# Patient Record
Sex: Female | Born: 1990 | Race: Black or African American | Hispanic: No | Marital: Single | Smoking: Never smoker
Health system: Southern US, Community
[De-identification: ages and names within clinical notes are randomized; demographics above are authoritative.]

## PROBLEM LIST (undated history)

## (undated) DIAGNOSIS — A749 Chlamydial infection, unspecified: Secondary | ICD-10-CM

## (undated) DIAGNOSIS — O139 Gestational [pregnancy-induced] hypertension without significant proteinuria, unspecified trimester: Secondary | ICD-10-CM

## (undated) DIAGNOSIS — N760 Acute vaginitis: Secondary | ICD-10-CM

## (undated) DIAGNOSIS — O24419 Gestational diabetes mellitus in pregnancy, unspecified control: Secondary | ICD-10-CM

## (undated) DIAGNOSIS — D649 Anemia, unspecified: Secondary | ICD-10-CM

## (undated) DIAGNOSIS — O009 Unspecified ectopic pregnancy without intrauterine pregnancy: Secondary | ICD-10-CM

## (undated) DIAGNOSIS — B9689 Other specified bacterial agents as the cause of diseases classified elsewhere: Secondary | ICD-10-CM

## (undated) HISTORY — DX: Other specified bacterial agents as the cause of diseases classified elsewhere: B96.89

## (undated) HISTORY — DX: Anemia, unspecified: D64.9

## (undated) HISTORY — DX: Gestational (pregnancy-induced) hypertension without significant proteinuria, unspecified trimester: O13.9

## (undated) HISTORY — PX: CYST EXCISION: SHX5701

## (undated) HISTORY — DX: Chlamydial infection, unspecified: A74.9

## (undated) HISTORY — PX: NO PAST SURGERIES: SHX2092

## (undated) HISTORY — DX: Unspecified ectopic pregnancy without intrauterine pregnancy: O00.90

## (undated) HISTORY — DX: Acute vaginitis: N76.0

## (undated) HISTORY — PX: WISDOM TOOTH EXTRACTION: SHX21

---
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
## 2008-02-19 ENCOUNTER — Emergency Department: Payer: Self-pay | Admitting: Emergency Medicine

## 2008-05-07 ENCOUNTER — Emergency Department: Payer: Self-pay | Admitting: Emergency Medicine

## 2009-07-13 ENCOUNTER — Emergency Department (HOSPITAL_COMMUNITY): Admission: EM | Admit: 2009-07-13 | Discharge: 2009-07-13 | Payer: Self-pay | Admitting: Emergency Medicine

## 2009-09-03 ENCOUNTER — Emergency Department: Payer: Self-pay | Admitting: Emergency Medicine

## 2009-10-26 ENCOUNTER — Emergency Department: Payer: Self-pay | Admitting: Unknown Physician Specialty

## 2009-11-19 ENCOUNTER — Emergency Department: Payer: Self-pay | Admitting: Unknown Physician Specialty

## 2012-08-26 ENCOUNTER — Emergency Department: Payer: Self-pay | Admitting: Emergency Medicine

## 2017-03-31 ENCOUNTER — Emergency Department
Admission: EM | Admit: 2017-03-31 | Discharge: 2017-04-01 | Disposition: A | Discharge status: Home / Self Care | Payer: Self-pay | Attending: Emergency Medicine | Admitting: Emergency Medicine

## 2017-03-31 ENCOUNTER — Encounter: Payer: Self-pay | Admitting: Emergency Medicine

## 2017-03-31 DIAGNOSIS — B9689 Other specified bacterial agents as the cause of diseases classified elsewhere: Secondary | ICD-10-CM | POA: Insufficient documentation

## 2017-03-31 DIAGNOSIS — N760 Acute vaginitis: Secondary | ICD-10-CM | POA: Insufficient documentation

## 2017-03-31 NOTE — ED Triage Notes (Signed)
Patient with complaint of vaginal irritation times two days. Patient states that she used monistat with no improvement.

## 2017-04-01 ENCOUNTER — Encounter: Payer: Self-pay | Admitting: Physician Assistant

## 2017-04-01 ENCOUNTER — Telehealth: Payer: Self-pay | Admitting: Emergency Medicine

## 2017-04-01 LAB — WET PREP, GENITAL
SPERM: NONE SEEN
TRICH WET PREP: NONE SEEN
YEAST WET PREP: NONE SEEN

## 2017-04-01 LAB — URINALYSIS, COMPLETE (UACMP) WITH MICROSCOPIC
Bacteria, UA: NONE SEEN
Bilirubin Urine: NEGATIVE
Glucose, UA: NEGATIVE mg/dL
Hgb urine dipstick: NEGATIVE
Ketones, ur: NEGATIVE mg/dL
Leukocytes, UA: NEGATIVE
Nitrite: NEGATIVE
Protein, ur: NEGATIVE mg/dL
Specific Gravity, Urine: 1.018 (ref 1.005–1.030)
pH: 7 (ref 5.0–8.0)

## 2017-04-01 LAB — CHLAMYDIA/NGC RT PCR (ARMC ONLY)
Chlamydia Tr: NOT DETECTED
N gonorrhoeae: NOT DETECTED

## 2017-04-01 MED ORDER — METRONIDAZOLE 500 MG PO TABS: 500.0000 mg | ORAL_TABLET | Freq: Two times a day (BID) | ORAL | 0 refills | Status: DC

## 2017-04-01 MED ORDER — METRONIDAZOLE 500 MG PO TABS
500.0000 mg | ORAL_TABLET | Freq: Once | ORAL | Status: AC
  Administered 2017-04-01: 500 mg via ORAL
  Filled 2017-04-01: qty 1

## 2017-04-01 NOTE — Telephone Encounter (Signed)
Patient called for std test results.  I gave her results.  Explained that she still needs to take the flagyl.  She says she will get that filled.

## 2017-04-01 NOTE — Discharge Instructions (Signed)
You are being treated for BV, it is a common condition in females, not considered to be a sexually transmitted infection. Take the prescription meds as directed. Do not drink alcohol for a week, while on this medicine. You may call back to confirm any remaining test results.

## 2017-04-01 NOTE — ED Provider Notes (Signed)
[facility] Emergency Department Provider Note ____________________________________________  Time seen: 0010  I have reviewed the triage vital signs and the nursing notes.  HISTORY  Chief Complaint  Vaginal Itching  HPI Alicia Terry is a 27 y.o. female sent to the ED for vaginal itching and irritation.  Patient has been using Monistat vaginal cream without any benefit.  She presents today for further evaluation and management patient denies any pelvic pain, abnormal vaginal bleeding, or vaginal discharge.  History reviewed. No pertinent past medical history.  There are no active problems to display for this patient.   History reviewed. No pertinent surgical history.  Prior to Admission medications   Medication Sig Start Date End Date Taking? Authorizing Provider  metroNIDAZOLE (FLAGYL) 500 MG tablet Take 1 tablet (500 mg total) by mouth 2 (two) times daily for 7 days. 04/01/17 04/08/17  Saharra Santo, Dannielle Karvonen, PA-C    Allergies Patient has no known allergies.  No family history on file.  Social History Social History   Tobacco Use  . Smoking status: Never Smoker  . Smokeless tobacco: Never Used  Substance Use Topics  . Alcohol use: Yes  . Drug use: No    Review of Systems  Constitutional: Negative for fever. Cardiovascular: Negative for chest pain. Respiratory: Negative for shortness of breath. Gastrointestinal: Negative for abdominal pain, vomiting and diarrhea. Genitourinary: Negative for dysuria. Vulvar irritation as above.  Musculoskeletal: Negative for back pain. Skin: Negative for rash. Neurological: Negative for headaches, focal weakness or numbness. ____________________________________________  PHYSICAL EXAM:  VITAL SIGNS: ED Triage Vitals [03/31/17 2344]  Enc Vitals Group     BP 137/76     Pulse Rate 74     Resp 18     Temp 97.8 F (36.6 C)     Temp Source Oral     SpO2 98 %     Weight 190 lb (86.2 kg)     Height 5'  8" (1.727 m)     Head Circumference      Peak Flow      Pain Score      Pain Loc      Pain Edu?      Excl. in [place]?     Constitutional: Alert and oriented. Well appearing and in no distress. Head: Normocephalic and atraumatic. Cardiovascular: Normal rate, regular rhythm. Normal distal pulses. Respiratory: Normal respiratory effort. No wheezes/rales/rhonchi. GU: Normal external genitalia. Residual vaginal cream in the cul de sac. Endocervical polyp noted in the os. No CMT or adnexal masses noted.  Musculoskeletal: Nontender with normal range of motion in all extremities.  Neurologic:  Normal gait without ataxia. Normal speech and language. No gross focal neurologic deficits are appreciated. Skin:  Skin is warm, dry and intact. No rash noted. Psychiatric: Mood and affect are normal. Patient exhibits appropriate insight and judgment. ____________________________________________   LABS (pertinent positives/negatives)  Labs Reviewed  WET PREP, GENITAL - Abnormal; Notable for the following components:      Result Value   Clue Cells Wet Prep HPF POC PRESENT (*)    WBC, Wet Prep HPF POC FEW (*)    All other components within normal limits  URINALYSIS, COMPLETE (UACMP) WITH MICROSCOPIC - Abnormal; Notable for the following components:   Color, Urine YELLOW (*)    APPearance CLEAR (*)    Squamous Epithelial / LPF 0-5 (*)    All other components within normal limits  CHLAMYDIA/NGC RT PCR ([facility] ONLY)  POC URINE PREG, ED  ____________________________________________  PROCEDURES  Procedures Flagyl 500 mg PO ____________________________________________  INITIAL IMPRESSION / ASSESSMENT AND PLAN / ED COURSE  Female patient with ED evaluation of vulvar irritation and itching.  She is failed a 2-day course of over-the-counter intravaginal yeast treatment.  Her wet prep confirms clue cells consistent with BV.  She will be discharged with a prescription for Flagyl.  She will follow-up  with [facility] or return to the ED as needed. ____________________________________________  FINAL CLINICAL IMPRESSION(S) / ED DIAGNOSES  Final diagnoses:  BV (bacterial vaginosis)      Suzanne Garbers, Dannielle Karvonen, PA-C 04/01/17 0100    Arta Silence, MD 04/02/17 2213

## 2017-04-01 NOTE — ED Notes (Signed)
POCT Results were NEGATIVE

## 2017-04-04 LAB — POCT PREGNANCY, URINE: Preg Test, Ur: NEGATIVE

## 2017-07-31 ENCOUNTER — Emergency Department: Payer: Self-pay

## 2017-07-31 ENCOUNTER — Emergency Department
Admission: EM | Admit: 2017-07-31 | Discharge: 2017-07-31 | Disposition: A | Discharge status: Home / Self Care | Payer: Self-pay | Attending: Emergency Medicine | Admitting: Emergency Medicine

## 2017-07-31 ENCOUNTER — Other Ambulatory Visit: Payer: Self-pay

## 2017-07-31 DIAGNOSIS — Y939 Activity, unspecified: Secondary | ICD-10-CM | POA: Insufficient documentation

## 2017-07-31 DIAGNOSIS — Y999 Unspecified external cause status: Secondary | ICD-10-CM | POA: Insufficient documentation

## 2017-07-31 DIAGNOSIS — W3400XA Accidental discharge from unspecified firearms or gun, initial encounter: Secondary | ICD-10-CM | POA: Insufficient documentation

## 2017-07-31 DIAGNOSIS — Z23 Encounter for immunization: Secondary | ICD-10-CM | POA: Insufficient documentation

## 2017-07-31 DIAGNOSIS — Y92513 Shop (commercial) as the place of occurrence of the external cause: Secondary | ICD-10-CM | POA: Insufficient documentation

## 2017-07-31 DIAGNOSIS — S81831A Puncture wound without foreign body, right lower leg, initial encounter: Secondary | ICD-10-CM | POA: Insufficient documentation

## 2017-07-31 LAB — POCT PREGNANCY, URINE: Preg Test, Ur: NEGATIVE

## 2017-07-31 MED ORDER — HYDROCODONE-ACETAMINOPHEN 5-325 MG PO TABS: 1.0000 | ORAL_TABLET | Freq: Four times a day (QID) | ORAL | 0 refills | Status: DC | PRN

## 2017-07-31 MED ORDER — TETANUS-DIPHTH-ACELL PERTUSSIS 5-2.5-18.5 LF-MCG/0.5 IM SUSP
0.5000 mL | Freq: Once | INTRAMUSCULAR | Status: AC
  Administered 2017-07-31: 0.5 mL via INTRAMUSCULAR
  Filled 2017-07-31: qty 0.5

## 2017-07-31 MED ORDER — LIDOCAINE-EPINEPHRINE 1 %-1:100000 IJ SOLN
10.0000 mL | Freq: Once | INTRAMUSCULAR | Status: AC
  Administered 2017-07-31: 1 mL via INTRADERMAL
  Filled 2017-07-31: qty 10
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
## 2017-11-17 ENCOUNTER — Encounter: Payer: Self-pay | Admitting: Emergency Medicine

## 2017-11-17 ENCOUNTER — Emergency Department
Admission: EM | Admit: 2017-11-17 | Discharge: 2017-11-17 | Disposition: A | Discharge status: Home / Self Care | Payer: Self-pay | Attending: Emergency Medicine | Admitting: Emergency Medicine

## 2017-11-17 ENCOUNTER — Other Ambulatory Visit: Payer: Self-pay

## 2017-11-17 DIAGNOSIS — B9689 Other specified bacterial agents as the cause of diseases classified elsewhere: Secondary | ICD-10-CM | POA: Insufficient documentation

## 2017-11-17 DIAGNOSIS — B3731 Acute candidiasis of vulva and vagina: Secondary | ICD-10-CM

## 2017-11-17 DIAGNOSIS — B373 Candidiasis of vulva and vagina: Secondary | ICD-10-CM

## 2017-11-17 DIAGNOSIS — N760 Acute vaginitis: Secondary | ICD-10-CM | POA: Insufficient documentation

## 2017-11-17 LAB — URINALYSIS, COMPLETE (UACMP) WITH MICROSCOPIC
Bacteria, UA: NONE SEEN
Bilirubin Urine: NEGATIVE
GLUCOSE, UA: NEGATIVE mg/dL
HGB URINE DIPSTICK: NEGATIVE
KETONES UR: NEGATIVE mg/dL
Nitrite: NEGATIVE
PH: 6 (ref 5.0–8.0)
Protein, ur: NEGATIVE mg/dL
Specific Gravity, Urine: 1.023 (ref 1.005–1.030)

## 2017-11-17 LAB — CHLAMYDIA/NGC RT PCR (ARMC ONLY)
Chlamydia Tr: DETECTED — AB
N GONORRHOEAE: NOT DETECTED

## 2017-11-17 LAB — WET PREP, GENITAL
Sperm: NONE SEEN
Trich, Wet Prep: NONE SEEN

## 2017-11-17 LAB — POCT PREGNANCY, URINE: Preg Test, Ur: NEGATIVE

## 2017-11-17 MED ORDER — FLUCONAZOLE 150 MG PO TABS: 150.0000 mg | ORAL_TABLET | Freq: Every day | ORAL | 0 refills | Status: DC

## 2017-11-17 MED ORDER — METRONIDAZOLE 500 MG PO TABS: 500.0000 mg | ORAL_TABLET | Freq: Two times a day (BID) | ORAL | 0 refills | Status: DC

## 2017-11-17 NOTE — ED Notes (Signed)
Pt reports that she attempted a vaginal cleansing and after that she feels pain and urgency with urination - she is also only able to void small amounts at a time

## 2017-11-17 NOTE — ED Triage Notes (Signed)
Pt sat over a "organic steam" that is supposed to cleanse you 2 days ago.  Since then her "vagina is not right" but is unable to describe further.  Did report went to used restroom and only urinated a little so thinks may have UTI.  Only pain is irritation to vagina.

## 2017-11-17 NOTE — ED Provider Notes (Signed)
[facility] Emergency Department Provider Note   ____________________________________________   First MD Initiated Contact with Patient 11/17/17 [phone]     (approximate)  I have reviewed the triage vital signs and the nursing notes.   HISTORY  Chief Complaint "vagina not normal"   HPI Alicia Terry is a 27 y.o. female presents to the ED with complaint of "vagina not right".  Patient reports that she has had irritation and occasional itching with a small amount of discharge.  Patient states that she used a "organic steam" that she sat over to cleanse her vagina 2 days ago.  Patient states that the ingredients contained orange peel and "all organic stuff" that was mixed into water and boiled until steaming.  She denies any urinary symptoms.  Is been no nausea or vomiting.  Currently she rates her discomfort as a 7 out of 10.   History reviewed. No pertinent past medical history.  There are no active problems to display for this patient.   History reviewed. No pertinent surgical history.  Prior to Admission medications   Medication Sig Start Date End Date Taking? Authorizing Provider  fluconazole (DIFLUCAN) 150 MG tablet Take 1 tablet (150 mg total) by mouth daily. 11/17/17   Johnn Hai, PA-C  metroNIDAZOLE (FLAGYL) 500 MG tablet Take 1 tablet (500 mg total) by mouth 2 (two) times daily. 11/17/17   Johnn Hai, PA-C    Allergies Patient has no known allergies.  History reviewed. No pertinent family history.  Social History Social History   Tobacco Use  . Smoking status: Never Smoker  . Smokeless tobacco: Never Used  Substance Use Topics  . Alcohol use: Yes  . Drug use: No    Review of Systems Constitutional: No fever/chills Cardiovascular: Denies chest pain. Respiratory: Denies shortness of breath. Gastrointestinal: No abdominal pain.  No nausea, no vomiting.  Genitourinary: Negative for dysuria.  Positive vaginal  irritation. Musculoskeletal: Negative for back pain. Skin: Negative for rash. Neurological: Negative for headaches, focal weakness or numbness. ____________________________________________   PHYSICAL EXAM:  VITAL SIGNS: ED Triage Vitals  Enc Vitals Group     BP 11/17/17 0929 (!) 149/103     Pulse Rate 11/17/17 0929 76     Resp 11/17/17 0929 18     Temp 11/17/17 0929 98.4 F (36.9 C)     Temp Source 11/17/17 0929 Oral     SpO2 11/17/17 0929 100 %     Weight 11/17/17 0930 190 lb (86.2 kg)     Height 11/17/17 0930 5\' 8"  (1.727 m)     Head Circumference --      Peak Flow --      Pain Score 11/17/17 0929 7     Pain Loc --      Pain Edu? --      Excl. in [place]? --    Constitutional: Alert and oriented. Well appearing and in no acute distress. Eyes: Conjunctivae are normal.  Head: Atraumatic. Nose: No congestion/rhinnorhea. Neck: No stridor.   Cardiovascular: Normal rate, regular rhythm. Grossly normal heart sounds.  Good peripheral circulation. Respiratory: Normal respiratory effort.  No retractions. Lungs CTAB. Gastrointestinal: Soft and nontender. No distention. Genitourinary: External genitalia has white thick vaginal secretions.  Vaginal wall contains white thick exudate.  Cervix is without erythema.  Cultures and wet prep were obtained.  No adnexal masses or tenderness is noted.  No chandelier sign present. Musculoskeletal: No lower extremity tenderness nor edema.  No joint effusions. Neurologic:  Normal speech and language. No gross focal neurologic deficits are appreciated. No gait instability. Skin:  Skin is warm, dry and intact. No rash noted. Psychiatric: Mood and affect are normal. Speech and behavior are normal.  ____________________________________________   LABS (all labs ordered are listed, but only abnormal results are displayed)  Labs Reviewed  WET PREP, GENITAL - Abnormal; Notable for the following components:      Result Value   Yeast Wet Prep HPF POC  PRESENT (*)    Clue Cells Wet Prep HPF POC PRESENT (*)    WBC, Wet Prep HPF POC MANY (*)    All other components within normal limits  URINALYSIS, COMPLETE (UACMP) WITH MICROSCOPIC - Abnormal; Notable for the following components:   Color, Urine YELLOW (*)    APPearance HAZY (*)    Leukocytes, UA MODERATE (*)    All other components within normal limits  CHLAMYDIA/NGC RT PCR ([facility] ONLY)  POC URINE PREG, ED  POCT PREGNANCY, URINE     PROCEDURES  Procedure(s) performed: None  Procedures  Critical Care performed: No  ____________________________________________   INITIAL IMPRESSION / ASSESSMENT AND PLAN / ED COURSE  As part of my medical decision making, I reviewed the following data within the electronic MEDICAL RECORD NUMBER Notes from prior ED visits and [place] Controlled Substance Database  Patient presents to the ED with complaint of vaginal irritation and discharge.  On exam there is noticeable discharge present.  Wet prep shows clue cells and yeast.  Patient was placed on Flagyl 500 mg twice daily for 7 days and Diflucan 150 mg 1 for yeast.  Patient was told that should her cultures come back positive she will be called.  At the time of discharge this test was still pending.  ____________________________________________   FINAL CLINICAL IMPRESSION(S) / ED DIAGNOSES  Final diagnoses:  Bacterial vaginosis  Yeast vaginitis     ED Discharge Orders         Ordered    metroNIDAZOLE (FLAGYL) 500 MG tablet  2 times daily     11/17/17 1101    fluconazole (DIFLUCAN) 150 MG tablet  Daily,   Status:  Discontinued     11/17/17 1101    fluconazole (DIFLUCAN) 150 MG tablet  Daily     11/17/17 1102           Note:  This document was prepared using Dragon voice recognition software and may include unintentional dictation errors.    Johnn Hai, PA-C 11/17/17 1123    Lisa Roca, MD 11/17/17 1239

## 2017-11-17 NOTE — Discharge Instructions (Signed)
Follow-up with your primary care provider or the health department if any continued vaginal problems.  Your medication was sent to [facility] on [address].  Take Diflucan for the yeast infection.  Flagyl is twice a day for 7 days for the bacterial vaginosis.  Do not drink alcohol with this medication as it will cause severe vomiting.  Discontinue the organic stain is this may have contributed to your yeast infection.

## 2017-11-17 NOTE — ED Notes (Signed)
This tech tried to print stickers for samples and bracelet would not scan

## 2017-11-18 ENCOUNTER — Telehealth: Payer: Self-pay | Admitting: Emergency Medicine

## 2017-11-18 NOTE — Telephone Encounter (Signed)
Called patient to notify patient of positive chlamydia test and need for treatment.  Per dr Clearnce Hasten can call in azithromycin 1 gram po.  Patient informed and called in rx to [facility] [address].  Also informed of partner treatment and free treatment at [facility].

## 2018-08-05 ENCOUNTER — Encounter: Payer: Self-pay | Admitting: Emergency Medicine

## 2018-08-05 ENCOUNTER — Other Ambulatory Visit: Payer: Self-pay

## 2018-08-05 ENCOUNTER — Emergency Department
Admission: EM | Admit: 2018-08-05 | Discharge: 2018-08-05 | Disposition: A | Discharge status: Home / Self Care | Payer: Medicaid Other | Attending: Emergency Medicine | Admitting: Emergency Medicine

## 2018-08-05 ENCOUNTER — Telehealth: Payer: Self-pay | Admitting: Obstetrics & Gynecology

## 2018-08-05 ENCOUNTER — Emergency Department: Payer: Medicaid Other

## 2018-08-05 DIAGNOSIS — R102 Pelvic and perineal pain: Secondary | ICD-10-CM | POA: Insufficient documentation

## 2018-08-05 DIAGNOSIS — Z3201 Encounter for pregnancy test, result positive: Secondary | ICD-10-CM | POA: Diagnosis not present

## 2018-08-05 DIAGNOSIS — R1030 Lower abdominal pain, unspecified: Secondary | ICD-10-CM | POA: Diagnosis present

## 2018-08-05 LAB — URINALYSIS, COMPLETE (UACMP) WITH MICROSCOPIC
Bacteria, UA: NONE SEEN
Bilirubin Urine: NEGATIVE
Glucose, UA: NEGATIVE mg/dL
Hgb urine dipstick: NEGATIVE
Ketones, ur: NEGATIVE mg/dL
Leukocytes,Ua: NEGATIVE
Nitrite: NEGATIVE
Protein, ur: NEGATIVE mg/dL
Specific Gravity, Urine: 1.024 (ref 1.005–1.030)
pH: 5 (ref 5.0–8.0)

## 2018-08-05 LAB — CBC
HCT: 38.9 % (ref 36.0–46.0)
Hemoglobin: 12.7 g/dL (ref 12.0–15.0)
MCH: 27.7 pg (ref 26.0–34.0)
MCHC: 32.6 g/dL (ref 30.0–36.0)
MCV: 84.9 fL (ref 80.0–100.0)
Platelets: 251 10*3/uL (ref 150–400)
RBC: 4.58 MIL/uL (ref 3.87–5.11)
RDW: 12.4 % (ref 11.5–15.5)
WBC: 4.8 10*3/uL (ref 4.0–10.5)
nRBC: 0 % (ref 0.0–0.2)

## 2018-08-05 LAB — COMPREHENSIVE METABOLIC PANEL
ALT: 15 U/L (ref 0–44)
AST: 17 U/L (ref 15–41)
Albumin: 4.4 g/dL (ref 3.5–5.0)
Alkaline Phosphatase: 28 U/L — ABNORMAL LOW (ref 38–126)
Anion gap: 9 (ref 5–15)
BUN: 11 mg/dL (ref 6–20)
CO2: 22 mmol/L (ref 22–32)
Calcium: 9.1 mg/dL (ref 8.9–10.3)
Chloride: 107 mmol/L (ref 98–111)
Creatinine, Ser: 0.81 mg/dL (ref 0.44–1.00)
GFR calc Af Amer: >60 mL/min (ref >60)
GFR calc non Af Amer: >60 mL/min (ref >60)
Glucose, Bld: 104 mg/dL — ABNORMAL HIGH (ref 70–99)
Potassium: 3.7 mmol/L (ref 3.5–5.1)
Sodium: 138 mmol/L (ref 135–145)
Total Bilirubin: 0.7 mg/dL (ref 0.3–1.2)
Total Protein: 7.3 g/dL (ref 6.5–8.1)

## 2018-08-05 LAB — HCG, QUANTITATIVE, PREGNANCY: hCG, Beta Chain, Quant, S: 685 m[IU]/mL — ABNORMAL HIGH (ref <5)

## 2018-08-05 LAB — LIPASE, BLOOD: Lipase: 26 U/L (ref 11–51)

## 2018-08-05 LAB — POCT PREGNANCY, URINE: Preg Test, Ur: POSITIVE — AB

## 2018-08-05 MED ORDER — DOXYLAMINE-PYRIDOXINE 10-10 MG PO TBEC: 2.0000 | DELAYED_RELEASE_TABLET | Freq: Every day | ORAL | 0 refills | Status: DC

## 2018-08-05 MED ORDER — SODIUM CHLORIDE 0.9% FLUSH: 3.0000 mL | Freq: Once | INTRAVENOUS | Status: DC

## 2018-08-05 MED ORDER — ACETAMINOPHEN 500 MG PO TABS
1000.0000 mg | ORAL_TABLET | ORAL | Status: AC
  Administered 2018-08-05: 1000 mg via ORAL
  Filled 2018-08-05: qty 2

## 2018-08-05 NOTE — Discharge Instructions (Signed)
You were seen in the emergency room for abdominal pain. It is important that you follow up closely with Dr. Kenton Kingfisher at [place] this Thursday.  Call to make an appointment.  Please return to the emergency room right away if you are to develop a fever, vaginal bleeding, severe nausea, your pain becomes severe or worsens, you are unable to keep food down, begin vomiting any dark or bloody fluid, you develop any dark or bloody stools, feel dehydrated, or other new concerns or symptoms arise.

## 2018-08-05 NOTE — Telephone Encounter (Signed)
Patient is schedule at 1:20 on Thursday,08/07/18 with [facility]

## 2018-08-05 NOTE — ED Provider Notes (Signed)
[facility] Emergency Department Provider Note   ____________________________________________   First MD Initiated Contact with Patient 08/05/18 [phone]     (approximate)  I have reviewed the triage vital signs and the nursing notes.   HISTORY  Chief Complaint Abdominal Pain    HPI Alicia Terry is a 28 y.o. female here for evaluation for abdominal discomfort.  Patient reports she has been just not feeling quite herself for couple weeks now.  She is had a little bit of slight nausea, fatigue, and a little bit of abdominal discomfort.  She started noticing a bit of a crampy feeling in the mid lower abdomen starting yesterday as well.  She missed her last menstrual cycle which is normally extremely regular and was due a couple days ago, and took a pregnancy test at home that was positive   Patient reports she had a pelvic exam and STD tests done 1 week ago [facility].  She had the evaluation as she was having hygiene done by a spa when they told her that they thought she might have some type of bacterial vaginosis  She was treated with medication for bacterial vaginosis, she had 1 day of medicine left to take when she found out pregnant so she stopped it.  She does not have any symptoms that she knows of  History reviewed. No pertinent past medical history.  There are no active problems to display for this patient.   No past surgical history on file.  Prior to Admission medications   Medication Sig Start Date End Date Taking? Authorizing Provider  ferrous sulfate 325 (65 FE) MG tablet Take 325 mg by mouth daily as needed (for low iron (sluggishness)).   Yes [provider]  Multiple Vitamins-Calcium (ONE-A-DAY WOMENS PO) Take 1 tablet by mouth daily.   Yes [provider]  Multiple Vitamins-Minerals (HAIR SKIN AND NAILS FORMULA) TABS Take 1 tablet by mouth daily.   Yes [provider]  Doxylamine-Pyridoxine 10-10  MG TBEC Take 2 tablets by mouth at bedtime. 08/05/18   Delman Kitten, MD    Allergies Patient has no known allergies.  No family history on file.  Social History Social History   Tobacco Use   Smoking status: Never Smoker   Smokeless tobacco: Never Used  Substance Use Topics   Alcohol use: Yes   Drug use: No    Review of Systems Constitutional: No fever/chills Eyes: No visual changes. ENT: No sore throat. Cardiovascular: Denies chest pain. Respiratory: Denies shortness of breath. Gastrointestinal: No severe abdominal pain, some mild discomfort somewhat crampy comes and goes located in the mid lower abdomen.  Denies pain in the right lower quadrant.  No pain under the right ribs. Genitourinary: Negative for dysuria. Musculoskeletal: Negative for back pain. Skin: Negative for rash. Neurological: Negative for headaches, areas of focal weakness or numbness.    ____________________________________________   PHYSICAL EXAM:  VITAL SIGNS: ED Triage Vitals  Enc Vitals Group     BP 08/05/18 0756 123/80     Pulse Rate 08/05/18 0756 71     Resp 08/05/18 0756 16     Temp 08/05/18 0756 98.6 F (37 C)     Temp Source 08/05/18 0756 Oral     SpO2 08/05/18 0756 100 %     Weight --      Height 08/05/18 0756 5\' 8"  (1.727 m)     Head Circumference --      Peak Flow --  Pain Score 08/05/18 0801 10     Pain Loc --      Pain Edu? --      Excl. in [place]? --     Constitutional: Alert and oriented. Well appearing and in no acute distress. Eyes: Conjunctivae are normal. Head: Atraumatic. Nose: No congestion/rhinnorhea. Mouth/Throat: Mucous membranes are moist. Neck: No stridor.  Cardiovascular: Normal rate, regular rhythm. Grossly normal heart sounds.  Good peripheral circulation. Respiratory: Normal respiratory effort.  No retractions. Lungs CTAB. Gastrointestinal: Soft and nontender. No distention. Musculoskeletal: No lower extremity tenderness nor edema. Neurologic:   Normal speech and language. No gross focal neurologic deficits are appreciated.  Skin:  Skin is warm, dry and intact. No rash noted. Psychiatric: Mood and affect are normal. Speech and behavior are normal.  ____________________________________________   LABS (all labs ordered are listed, but only abnormal results are displayed)  Labs Reviewed  COMPREHENSIVE METABOLIC PANEL - Abnormal; Notable for the following components:      Result Value   Glucose, Bld 104 (*)    Alkaline Phosphatase 28 (*)    All other components within normal limits  URINALYSIS, COMPLETE (UACMP) WITH MICROSCOPIC - Abnormal; Notable for the following components:   Color, Urine YELLOW (*)    APPearance HAZY (*)    All other components within normal limits  HCG, QUANTITATIVE, PREGNANCY - Abnormal; Notable for the following components:   hCG, Beta Chain, Quant, S 685 (*)    All other components within normal limits  POCT PREGNANCY, URINE - Abnormal; Notable for the following components:   Preg Test, Ur POSITIVE (*)    All other components within normal limits  LIPASE, BLOOD  CBC  POC URINE PREG, ED   ____________________________________________  EKG   ____________________________________________  RADIOLOGY  US Ob Comp < 14 Wks  Result Date: 08/05/2018 CLINICAL DATA:  Pelvic pain and cramping with positive beta HCG EXAM: OBSTETRIC <14 WK [place] AND TRANSVAGINAL OB US TECHNIQUE: Both transabdominal and transvaginal ultrasound examinations were performed for complete evaluation of the gestation as well as the maternal uterus, adnexal regions, and pelvic cul-de-sac. Transvaginal technique was performed to assess early pregnancy. COMPARISON:  None. FINDINGS: Intrauterine gestational sac: Not visualized Yolk sac:  Not visualized Embryo:  Not visualized Cardiac Activity: Not visualized Subchorionic hemorrhage:  None visualized. Maternal uterus/adnexae: There is a hypoechoic mass consistent with a leiomyoma within the  superior aspect of the uterine fundus measuring 1.3 x 1.4 x 1.5 cm. A suspected second leiomyoma in the superior uterus measures 1.6 x 1.5 x 2.0 cm. The endometrium is mildly thickened with a smooth contour. The left ovary appears unremarkable, measuring 2.5 x 1.7 x 1.6 cm. Right ovary measures 2.6 x 2.2 x 2.3 cm. There is a mildly complex cystic area in the right ovary, a likely hemorrhagic corpus luteum. There is apparent thickening in the right fallopian tube region. There is a small amount of free pelvic fluid. IMPRESSION: 1. Currently there is no demonstrable gestational sac or fetal pole. Given positive beta HCG, differential considerations in this circumstance include intrauterine gestation too early to be visualized by either transabdominal or transvaginal technique; recent spontaneous abortion; possible ectopic gestation. This circumstance warrants close clinical and laboratory surveillance with timing of repeat ultrasound in part based on beta HCG values going forward. 2.  Evidence of intrauterine leiomyomas. 3.  Probable hemorrhagic corpus luteum on the right. 4. The right fallopian tube appears prominent and mildly thickened. Significance of this finding uncertain. Possibility of infection within this  fallopian tube must be a consideration. Particular attention this area on subsequent sonographic evaluation advised. 5. Small amount of fluid in cul-de-sac. Question recent ovarian cyst leakage/rupture. Electronically Signed   By: Lowella Grip III M.D.   On: 08/05/2018 10:21   US Ob Transvaginal  Result Date: 08/05/2018 CLINICAL DATA:  Pelvic pain and cramping with positive beta HCG EXAM: OBSTETRIC <14 WK [place] AND TRANSVAGINAL OB US TECHNIQUE: Both transabdominal and transvaginal ultrasound examinations were performed for complete evaluation of the gestation as well as the maternal uterus, adnexal regions, and pelvic cul-de-sac. Transvaginal technique was performed to assess early pregnancy.  COMPARISON:  None. FINDINGS: Intrauterine gestational sac: Not visualized Yolk sac:  Not visualized Embryo:  Not visualized Cardiac Activity: Not visualized Subchorionic hemorrhage:  None visualized. Maternal uterus/adnexae: There is a hypoechoic mass consistent with a leiomyoma within the superior aspect of the uterine fundus measuring 1.3 x 1.4 x 1.5 cm. A suspected second leiomyoma in the superior uterus measures 1.6 x 1.5 x 2.0 cm. The endometrium is mildly thickened with a smooth contour. The left ovary appears unremarkable, measuring 2.5 x 1.7 x 1.6 cm. Right ovary measures 2.6 x 2.2 x 2.3 cm. There is a mildly complex cystic area in the right ovary, a likely hemorrhagic corpus luteum. There is apparent thickening in the right fallopian tube region. There is a small amount of free pelvic fluid. IMPRESSION: 1. Currently there is no demonstrable gestational sac or fetal pole. Given positive beta HCG, differential considerations in this circumstance include intrauterine gestation too early to be visualized by either transabdominal or transvaginal technique; recent spontaneous abortion; possible ectopic gestation. This circumstance warrants close clinical and laboratory surveillance with timing of repeat ultrasound in part based on beta HCG values going forward. 2.  Evidence of intrauterine leiomyomas. 3.  Probable hemorrhagic corpus luteum on the right. 4. The right fallopian tube appears prominent and mildly thickened. Significance of this finding uncertain. Possibility of infection within this fallopian tube must be a consideration. Particular attention this area on subsequent sonographic evaluation advised. 5. Small amount of fluid in cul-de-sac. Question recent ovarian cyst leakage/rupture. Electronically Signed   By: Lowella Grip III M.D.   On: 08/05/2018 10:21    ____________________________________________   PROCEDURES  Procedure(s) performed: None  Procedures  Critical Care performed:  No  ____________________________________________   INITIAL IMPRESSION / ASSESSMENT AND PLAN / ED COURSE  Pertinent labs & imaging results that were available during my care of the patient were reviewed by me and considered in my medical decision making (see chart for details).   Very reassuring abdominal exam.  No fever.  Normal white count.  Reassuring lab work.  Negative Murphy no pain at McBurney's point.  Find no evidence of acute abdomen.  Also feel low risk for ectopic or at least for ruptured ectopic based on my clinical examination.  She does report a slight crampiness in the suprapubic region.  No associated discharge or bleeding.  Some nausea for at least the last week or 2 and I suspect many of the symptoms attributed to likely early pregnancy.  Reassuring exam.  Will obtain transvaginal ultrasound to further work-up, her hCG is less than the discriminate zone  Clinical Course as of Aug 04 1052  Tue Aug 05, 2018  1039 Reviewed history and ultrasound / clnical history with Dr. Aline Brochure. Would like to see Thursday in clinic for a repeat quant and visit follow-up.    [MQ]  1040 Discussed all ultrasound findings with  Dr. Aline Brochure. Patient is awake, alert, well oriented. Non-toxic in appearance.   [MQ]    Clinical Course User Index [MQ] Delman Kitten, MD   ----------------------------------------- 10:53 AM on 08/05/2018 -----------------------------------------  Patient reports her pain is better.  She denies any pain now.  Tylenol worked well.  Denies ongoing pain or discomfort, well-appearing.  Ultrasound reviewed and discussed carefully with OB/GYN along with her clinical history.  I think at this point is reasonable to have her follow-up Thursday at the OB/GYN clinic for repeat hCG and evaluation.  Discussed with the patient, she is comfortable with this plan as well.  Careful return precautions discussed patient agreeable.  Clinically I doubt evidence of significant or severe  ectopic, is appears most likely consistent with corpus luteal cyst possibly with a small amount of rupture causing some of her discomfort.  Her pain is well controlled and she has appropriate follow-up and I discussed careful return precautions with her.  Alicia Terry was evaluated in Emergency Department on 08/05/2018 for the symptoms described in the history of present illness. She was evaluated in the context of the global COVID-19 pandemic, which necessitated consideration that the patient might be at risk for infection with the SARS-CoV-2 virus that causes COVID-19. Institutional protocols and algorithms that pertain to the evaluation of patients at risk for COVID-19 are in a state of rapid change based on information released by regulatory bodies including the CDC and federal and state organizations. These policies and algorithms were followed during the patient's care in the ED.  Return precautions and treatment recommendations and follow-up discussed with the patient who is agreeable with the plan.   ____________________________________________   FINAL CLINICAL IMPRESSION(S) / ED DIAGNOSES  Final diagnoses:  Positive pregnancy test  Lower abdominal pain, unspecified        Note:  This document was prepared using Dragon voice recognition software and may include unintentional dictation errors       Delman Kitten, MD 08/05/18 1055

## 2018-08-05 NOTE — ED Notes (Signed)
Pt reports that she just found out she was pregnant on the 23rd - today she started with mild cramping in lower abd without bleeding - she denies urinary sxs or any reason to believe that she has STI

## 2018-08-05 NOTE — ED Triage Notes (Addendum)
Pt reports mid abdominal pain x1 days, denies n/v/d. Pt reports positive pregnancy test yesterday, last period was May 26th. Pt reports she was recently dx with BV and had 2 pills left to take, quit taking because she found out she was pregnant.

## 2018-08-05 NOTE — Telephone Encounter (Signed)
-----   Message from Gae Dry, MD sent at 08/05/2018 10:40 AM EDT ----- Regarding: ER follow up Thurs w me or anyone, must be this day

## 2018-08-07 ENCOUNTER — Encounter: Payer: Self-pay | Admitting: Obstetrics & Gynecology

## 2018-08-07 ENCOUNTER — Ambulatory Visit (INDEPENDENT_AMBULATORY_CARE_PROVIDER_SITE_OTHER): Payer: Medicaid Other | Admitting: Obstetrics & Gynecology

## 2018-08-07 ENCOUNTER — Other Ambulatory Visit: Payer: Self-pay

## 2018-08-07 VITALS — BP 100/60 | Ht 68.0 in | Wt 211.0 lb

## 2018-08-07 DIAGNOSIS — N926 Irregular menstruation, unspecified: Secondary | ICD-10-CM

## 2018-08-07 DIAGNOSIS — R1030 Lower abdominal pain, unspecified: Secondary | ICD-10-CM | POA: Diagnosis not present

## 2018-08-07 NOTE — Patient Instructions (Signed)
First Trimester of Pregnancy The first trimester of pregnancy is from week 1 until the end of week 13 (months 1 through 3). A week after a sperm fertilizes an egg, the egg will implant on the wall of the uterus. This embryo will begin to develop into a baby. Genes from you and your partner will form the baby. The female genes will determine whether the baby will be a boy or a girl. At 6-8 weeks, the eyes and face will be formed, and the heartbeat can be seen on ultrasound. At the end of 12 weeks, all the baby's organs will be formed. Now that you are pregnant, you will want to do everything you can to have a healthy baby. Two of the most important things are to get good prenatal care and to follow your health care provider's instructions. Prenatal care is all the medical care you receive before the baby's birth. This care will help prevent, find, and treat any problems during the pregnancy and childbirth. Body changes during your first trimester Your body goes through many changes during pregnancy. The changes vary from woman to woman.  You may gain or lose a couple of pounds at first.  You may feel sick to your stomach (nauseous) and you may throw up (vomit). If the vomiting is uncontrollable, call your health care provider.  You may tire easily.  You may develop headaches that can be relieved by medicines. All medicines should be approved by your health care provider.  You may urinate more often. Painful urination may mean you have a bladder infection.  You may develop heartburn as a result of your pregnancy.  You may develop constipation because certain hormones are causing the muscles that push stool through your intestines to slow down.  You may develop hemorrhoids or swollen veins (varicose veins).  Your breasts may begin to grow larger and become tender. Your nipples may stick out more, and the tissue that surrounds them (areola) may become darker.  Your gums may bleed and may be  sensitive to brushing and flossing.  Dark spots or blotches (chloasma, mask of pregnancy) may develop on your face. This will likely fade after the baby is born.  Your menstrual periods will stop.  You may have a loss of appetite.  You may develop cravings for certain kinds of food.  You may have changes in your emotions from day to day, such as being excited to be pregnant or being concerned that something may go wrong with the pregnancy and baby.  You may have more vivid and strange dreams.  You may have changes in your hair. These can include thickening of your hair, rapid growth, and changes in texture. Some women also have hair loss during or after pregnancy, or hair that feels dry or thin. Your hair will most likely return to normal after your baby is born. What to expect at prenatal visits During a routine prenatal visit:  You will be weighed to make sure you and the baby are growing normally.  Your blood pressure will be taken.  Your abdomen will be measured to track your baby's growth.  The fetal heartbeat will be listened to between weeks 10 and 14 of your pregnancy.  Test results from any previous visits will be discussed. Your health care provider may ask you:  How you are feeling.  If you are feeling the baby move.  If you have had any abnormal symptoms, such as leaking fluid, bleeding, severe headaches, or abdominal   cramping.  If you are using any tobacco products, including cigarettes, chewing tobacco, and electronic cigarettes.  If you have any questions. Other tests that may be performed during your first trimester include:  Blood tests to find your blood type and to check for the presence of any previous infections. The tests will also be used to check for low iron levels (anemia) and protein on red blood cells (Rh antibodies). Depending on your risk factors, or if you previously had diabetes during pregnancy, you may have tests to check for high blood sugar  that affects pregnant women (gestational diabetes).  Urine tests to check for infections, diabetes, or protein in the urine.  An ultrasound to confirm the proper growth and development of the baby.  Fetal screens for spinal cord problems (spina bifida) and Down syndrome.  HIV (human immunodeficiency virus) testing. Routine prenatal testing includes screening for HIV, unless you choose not to have this test.  You may need other tests to make sure you and the baby are doing well. Follow these instructions at home: Medicines  Follow your health care provider's instructions regarding medicine use. Specific medicines may be either safe or unsafe to take during pregnancy.  Take a prenatal vitamin that contains at least 600 micrograms (mcg) of folic acid.  If you develop constipation, try taking a stool softener if your health care provider approves. Eating and drinking   Eat a balanced diet that includes fresh fruits and vegetables, whole grains, good sources of protein such as meat, eggs, or tofu, and low-fat dairy. Your health care provider will help you determine the amount of weight gain that is right for you.  Avoid raw meat and uncooked cheese. These carry germs that can cause birth defects in the baby.  Eating four or five small meals rather than three large meals a day may help relieve nausea and vomiting. If you start to feel nauseous, eating a few soda crackers can be helpful. Drinking liquids between meals, instead of during meals, also seems to help ease nausea and vomiting.  Limit foods that are high in fat and processed sugars, such as fried and sweet foods.  To prevent constipation: ? Eat foods that are high in fiber, such as fresh fruits and vegetables, whole grains, and beans. ? Drink enough fluid to keep your urine clear or pale yellow. Activity  Exercise only as directed by your health care provider. Most women can continue their usual exercise routine during  pregnancy. Try to exercise for 30 minutes at least 5 days a week. Exercising will help you: ? Control your weight. ? Stay in shape. ? Be prepared for labor and delivery.  Experiencing pain or cramping in the lower abdomen or lower back is a good sign that you should stop exercising. Check with your health care provider before continuing with normal exercises.  Try to avoid standing for long periods of time. Move your legs often if you must stand in one place for a long time.  Avoid heavy lifting.  Wear low-heeled shoes and practice good posture.  You may continue to have sex unless your health care provider tells you not to. Relieving pain and discomfort  Wear a good support bra to relieve breast tenderness.  Take warm sitz baths to soothe any pain or discomfort caused by hemorrhoids. Use hemorrhoid cream if your health care provider approves.  Rest with your legs elevated if you have leg cramps or low back pain.  If you develop varicose veins in   your legs, wear support hose. Elevate your feet for 15 minutes, 3-4 times a day. Limit salt in your diet. Prenatal care  Schedule your prenatal visits by the twelfth week of pregnancy. They are usually scheduled monthly at first, then more often in the last 2 months before delivery.  Write down your questions. Take them to your prenatal visits.  Keep all your prenatal visits as told by your health care provider. This is important. Safety  Wear your seat belt at all times when driving.  Make a list of emergency phone numbers, including numbers for family, friends, the hospital, and police and fire departments. General instructions  Ask your health care provider for a referral to a local prenatal education class. Begin classes no later than the beginning of month 6 of your pregnancy.  Ask for help if you have counseling or nutritional needs during pregnancy. Your health care provider can offer advice or refer you to specialists for help  with various needs.  Do not use hot tubs, steam rooms, or saunas.  Do not douche or use tampons or scented sanitary pads.  Do not cross your legs for long periods of time.  Avoid cat litter boxes and soil used by cats. These carry germs that can cause birth defects in the baby and possibly loss of the fetus by miscarriage or stillbirth.  Avoid all smoking, herbs, alcohol, and medicines not prescribed by your health care provider. Chemicals in these products affect the formation and growth of the baby.  Do not use any products that contain nicotine or tobacco, such as cigarettes and e-cigarettes. If you need help quitting, ask your health care provider. You may receive counseling support and other resources to help you quit.  Schedule a dentist appointment. At home, brush your teeth with a soft toothbrush and be gentle when you floss. Contact a health care provider if:  You have dizziness.  You have mild pelvic cramps, pelvic pressure, or nagging pain in the abdominal area.  You have persistent nausea, vomiting, or diarrhea.  You have a bad smelling vaginal discharge.  You have pain when you urinate.  You notice increased swelling in your face, hands, legs, or ankles.  You are exposed to fifth disease or chickenpox.  You are exposed to German measles (rubella) and have never had it. Get help right away if:  You have a fever.  You are leaking fluid from your vagina.  You have spotting or bleeding from your vagina.  You have severe abdominal cramping or pain.  You have rapid weight gain or loss.  You vomit blood or material that looks like coffee grounds.  You develop a severe headache.  You have shortness of breath.  You have any kind of trauma, such as from a fall or a car accident. Summary  The first trimester of pregnancy is from week 1 until the end of week 13 (months 1 through 3).  Your body goes through many changes during pregnancy. The changes vary from  woman to woman.  You will have routine prenatal visits. During those visits, your health care provider will examine you, discuss any test results you may have, and talk with you about how you are feeling. This information is not intended to replace advice given to you by your health care provider. Make sure you discuss any questions you have with your health care provider. Document Released: 01/16/2001 Document Revised: 01/04/2017 Document Reviewed: 01/04/2016 [employer] Patient Education  2020 [employer]  

## 2018-08-07 NOTE — Progress Notes (Signed)
  Amenorrhea and lower abdominal pain Patient is a 28 yo G6P0 AA F who presents for evaluation of missing a period followed by lower abdominal pains and mild nausea; pain is lower mid abdomen without radiation or modifiers; mild; seen in ER 2 days ago w beta 600 and [place] without evidence for pregnancy yet.  Currently periods were occurring every regularly 28 days. Bleeding is currently none; one episode of brownish d/c. Periods were regular in the past occurring every 28 days. Denies FH of birth defects or menstrual abnormalities.  History of anemia, takes iron.  PMHx: She  has no past medical history on file. Also,  has no past surgical history on file., family history includes Diabetes in her mother; Hypertension in her mother.,  reports that she has never smoked. She has never used smokeless tobacco. She reports current alcohol use. She reports that she does not use drugs.  She has a current medication list which includes the following prescription(s): doxylamine-pyridoxine, ferrous sulfate, multiple vitamins-calcium, and hair skin and nails formula. Also, has No Known Allergies.  Review of Systems  Constitutional: Negative for chills, fever and malaise/fatigue.  HENT: Negative for congestion, sinus pain and sore throat.   Eyes: Negative for blurred vision and pain.  Respiratory: Negative for cough and wheezing.   Cardiovascular: Negative for chest pain and leg swelling.  Gastrointestinal: Negative for abdominal pain, constipation, diarrhea, heartburn, nausea and vomiting.  Genitourinary: Negative for dysuria, frequency, hematuria and urgency.  Musculoskeletal: Negative for back pain, joint pain, myalgias and neck pain.  Skin: Negative for itching and rash.  Neurological: Negative for dizziness, tremors and weakness.  Endo/Heme/Allergies: Does not bruise/bleed easily.  Psychiatric/Behavioral: Negative for depression. The patient is not nervous/anxious and does not have insomnia.      Objective: BP 100/60   Ht 5\' 8"  (1.727 m)   Wt 211 lb (95.7 kg)   LMP 06/30/2018   BMI 32.08 kg/m  Physical Exam Constitutional:      General: She is not in acute distress.    Appearance: She is well-developed.  Musculoskeletal: Normal range of motion.  Neurological:     Mental Status: She is alert and oriented to person, place, and time.  Skin:    General: Skin is warm and dry.  Vitals signs reviewed.     ASSESSMENT/PLAN:   Problem List Items Addressed This Visit      Other   Abdominal pain, lower - Primary   Relevant Orders   Beta hCG quant (ref lab)   US OB LESS THAN 14 WEEKS WITH OB TRANSVAGINAL   Missed period       Relevant Orders   Beta hCG quant (ref lab)   US OB LESS THAN 14 WEEKS WITH OB TRANSVAGINAL    Assess for early pregnancy, miscarriage, ectopic, other. PNV, also cont iron due to history of anemia [place] 10 days and if betas rise.  Return sooner based on sx's. Plan NOB nv.  Barnett Applebaum, MD, [facility], [facility] 08/07/2018  1:36 PM

## 2018-08-08 LAB — BETA HCG QUANT (REF LAB): hCG Quant: 1225 m[IU]/mL

## 2018-08-18 ENCOUNTER — Encounter: Payer: Self-pay | Admitting: Physician Assistant

## 2018-08-19 ENCOUNTER — Ambulatory Visit (INDEPENDENT_AMBULATORY_CARE_PROVIDER_SITE_OTHER): Payer: Medicaid Other | Admitting: Obstetrics and Gynecology

## 2018-08-19 ENCOUNTER — Other Ambulatory Visit: Payer: Self-pay

## 2018-08-19 ENCOUNTER — Ambulatory Visit: Admission: RE | Admit: 2018-08-19 | Payer: Medicaid Other | Source: Ambulatory Visit

## 2018-08-19 ENCOUNTER — Encounter: Payer: Self-pay | Admitting: Obstetrics and Gynecology

## 2018-08-19 ENCOUNTER — Ambulatory Visit (INDEPENDENT_AMBULATORY_CARE_PROVIDER_SITE_OTHER): Payer: Medicaid Other

## 2018-08-19 ENCOUNTER — Other Ambulatory Visit: Payer: Self-pay | Admitting: Obstetrics and Gynecology

## 2018-08-19 VITALS — BP 118/78 | Wt 213.0 lb

## 2018-08-19 DIAGNOSIS — N83201 Unspecified ovarian cyst, right side: Secondary | ICD-10-CM

## 2018-08-19 DIAGNOSIS — R1030 Lower abdominal pain, unspecified: Secondary | ICD-10-CM

## 2018-08-19 DIAGNOSIS — O3680X0 Pregnancy with inconclusive fetal viability, not applicable or unspecified: Secondary | ICD-10-CM | POA: Diagnosis not present

## 2018-08-19 DIAGNOSIS — N8311 Corpus luteum cyst of right ovary: Secondary | ICD-10-CM

## 2018-08-19 DIAGNOSIS — N926 Irregular menstruation, unspecified: Secondary | ICD-10-CM

## 2018-08-19 DIAGNOSIS — O3481 Maternal care for other abnormalities of pelvic organs, first trimester: Secondary | ICD-10-CM

## 2018-08-19 NOTE — Progress Notes (Signed)
Gynecology Ultrasound Follow Up   Chief Complaint  Patient presents with  . Follow-up    Ultrasound  for pregnancy dating/viability  History of Present Illness: Patient is a 28 y.o. female who presents today for ultrasound evaluation of the above.  Ultrasound demonstrates the following findings Adnexa: right side with concern for ectopic pregnancy. No fetal pole or fetal heart rate noted. Uterus: no IUP noted  Additional: free fluid in pelvis.  She has no pain or bleeding today. HCG trend: 6/30: 685 7/2: 1,225  Past Medical History:  Diagnosis Date  . No known health problems     Past Surgical History:  Procedure Laterality Date  . NO PAST SURGERIES      Family History  Problem Relation Age of Onset  . Diabetes Mother   . Hypertension Mother     Social History   Socioeconomic History  . Marital status: Single    Spouse name: Not on file  . Number of children: Not on file  . Years of education: Not on file  . Highest education level: Not on file  Occupational History  . Not on file  Social Needs  . Financial resource strain: Not on file  . Food insecurity    Worry: Not on file    Inability: Not on file  . Transportation needs    Medical: Not on file    Non-medical: Not on file  Tobacco Use  . Smoking status: Never Smoker  . Smokeless tobacco: Never Used  Substance and Sexual Activity  . Alcohol use: Yes  . Drug use: No  . Sexual activity: Not Currently    Birth control/protection: None  Lifestyle  . Physical activity    Days per week: Not on file    Minutes per session: Not on file  . Stress: Not on file  Relationships  . Social Herbalist on phone: Not on file    Gets together: Not on file    Attends religious service: Not on file    Active member of club or organization: Not on file    Attends meetings of clubs or organizations: Not on file    Relationship status: Not on file  . Intimate partner violence    Fear of current or  ex partner: Not on file    Emotionally abused: Not on file    Physically abused: Not on file    Forced sexual activity: Not on file  Other Topics Concern  . Not on file  Social History Narrative  . Not on file    No Known Allergies  Prior to Admission medications   Medication Sig Start Date End Date Taking? Authorizing Provider  Doxylamine-Pyridoxine 10-10 MG TBEC Take 2 tablets by mouth at bedtime. Patient not taking: Reported on 08/07/2018 08/05/18   Delman Kitten, MD  ferrous sulfate 325 (65 FE) MG tablet Take 325 mg by mouth daily as needed (for low iron (sluggishness)).    [provider]  Multiple Vitamins-Calcium (ONE-A-DAY WOMENS PO) Take 1 tablet by mouth daily.    [provider]  Multiple Vitamins-Minerals (HAIR SKIN AND NAILS FORMULA) TABS Take 1 tablet by mouth daily.    [provider]   Physical Exam BP 118/78   Wt 213 lb (96.6 kg)   LMP 06/30/2018   BMI 32.39 kg/m    General: NAD HEENT: normocephalic, anicteric Pulmonary: No increased work of breathing Extremities: no edema, erythema, or tenderness Neurologic: Grossly intact, normal gait Psychiatric: mood  appropriate, affect full  Imaging Results US Ob Less Than 14 Weeks With Ob Transvaginal  Result Date: 08/19/2018 Patient Name: Alicia Terry DOB: 1990-09-03 MRN: [id number] ULTRASOUND REPORT Location: [facility] Date of Service: 08/19/2018 Indications:dating Findings: No intrauterine pregnancy is seen. There is an ectopic pregnancy seen in the right adnexa measuring 21.0 x 8.8 x 15.0 mm. No gestational sac, yolk sac, or fetal pole is seen. Thick, homogeneous endometrium. There are two uterine fibroids see. The fibroids is right subserosal measuring 21.8 x 13.6 x 16.6 mm. The second is right pedunculated measuring 24.2 x 16.8 x 20.9 mm. Right Ovary is normal in appearance. Left Ovary is normal appearance. Corpus luteal cyst:  Right ovary There is a simple cyst in the right adnexa  adjacent to the pedunculated fibroid. It measures 20.5 x 14.8 x 16.9 mm. There is  free fluid seen measuring 34.5 x 21.7 x 16.2 mm Impression: 1. There is an ectopic pregnancy seen in the right adnexa. 2. No intrauterine pregnancy is seen. 3. There are two uterine fibroids 4. There is a right adnexa simple cyst Gweneth Dimitri, RT The ultrasound images and findings were reviewed by me and I agree with the above report. Prentice Docker, MD, [facility], [facility] 08/19/2018 11:22 AM       Assessment: 28 y.o. G2P0  1. Pregnancy of unknown anatomic location      Plan: Problem List Items Addressed This Visit      Other   Pregnancy of unknown anatomic location - Primary   Relevant Orders   ABO/Rh   Beta hCG quant (ref lab)   Beta hCG quant (ref lab)   CBC   Comprehensive metabolic panel     We discussed that I am fairly certain that she has an ectopic pregnancy, based on the ultrasound.  She has some doubt in her mind and, therefore, does not want to pursue treatment today.  She would prefer more information to feel sure.  We discussed that, at a minimum, a normal pregnancy is highly unlikely given the amount of time that has passed with no intrauterine findings.  A plan was made to recheck her hCG level today and again on Thursday to assess the trend.  I went ahead and obtained labs for methotrexate administration.  We discussed precautions for ectopic pregnancy, such as; new onset abdominal pain with or without vaginal bleeding, dizziness, nausea and throwing up, or any other concerning symptoms.  We discussed that if she develops any of the symptoms, she should go directly and quickly to the emergency room for evaluation.  She voiced understanding of the above and agreement.  20 minutes spent in face to face discussion with > 50% spent in counseling,management, and coordination of care of her pregnancy of unknown anatomic location.  Return in about 3 days (around  08/22/2018) for Follow up for treatment planning.   Prentice Docker, MD, [facility], [facility] 08/19/2018 12:33 PM

## 2018-08-20 ENCOUNTER — Telehealth: Payer: Self-pay | Admitting: Obstetrics and Gynecology

## 2018-08-20 LAB — COMPREHENSIVE METABOLIC PANEL
ALT: 13 IU/L (ref 0–32)
AST: 12 IU/L (ref 0–40)
Albumin/Globulin Ratio: 2.1 (ref 1.2–2.2)
Albumin: 4.5 g/dL (ref 3.9–5.0)
Alkaline Phosphatase: 30 IU/L — ABNORMAL LOW (ref 39–117)
BUN/Creatinine Ratio: 10 (ref 9–23)
BUN: 9 mg/dL (ref 6–20)
Bilirubin Total: 0.3 mg/dL (ref 0.0–1.2)
CO2: 19 mmol/L — ABNORMAL LOW (ref 20–29)
Calcium: 9.4 mg/dL (ref 8.7–10.2)
Chloride: 106 mmol/L (ref 96–106)
Creatinine, Ser: 0.94 mg/dL (ref 0.57–1.00)
GFR calc Af Amer: 95 mL/min/{1.73_m2} (ref >59)
GFR calc non Af Amer: 82 mL/min/{1.73_m2} (ref >59)
Globulin, Total: 2.1 g/dL (ref 1.5–4.5)
Glucose: 92 mg/dL (ref 65–99)
Potassium: 4.6 mmol/L (ref 3.5–5.2)
Sodium: 139 mmol/L (ref 134–144)
Total Protein: 6.6 g/dL (ref 6.0–8.5)

## 2018-08-20 LAB — BETA HCG QUANT (REF LAB): hCG Quant: 4241 m[IU]/mL

## 2018-08-20 LAB — ABO/RH: Rh Factor: POSITIVE

## 2018-08-20 LAB — CBC
Hematocrit: 36.7 % (ref 34.0–46.6)
Hemoglobin: 12.3 g/dL (ref 11.1–15.9)
MCH: 28.4 pg (ref 26.6–33.0)
MCHC: 33.5 g/dL (ref 31.5–35.7)
MCV: 85 fL (ref 79–97)
Platelets: 245 10*3/uL (ref 150–450)
RBC: 4.33 x10E6/uL (ref 3.77–5.28)
RDW: 12.5 % (ref 11.7–15.4)
WBC: 5.9 10*3/uL (ref 3.4–10.8)

## 2018-08-20 NOTE — Telephone Encounter (Signed)
Patient is calling for results. Please advise

## 2018-08-20 NOTE — Telephone Encounter (Signed)
So far the results have not come back today. I'll let her know when they do.Marland KitchenMarland Kitchen

## 2018-08-21 ENCOUNTER — Other Ambulatory Visit: Payer: Self-pay | Admitting: Obstetrics and Gynecology

## 2018-08-21 ENCOUNTER — Telehealth: Payer: Self-pay | Admitting: Obstetrics and Gynecology

## 2018-08-21 DIAGNOSIS — O3680X0 Pregnancy with inconclusive fetal viability, not applicable or unspecified: Secondary | ICD-10-CM

## 2018-08-21 NOTE — Addendum Note (Signed)
Addended by: Prentice Docker D on: 08/21/2018 02:26 PM   Modules accepted: Orders

## 2018-08-21 NOTE — Telephone Encounter (Signed)
Patient aware of date, location and time

## 2018-08-21 NOTE — Telephone Encounter (Signed)
-----   Message from Will Bonnet, MD sent at 08/21/2018  2:28 PM EDT ----- Regarding: patient lab appointments This patient needs an appointment for Monday AND Thursday next week for lab draw only. Would you mind please calling her to arrange? I have placed the orders that she needs for each day. These are very high priority labs to be drawn.  Thanks! Prentice Docker, MD

## 2018-08-21 NOTE — Telephone Encounter (Signed)
Discussed only small rise in hcG indicating either failed pregnancy, but more likely ectopic pregnancy based on ultrasound findings. She is willing to have the methotrexate injection. Will have her report to [facility] Day Surgery tomorrow at 130 (her choice of time).  Discussed follow up labs on Monday and next Thursday.   Discussed risk of not following up.  She seems to be reliable and therefore, a good candidate for MTX.  She voiced understanding and agreement with plan.  Height is 5\' 8"  (1.727 m) Weight 211 lb (95.7 kg)  BSA: 2.09  Dose = 50 mg x  BSA = 50 mg x 2.09 = 104.5 mg MTX IM.

## 2018-08-22 ENCOUNTER — Ambulatory Visit: Payer: Medicaid Other | Admitting: Obstetrics and Gynecology

## 2018-08-22 ENCOUNTER — Ambulatory Visit
Admission: RE | Admit: 2018-08-22 | Discharge: 2018-08-22 | Disposition: A | Discharge status: Home / Self Care | Payer: Medicaid Other | Source: Ambulatory Visit | Attending: Obstetrics and Gynecology | Admitting: Obstetrics and Gynecology

## 2018-08-22 ENCOUNTER — Other Ambulatory Visit: Payer: Self-pay

## 2018-08-22 DIAGNOSIS — Z3A00 Weeks of gestation of pregnancy not specified: Secondary | ICD-10-CM | POA: Diagnosis not present

## 2018-08-22 DIAGNOSIS — O3680X0 Pregnancy with inconclusive fetal viability, not applicable or unspecified: Secondary | ICD-10-CM | POA: Insufficient documentation

## 2018-08-22 LAB — COMPREHENSIVE METABOLIC PANEL
ALT: 15 U/L (ref 0–44)
AST: 17 U/L (ref 15–41)
Albumin: 4.1 g/dL (ref 3.5–5.0)
Alkaline Phosphatase: 27 U/L — ABNORMAL LOW (ref 38–126)
Anion gap: 10 (ref 5–15)
BUN: 9 mg/dL (ref 6–20)
CO2: 21 mmol/L — ABNORMAL LOW (ref 22–32)
Calcium: 8.9 mg/dL (ref 8.9–10.3)
Chloride: 105 mmol/L (ref 98–111)
Creatinine, Ser: 0.87 mg/dL (ref 0.44–1.00)
GFR calc Af Amer: >60 mL/min (ref >60)
GFR calc non Af Amer: >60 mL/min (ref >60)
Glucose, Bld: 87 mg/dL (ref 70–99)
Potassium: 3.5 mmol/L (ref 3.5–5.1)
Sodium: 136 mmol/L (ref 135–145)
Total Bilirubin: 0.9 mg/dL (ref 0.3–1.2)
Total Protein: 6.9 g/dL (ref 6.5–8.1)

## 2018-08-22 LAB — CBC
HCT: 35.5 % — ABNORMAL LOW (ref 36.0–46.0)
Hemoglobin: 11.8 g/dL — ABNORMAL LOW (ref 12.0–15.0)
MCH: 28.3 pg (ref 26.0–34.0)
MCHC: 33.2 g/dL (ref 30.0–36.0)
MCV: 85.1 fL (ref 80.0–100.0)
Platelets: 235 10*3/uL (ref 150–400)
RBC: 4.17 MIL/uL (ref 3.87–5.11)
RDW: 12.4 % (ref 11.5–15.5)
WBC: 4.5 10*3/uL (ref 4.0–10.5)
nRBC: 0 % (ref 0.0–0.2)

## 2018-08-22 LAB — HCG, QUANTITATIVE, PREGNANCY: hCG, Beta Chain, Quant, S: 9542 m[IU]/mL — ABNORMAL HIGH (ref <5)

## 2018-08-22 MED ORDER — METHOTREXATE FOR ECTOPIC PREGNANCY
50.0000 mg/m2 | Freq: Once | INTRAMUSCULAR | Status: AC
  Administered 2018-08-22: 110 mg via INTRAMUSCULAR
  Filled 2018-08-22: qty 1

## 2018-08-22 NOTE — Progress Notes (Signed)
Per Dr Glennon Mac give methotrexate injection

## 2018-08-23 ENCOUNTER — Other Ambulatory Visit: Payer: Self-pay | Admitting: Obstetrics and Gynecology

## 2018-08-23 DIAGNOSIS — O3680X0 Pregnancy with inconclusive fetal viability, not applicable or unspecified: Secondary | ICD-10-CM

## 2018-08-23 LAB — BETA HCG QUANT (REF LAB): hCG Quant: 6487 m[IU]/mL

## 2018-08-23 NOTE — Progress Notes (Signed)
Quant hcg is 6,487 (three days prior it was 4,241). This would correlate to less than a 50% rise, which remains too low a rise in hCG for a normal pregnancy.  This still is concerning for an ectopic pregnancy, especially with the findings on ultrasound.    There is a Beta chain S that is 9,542 and it was 685 two weeks ago. I am using the regular quantitative hCG of 6,487 as the value of measurement of response to the injection of methotrexate.   CBC and CMP normal with normal kidney and liver function indicated by these tests. Methotrexate 110 mg administered.  Plan: Recheck hCG on day 4: 7/20 Recheck hCG, CBC, CMP on day 7/23 to assess response. Looking for about a 15% drop in hCG between day 4 (7/20) and day 7 (7/23).    Prentice Docker, MD, [facility], [facility] 08/23/2018 10:21 AM

## 2018-08-25 ENCOUNTER — Other Ambulatory Visit: Payer: Medicaid Other

## 2018-08-25 ENCOUNTER — Other Ambulatory Visit: Payer: Self-pay

## 2018-08-25 DIAGNOSIS — O3680X0 Pregnancy with inconclusive fetal viability, not applicable or unspecified: Secondary | ICD-10-CM

## 2018-08-26 LAB — COMPREHENSIVE METABOLIC PANEL
ALT: 14 IU/L (ref 0–32)
AST: 14 IU/L (ref 0–40)
Albumin/Globulin Ratio: 2 (ref 1.2–2.2)
Albumin: 4.3 g/dL (ref 3.9–5.0)
Alkaline Phosphatase: 26 IU/L — ABNORMAL LOW (ref 39–117)
BUN/Creatinine Ratio: 8 — ABNORMAL LOW (ref 9–23)
BUN: 7 mg/dL (ref 6–20)
Bilirubin Total: 0.4 mg/dL (ref 0.0–1.2)
CO2: 18 mmol/L — ABNORMAL LOW (ref 20–29)
Calcium: 9.4 mg/dL (ref 8.7–10.2)
Chloride: 102 mmol/L (ref 96–106)
Creatinine, Ser: 0.85 mg/dL (ref 0.57–1.00)
GFR calc Af Amer: 107 mL/min/{1.73_m2} (ref >59)
GFR calc non Af Amer: 93 mL/min/{1.73_m2} (ref >59)
Globulin, Total: 2.1 g/dL (ref 1.5–4.5)
Glucose: 86 mg/dL (ref 65–99)
Potassium: 4.2 mmol/L (ref 3.5–5.2)
Sodium: 136 mmol/L (ref 134–144)
Total Protein: 6.4 g/dL (ref 6.0–8.5)

## 2018-08-26 LAB — CBC
Hematocrit: 35.5 % (ref 34.0–46.6)
Hemoglobin: 12 g/dL (ref 11.1–15.9)
MCH: 27.6 pg (ref 26.6–33.0)
MCHC: 33.8 g/dL (ref 31.5–35.7)
MCV: 82 fL (ref 79–97)
Platelets: 231 10*3/uL (ref 150–450)
RBC: 4.34 x10E6/uL (ref 3.77–5.28)
RDW: 12.4 % (ref 11.7–15.4)
WBC: 3.7 10*3/uL (ref 3.4–10.8)

## 2018-08-26 LAB — BETA HCG QUANT (REF LAB): hCG Quant: 7142 m[IU]/mL

## 2018-08-26 NOTE — Telephone Encounter (Signed)
Pt came in yesterday for repeat blood work . SDJ will call with results

## 2018-08-28 ENCOUNTER — Other Ambulatory Visit: Payer: Medicaid Other

## 2018-08-28 ENCOUNTER — Other Ambulatory Visit: Payer: Self-pay

## 2018-08-28 DIAGNOSIS — O3680X0 Pregnancy with inconclusive fetal viability, not applicable or unspecified: Secondary | ICD-10-CM

## 2018-08-29 ENCOUNTER — Telehealth: Payer: Self-pay | Admitting: Obstetrics and Gynecology

## 2018-08-29 ENCOUNTER — Other Ambulatory Visit: Payer: Self-pay | Admitting: Obstetrics and Gynecology

## 2018-08-29 DIAGNOSIS — O3680X0 Pregnancy with inconclusive fetal viability, not applicable or unspecified: Secondary | ICD-10-CM

## 2018-08-29 LAB — COMPREHENSIVE METABOLIC PANEL
ALT: 14 IU/L (ref 0–32)
AST: 15 IU/L (ref 0–40)
Albumin/Globulin Ratio: 2 (ref 1.2–2.2)
Albumin: 4.2 g/dL (ref 3.9–5.0)
Alkaline Phosphatase: 32 IU/L — ABNORMAL LOW (ref 39–117)
BUN/Creatinine Ratio: 10 (ref 9–23)
BUN: 9 mg/dL (ref 6–20)
Bilirubin Total: <0.2 mg/dL (ref 0.0–1.2)
CO2: 20 mmol/L (ref 20–29)
Calcium: 9.5 mg/dL (ref 8.7–10.2)
Chloride: 103 mmol/L (ref 96–106)
Creatinine, Ser: 0.88 mg/dL (ref 0.57–1.00)
GFR calc Af Amer: 103 mL/min/{1.73_m2} (ref >59)
GFR calc non Af Amer: 89 mL/min/{1.73_m2} (ref >59)
Globulin, Total: 2.1 g/dL (ref 1.5–4.5)
Glucose: 90 mg/dL (ref 65–99)
Potassium: 4 mmol/L (ref 3.5–5.2)
Sodium: 139 mmol/L (ref 134–144)
Total Protein: 6.3 g/dL (ref 6.0–8.5)

## 2018-08-29 LAB — CBC
Hematocrit: 35.7 % (ref 34.0–46.6)
Hemoglobin: 11.8 g/dL (ref 11.1–15.9)
MCH: 28 pg (ref 26.6–33.0)
MCHC: 33.1 g/dL (ref 31.5–35.7)
MCV: 85 fL (ref 79–97)
Platelets: 224 10*3/uL (ref 150–450)
RBC: 4.21 x10E6/uL (ref 3.77–5.28)
RDW: 12.6 % (ref 11.7–15.4)
WBC: 5.2 10*3/uL (ref 3.4–10.8)

## 2018-08-29 LAB — BETA HCG QUANT (REF LAB): hCG Quant: 5671 m[IU]/mL

## 2018-08-29 NOTE — Telephone Encounter (Signed)
-----   Message from Will Bonnet, MD sent at 08/29/2018  1:19 PM EDT ----- Regarding: Schedule lab follow up Please call patient and schedule lab follow-up for next Thursday, July 30.  She does not need an appointment with a provider that day.  The lab order has been placed.  Thank you

## 2018-08-29 NOTE — Telephone Encounter (Signed)
Notified patient of appropriate drop in hCG levels from day 4 today 7. Recommend she come in on day 14 for repeat an hCG and weekly until her level is 0. She has no concerning symptoms today and voiced understanding and agreement with the plan.

## 2018-08-29 NOTE — Telephone Encounter (Signed)
Patient is schedule 09/04/18 at 9 am

## 2018-08-29 NOTE — Progress Notes (Signed)
This patient has an ectopic. She has been treated with MTX and had an appropriate drop in her hCG from day 4 to day 7. SHe is coming in on Thursday, 7/30 to have her hCG drawn again to ensure continued dropping of her level.  Would one of you mind following up on this? The result will come to me . So, you would have to go to her chart to get the result. Please call her and let her know the results. She, unfortunately, is not on [employer].

## 2018-09-01 ENCOUNTER — Telehealth: Payer: Self-pay

## 2018-09-01 NOTE — Telephone Encounter (Signed)
Pt calling triage today. Had miscarriage this past Saturday and pt is very anemic. She is taking 65 mg of Iron. She is wanting to know if she needs to take more daily. She is very light headed and dizzy. Can you see if AMS can advise since SDJ out of office? I will call pt back. Thank you.

## 2018-09-01 NOTE — Telephone Encounter (Signed)
Her most recent labs don't show her to be significantly anemic so that should suffice

## 2018-09-01 NOTE — Telephone Encounter (Signed)
Please call patient and relay AMS message. Thanks

## 2018-09-01 NOTE — Telephone Encounter (Signed)
Please advise in SDJ absence

## 2018-09-01 NOTE — Telephone Encounter (Signed)
Pt aware via vm 

## 2018-09-04 ENCOUNTER — Other Ambulatory Visit: Payer: Self-pay

## 2018-09-04 ENCOUNTER — Other Ambulatory Visit: Payer: Medicaid Other

## 2018-09-04 DIAGNOSIS — O3680X0 Pregnancy with inconclusive fetal viability, not applicable or unspecified: Secondary | ICD-10-CM

## 2018-09-05 ENCOUNTER — Other Ambulatory Visit: Payer: Self-pay | Admitting: Obstetrics & Gynecology

## 2018-09-05 DIAGNOSIS — O3680X0 Pregnancy with inconclusive fetal viability, not applicable or unspecified: Secondary | ICD-10-CM

## 2018-09-05 LAB — BETA HCG QUANT (REF LAB): hCG Quant: 3253 m[IU]/mL

## 2018-09-05 NOTE — Progress Notes (Signed)
Discussed labs w patient She has some bleeding still, getting lighter Min pain  Beta has come down to 3200 from 5600 prior week.  Reassured, cont to monitor sx's and levels. Order for beta hCG placed for Aug 10.  Barnett Applebaum, MD, [facility], [facility] 09/05/2018  8:36 AM

## 2018-09-05 NOTE — Progress Notes (Signed)
Discussed labs w patient She has some bleeding still, getting lighter Min pain  Beta has come down to 3200 from 5600 prior week.  Reassured, cont to monitor sx's and levels. Order for beta hCG placed for Aug 10.  Barnett Applebaum, MD, [facility], [facility] 09/05/2018  8:36 AM

## 2018-09-09 ENCOUNTER — Encounter: Payer: Self-pay | Admitting: Maternal Newborn

## 2018-09-09 ENCOUNTER — Other Ambulatory Visit: Payer: Self-pay

## 2018-09-09 ENCOUNTER — Ambulatory Visit (INDEPENDENT_AMBULATORY_CARE_PROVIDER_SITE_OTHER): Payer: Medicaid Other | Admitting: Maternal Newborn

## 2018-09-09 VITALS — BP 100/60 | Ht 68.5 in | Wt 213.2 lb

## 2018-09-09 DIAGNOSIS — B9689 Other specified bacterial agents as the cause of diseases classified elsewhere: Secondary | ICD-10-CM

## 2018-09-09 DIAGNOSIS — N760 Acute vaginitis: Secondary | ICD-10-CM

## 2018-09-09 DIAGNOSIS — N898 Other specified noninflammatory disorders of vagina: Secondary | ICD-10-CM

## 2018-09-09 LAB — POCT WET PREP (WET MOUNT)
Clue Cells Wet Prep Whiff POC: POSITIVE
Trichomonas Wet Prep HPF POC: ABSENT

## 2018-09-09 MED ORDER — METRONIDAZOLE 500 MG PO TABS: 500.0000 mg | ORAL_TABLET | Freq: Two times a day (BID) | ORAL | 0 refills | Status: AC

## 2018-09-09 NOTE — Progress Notes (Signed)
Obstetrics & Gynecology Office Visit   Chief Complaint:  Chief Complaint  Patient presents with  . Vaginal Discharge    with odor, no itchiness/irritation x 2 days    History of Present Illness: Alicia Terry was treated with methotrexate for pregnancy of unknown anatomic location (likely ectopic) on 08/22/2018. She recently stopped bleeding about four days ago, and noticed about two days ago that her vaginal discharge has a distinct odor. She has not noticed an increase in the amount of vaginal discharge or a change in the color or consistency. She has not had intercourse since before the procedure. She has not changed any of her personal care or hygiene products.  Review of Systems: Review of systems negative unless otherwise noted in HPI.  Past Medical History:  Past Medical History:  Diagnosis Date  . No known health problems     Past Surgical History:  Past Surgical History:  Procedure Laterality Date  . NO PAST SURGERIES      Gynecologic History: Patient's last menstrual period was 08/23/2018 (approximate).  Obstetric History: G1P0  Family History:  Family History  Problem Relation Age of Onset  . Diabetes Mother   . Hypertension Mother     Social History:  Social History   Socioeconomic History  . Marital status: Single    Spouse name: Not on file  . Number of children: Not on file  . Years of education: Not on file  . Highest education level: Not on file  Occupational History  . Not on file  Social Needs  . Financial resource strain: Not on file  . Food insecurity    Worry: Not on file    Inability: Not on file  . Transportation needs    Medical: Not on file    Non-medical: Not on file  Tobacco Use  . Smoking status: Never Smoker  . Smokeless tobacco: Never Used  Substance and Sexual Activity  . Alcohol use: Yes  . Drug use: No  . Sexual activity: Not Currently    Birth control/protection: None  Lifestyle  . Physical activity    Days per week:  Not on file    Minutes per session: Not on file  . Stress: Not on file  Relationships  . Social Herbalist on phone: Not on file    Gets together: Not on file    Attends religious service: Not on file    Active member of club or organization: Not on file    Attends meetings of clubs or organizations: Not on file    Relationship status: Not on file  . Intimate partner violence    Fear of current or ex partner: Not on file    Emotionally abused: Not on file    Physically abused: Not on file    Forced sexual activity: Not on file  Other Topics Concern  . Not on file  Social History Narrative  . Not on file    Allergies:  No Known Allergies  Medications: Prior to Admission medications   Medication Sig Start Date End Date Taking? Authorizing Provider  ferrous sulfate 325 (65 FE) MG tablet Take 325 mg by mouth daily as needed (for low iron (sluggishness)).   Yes [provider]  Multiple Vitamins-Calcium (ONE-A-DAY WOMENS PO) Take 1 tablet by mouth daily.   Yes [provider]    Physical Exam Vitals:  Vitals:   09/09/18 1055  BP: 100/60   Patient's last menstrual period was 08/23/2018 (  approximate).  General: NAD Pulmonary: No increased work of breathing  Genitourinary:  External: Normal external female genitalia.  Normal urethral  meatus, normal Bartholin's and Skene's glands.    Vagina: Normal vaginal mucosa, no evidence of prolapse.  Small amount of thin, white discharge present.  Neurologic: Grossly intact Psychiatric: mood appropriate, affect full  Wet Prep: PH: 6.5 Clue Cells: Positive; positive whiff Fungal elements: Negative Trichomonas: Negative  Assessment: 28 y.o. G1P0 with bacterial vaginosis.  Plan: Problem List Items Addressed This Visit    None    Visit Diagnoses    Vaginal odor    -  Primary   Relevant Orders   POCT Wet Prep ([place])   Bacterial vaginosis       Relevant Medications   metroNIDAZOLE (FLAGYL) 500  MG tablet     Treat bacterial vaginosis with Flagyl; Rx sent to patient's pharmacy.  She will follow up for labs trending beta HCG as previously discussed with MD.  Avel Sensor, CNM 09/09/2018  1:17 PM

## 2018-09-15 ENCOUNTER — Other Ambulatory Visit: Payer: Self-pay

## 2018-09-15 ENCOUNTER — Other Ambulatory Visit: Payer: Medicaid Other

## 2018-09-15 DIAGNOSIS — O3680X0 Pregnancy with inconclusive fetal viability, not applicable or unspecified: Secondary | ICD-10-CM

## 2018-09-16 ENCOUNTER — Telehealth: Payer: Self-pay | Admitting: Obstetrics and Gynecology

## 2018-09-16 LAB — BETA HCG QUANT (REF LAB): hCG Quant: 1780 m[IU]/mL

## 2018-09-16 NOTE — Telephone Encounter (Signed)
went straight to voicemail. Unable to leave message as voicemail not set up. Home number does not appear to be working at all. Will let her know that she had an appropriate drop in hCG.  Continue to monitor weekly until zero.

## 2018-09-19 ENCOUNTER — Telehealth: Payer: Self-pay

## 2018-09-19 NOTE — Telephone Encounter (Signed)
Discussed hCG result with patient. Encouraged by drop in hCG. However, I reiterated that we will need to continue to watch the hCG level until it goes to zero.  She is having some pain in her upper thighs/lower abdomen. No vaginal bleeding. No other symptoms. She has taken some ibuprofen to see if she can get relief.   She has a follow-up in-clinic appt on 8/19 at her request. Will check hCG at that time.

## 2018-09-19 NOTE — Telephone Encounter (Signed)
Spoke w/pt. She states the pain isn't in her stomach/abdomen. It's more like the side of her leg near her vagina. She denies any vaginal pain/bleeding. Inquired if patient had tried taking anything to relieve the pain. Patient states she hasn't had any f/u apt & she feels like she is "entitled" to one after what she was treated for. Advised Dr. Glennon Mac attempted to reach her on 09/16/2018 to notify her of her 09/15/2018 lab results. Patient interrupted stating that she didn't have any missed calls & she doesn't work & that no one had tried to reach her. She repeating she wanted to schedule a f/u apt that she is entitled to. Advised I would transfer her to front desk to schedule. Placed on hold. Tried to reach front desk. Call was dropped or patient hung up.

## 2018-09-19 NOTE — Telephone Encounter (Signed)
Patient reports she is having stomach & pelvic pain (cramping). Inquiring if this is normal s/p tubal pregnancy resolved with injection ~5-6 wks ago. States she has not had intercourse.

## 2018-09-20 ENCOUNTER — Other Ambulatory Visit: Payer: Self-pay

## 2018-09-20 ENCOUNTER — Emergency Department
Admission: EM | Admit: 2018-09-20 | Discharge: 2018-09-20 | Disposition: A | Discharge status: Home / Self Care | Payer: Medicaid Other | Attending: Emergency Medicine | Admitting: Emergency Medicine

## 2018-09-20 DIAGNOSIS — R590 Localized enlarged lymph nodes: Secondary | ICD-10-CM | POA: Insufficient documentation

## 2018-09-20 DIAGNOSIS — Z79899 Other long term (current) drug therapy: Secondary | ICD-10-CM | POA: Diagnosis not present

## 2018-09-20 DIAGNOSIS — R1031 Right lower quadrant pain: Secondary | ICD-10-CM | POA: Diagnosis present

## 2018-09-20 LAB — URINALYSIS, COMPLETE (UACMP) WITH MICROSCOPIC
Bilirubin Urine: NEGATIVE
Glucose, UA: NEGATIVE mg/dL
Hgb urine dipstick: NEGATIVE
Ketones, ur: NEGATIVE mg/dL
Leukocytes,Ua: NEGATIVE
Nitrite: NEGATIVE
Protein, ur: NEGATIVE mg/dL
Specific Gravity, Urine: 1.024 (ref 1.005–1.030)
pH: 6 (ref 5.0–8.0)

## 2018-09-20 LAB — WET PREP, GENITAL
Clue Cells Wet Prep HPF POC: NONE SEEN
Sperm: NONE SEEN
Trich, Wet Prep: NONE SEEN
Yeast Wet Prep HPF POC: NONE SEEN

## 2018-09-20 MED ORDER — IBUPROFEN 600 MG PO TABS
600.0000 mg | ORAL_TABLET | Freq: Once | ORAL | Status: AC
  Administered 2018-09-20: 07:00:00 600 mg via ORAL
  Filled 2018-09-20: qty 1

## 2018-09-20 MED ORDER — TRAMADOL HCL 50 MG PO TABS: 50.0000 mg | ORAL_TABLET | Freq: Four times a day (QID) | ORAL | 0 refills | Status: DC | PRN

## 2018-09-20 NOTE — ED Notes (Signed)
Patient states she is having pain on her right side groining area. Pt states she just had an ectopic pregnancy last month and not sure if that is cause of pain.

## 2018-09-20 NOTE — ED Provider Notes (Signed)
[facility] Emergency Department Provider Note ____________________________________________   First MD Initiated Contact with Patient 09/20/18 0421     (approximate)  I have reviewed the triage vital signs and the nursing notes.   HISTORY  Chief Complaint Groin Pain    HPI Alicia Terry is a 28 y.o. female with no significant past medical history who presents with right groin pain over the last several days, gradual onset.  It is nonradiating.  It is located over the inguinal area.  The patient denies any pain, injuries, rashes, or lesions to the right leg.  She states that she is currently being followed for an ectopic pregnancy and states that her hCGs have been coming down.  She also recently was treated for BV with antibiotics ending 3 days ago.  She denies urinary symptoms or any new vaginal bleeding or discharge.  She has no fever or any other swollen lymph nodes elsewhere.  Past Medical History:  Diagnosis Date  . No known health problems     Patient Active Problem List   Diagnosis Date Noted  . Pregnancy of unknown anatomic location 08/19/2018  . Abdominal pain, lower 08/07/2018    Past Surgical History:  Procedure Laterality Date  . NO PAST SURGERIES      Prior to Admission medications   Medication Sig Start Date End Date Taking? Authorizing Provider  ferrous sulfate 325 (65 FE) MG tablet Take 325 mg by mouth daily as needed (for low iron (sluggishness)).    [provider]  Multiple Vitamins-Calcium (ONE-A-DAY WOMENS PO) Take 1 tablet by mouth daily.    [provider]  traMADol (ULTRAM) 50 MG tablet Take 1 tablet (50 mg total) by mouth every 6 (six) hours as needed. 09/20/18 09/20/19  Arta Silence, MD    Allergies Patient has no known allergies.  Family History  Problem Relation Age of Onset  . Diabetes Mother   . Hypertension Mother     Social History Social History   Tobacco Use  . Smoking  status: Never Smoker  . Smokeless tobacco: Never Used  Substance Use Topics  . Alcohol use: Yes  . Drug use: No    Review of Systems  Constitutional: No fever. Eyes: No redness. ENT: No sore throat. Cardiovascular: Denies chest pain. Respiratory: Denies shortness of breath. Gastrointestinal: No vomiting or diarrhea.  Genitourinary: Negative for dysuria.  Musculoskeletal: Negative for back pain. Skin: Negative for rash. Neurological: Negative for headache.   ____________________________________________   PHYSICAL EXAM:  VITAL SIGNS: ED Triage Vitals  Enc Vitals Group     BP 09/20/18 0347 (!) 143/84     Pulse Rate 09/20/18 0347 70     Resp 09/20/18 0347 16     Temp 09/20/18 0347 98.5 F (36.9 C)     Temp Source 09/20/18 0347 Oral     SpO2 09/20/18 0347 100 %     Weight 09/20/18 0348 203 lb (92.1 kg)     Height 09/20/18 0348 5\' 8"  (1.727 m)     Head Circumference --      Peak Flow --      Pain Score 09/20/18 0348 10     Pain Loc --      Pain Edu? --      Excl. in [place]? --     Constitutional: Alert and oriented. Well appearing and in no acute distress. Eyes: Conjunctivae are normal.  Head: Atraumatic. Nose: No congestion/rhinnorhea. Mouth/Throat: Mucous membranes are moist.   Neck: Normal  range of motion.  Cardiovascular: Good peripheral circulation. Respiratory: Normal respiratory effort.  No retractions.  Gastrointestinal: Soft and nontender. No distention.  Genitourinary: Normal external genitalia.  No CMT or adnexal tenderness.  Small amount of whitish vaginal discharge.  Right inguinal area with mild tenderness.  No large palpable lymph nodes.  No rash or external lesions. Musculoskeletal: No lower extremity edema.  Extremities warm and well perfused.  Neurologic:  Normal speech and language. No gross focal neurologic deficits are appreciated.  Skin:  Skin is warm and dry. No rash noted. Psychiatric: Mood and affect are normal. Speech and behavior are normal.   ____________________________________________   LABS (all labs ordered are listed, but only abnormal results are displayed)  Labs Reviewed  WET PREP, GENITAL - Abnormal; Notable for the following components:      Result Value   WBC, Wet Prep HPF POC RARE (*)    All other components within normal limits  URINALYSIS, COMPLETE (UACMP) WITH MICROSCOPIC - Abnormal; Notable for the following components:   Color, Urine YELLOW (*)    APPearance HAZY (*)    Bacteria, UA RARE (*)    All other components within normal limits   ____________________________________________  EKG   ____________________________________________  RADIOLOGY    ____________________________________________   PROCEDURES  Procedure(s) performed: No  Procedures  Critical Care performed: No ____________________________________________   INITIAL IMPRESSION / ASSESSMENT AND PLAN / ED COURSE  Pertinent labs & imaging results that were available during my care of the patient were reviewed by me and considered in my medical decision making (see chart for details).  28 year old female with PMH as noted above and who is being followed for an ectopic pregnancy last month presents with right inguinal area pain over the last few days.  She states she was recently treated for BV but has no new vaginal discharge or bleeding.  She has no urinary symptoms.  She has no injuries, rashes, or lesions to the leg, and no fever or systemic symptoms.  On exam, the patient is well-appearing.  Her vital signs are normal.  The abdomen is soft and nontender.  Pelvic exam is unremarkable.  She has mild tenderness in the right inguinal area consistent with mild lymphadenopathy although there are no large palpable nodes.  We obtained a wet prep to evaluate for continued BV or other infection.  The patient has no findings compatible with STI and states that she has not been sexually active in the last 2 months.  Based on the reassuring  exam, normal vital signs, and lack of other symptoms, there is no indication for imaging or further lab work-up.  The wet prep was negative.  At this time, the patient is stable for discharge home.  I counseled her on possible causes of inguinal lymphadenopathy.  I recommended that she take NSAIDs for the inflammation and I will also prescribe tramadol for any breakthrough pain.  I instructed her to follow-up with her OB/GYN.  Return precautions given, and the patient expressed understanding.  ____________________________________________   FINAL CLINICAL IMPRESSION(S) / ED DIAGNOSES  Final diagnoses:  Inguinal lymphadenopathy      NEW MEDICATIONS STARTED DURING THIS VISIT:  Discharge Medication List as of 09/20/2018  6:51 AM    START taking these medications   Details  traMADol (ULTRAM) 50 MG tablet Take 1 tablet (50 mg total) by mouth every 6 (six) hours as needed., Starting Sat 09/20/2018, Until Sun 09/20/2019, Normal         Note:  This document was prepared using Dragon voice recognition software and may include unintentional dictation errors.   Arta Silence, MD 09/20/18 [phone]

## 2018-09-20 NOTE — Discharge Instructions (Signed)
The pain in the groin is likely due to swollen lymph nodes in this area.  These are often in response to an infection or other inflammation.  You can continue ibuprofen for pain and take the tramadol prescribed today if needed for more severe pain.  Return to the ER for new or worsening pain, swelling to the area, redness or rash, fever, or any other new or worsening symptoms that concern you.  Otherwise you can follow-up with your OB/GYN and/or primary care physician in the next 1 to 2 weeks.

## 2018-09-20 NOTE — ED Triage Notes (Signed)
Pt states was diagnosed with ectopic pregnancy on July 17th. Pt states her HCG level was checked last week and level is down. Pt states she now has right sided groin pain. Pt states pain improves when pressure is applied. Pt denies fever, vaginal discharge, back pain, known hematuria.

## 2018-09-24 ENCOUNTER — Ambulatory Visit: Payer: Medicaid Other | Admitting: Obstetrics and Gynecology

## 2018-10-01 ENCOUNTER — Other Ambulatory Visit: Payer: Self-pay

## 2018-10-01 ENCOUNTER — Encounter: Payer: Self-pay | Admitting: Physician Assistant

## 2018-10-01 ENCOUNTER — Ambulatory Visit: Payer: Medicaid Other | Admitting: Physician Assistant

## 2018-10-01 DIAGNOSIS — B9689 Other specified bacterial agents as the cause of diseases classified elsewhere: Secondary | ICD-10-CM | POA: Diagnosis not present

## 2018-10-01 DIAGNOSIS — Z113 Encounter for screening for infections with a predominantly sexual mode of transmission: Secondary | ICD-10-CM | POA: Diagnosis not present

## 2018-10-01 DIAGNOSIS — N760 Acute vaginitis: Secondary | ICD-10-CM

## 2018-10-01 LAB — WET PREP FOR TRICH, YEAST, CLUE
Trichomonas Exam: NEGATIVE
Yeast Exam: NEGATIVE

## 2018-10-01 MED ORDER — METRONIDAZOLE 500 MG PO TABS: 500.0000 mg | ORAL_TABLET | Freq: Two times a day (BID) | ORAL | 0 refills | Status: DC

## 2018-10-01 NOTE — Progress Notes (Signed)
S:  Patient into clinic requesting a wet mount to check for BV only.  Declines other screening tests and blood work.  Reports that she has not had sex since 07/13/2018, before her last visit.  Denies any changes in her history since her last visit except that she has had a "shot" for an ectopic pregnancy on 08/23/2018.  States that she thinks that she shot caused her to start having white discharge and vaginal odor over the last week.  NKDA. O:  WDWN female in NAD, A&O x3; skin=warm, dry, without edema, erythema, lesions, rashes and inguinal adenopathy; abd=soft, nt, no masses or guarding; external genitalia/pubic area=normal female without edema, erythema, nits, lice, lesions, vagina with normal mucosa, moderate amount of white, adherent discharge, pH= >4.5; wet mount= positive clue and amine, negative yeast and trich. A/P:  1.  Patient declines screening for HIV, Syphilis, Hepatitis, GC and Chlamydia today. 2.  Will treat for BV with Metronidazole 500mg  #14 1 po BID for 7days with food, no EtOH for 24 hr before and until 72 hr after completing medicine. 3.  No sex for 7 days 4.  Reviewed with patient how to take and possible SE of medication and when to d/c and call clinic 5.  Rec condoms with all sex 6.  Counseled to use OTC antifungal cream if needed for itching during or after treatment with antibiotic.

## 2018-10-02 ENCOUNTER — Ambulatory Visit: Payer: Medicaid Other | Admitting: Obstetrics and Gynecology

## 2018-10-26 NOTE — Progress Notes (Signed)
Patient, No Pcp Per   Chief Complaint  Patient presents with  . Vaginal Discharge    sour odor, no itchiness/irritation, pt says she gets it every month    HPI:      [name] is a 28 y.o. G1P0 who LMP was Patient's last menstrual period was 10/14/2018 (exact date)., presents today for increased d/c with odor, mild irritation for 5 days. Has had mild LBP, no pelvic pain, fevers or urin sx. Pt uses dove sens skin soap, no dryer sheets. Not taking probiotics. She is not sex active. Treated for BV 8/20 with flagyl with temporary relief; hx of recurrent BV, occurring monthly per pt report. Sx started after ectopic pregnancy.  Hx of chlamydia in the past.  Past Medical History:  Diagnosis Date  . BV (bacterial vaginosis)   . Chlamydia   . Ectopic pregnancy     Past Surgical History:  Procedure Laterality Date  . NO PAST SURGERIES      Family History  Problem Relation Age of Onset  . Diabetes Mother   . Hypertension Mother     Social History   Socioeconomic History  . Marital status: Single    Spouse name: Not on file  . Number of children: Not on file  . Years of education: Not on file  . Highest education level: Not on file  Occupational History  . Not on file  Social Needs  . Financial resource strain: Not on file  . Food insecurity    Worry: Not on file    Inability: Not on file  . Transportation needs    Medical: Not on file    Non-medical: Not on file  Tobacco Use  . Smoking status: Never Smoker  . Smokeless tobacco: Never Used  Substance and Sexual Activity  . Alcohol use: Yes  . Drug use: Yes    Types: Marijuana  . Sexual activity: Not Currently    Birth control/protection: None  Lifestyle  . Physical activity    Days per week: Not on file    Minutes per session: Not on file  . Stress: Not on file  Relationships  . Social Herbalist on phone: Not on file    Gets together: Not on file    Attends religious service: Not on  file    Active member of club or organization: Not on file    Attends meetings of clubs or organizations: Not on file    Relationship status: Not on file  . Intimate partner violence    Fear of current or ex partner: Not on file    Emotionally abused: Not on file    Physically abused: Not on file    Forced sexual activity: Not on file  Other Topics Concern  . Not on file  Social History Narrative  . Not on file    Outpatient Medications Prior to Visit  Medication Sig Dispense Refill  . ferrous sulfate 325 (65 FE) MG tablet Take 325 mg by mouth daily as needed (for low iron (sluggishness)).    . Multiple Vitamins-Calcium (ONE-A-DAY WOMENS PO) Take 1 tablet by mouth daily.    . metroNIDAZOLE (FLAGYL) 500 MG tablet Take 1 tablet (500 mg total) by mouth 2 (two) times daily. 14 tablet 0  . traMADol (ULTRAM) 50 MG tablet Take 1 tablet (50 mg total) by mouth every 6 (six) hours as needed. 15 tablet 0   No facility-administered medications prior to visit.  ROS:  Review of Systems  Constitutional: Negative for fever.  Gastrointestinal: Negative for blood in stool, constipation, diarrhea, nausea and vomiting.  Genitourinary: Positive for vaginal discharge. Negative for dyspareunia, dysuria, flank pain, frequency, hematuria, urgency, vaginal bleeding and vaginal pain.  Musculoskeletal: Negative for back pain.  Skin: Negative for rash.  BREAST: No symptoms   OBJECTIVE:   Vitals:  BP 120/80   Ht 5\' 8"  (1.727 m)   Wt 226 lb (102.5 kg)   LMP 10/14/2018 (Exact Date)   BMI 34.36 kg/m   Physical Exam Vitals signs reviewed.  Constitutional:      Appearance: She is well-developed.  Neck:     Musculoskeletal: Normal range of motion.  Pulmonary:     Effort: Pulmonary effort is normal.  Genitourinary:    General: Normal vulva.     Pubic Area: No rash.      Labia:        Right: No rash, tenderness or lesion.        Left: No rash, tenderness or lesion.      Vagina: Vaginal  discharge present. No erythema or tenderness.     Cervix: Normal.     Uterus: Normal. Not enlarged and not tender.      Adnexa: Right adnexa normal and left adnexa normal.       Right: No mass or tenderness.         Left: No mass or tenderness.    Musculoskeletal: Normal range of motion.  Skin:    General: Skin is warm and dry.  Neurological:     General: No focal deficit present.     Mental Status: She is alert and oriented to person, place, and time.  Psychiatric:        Mood and Affect: Mood normal.        Behavior: Behavior normal.        Thought Content: Thought content normal.        Judgment: Judgment normal.     Results: Results for orders placed or performed in visit on 10/27/18 (from the past 24 hour(s))  POCT Wet Prep with KOH     Status: Abnormal   Collection Time: 10/27/18  9:27 AM  Result Value Ref Range   Trichomonas, UA Negative    Clue Cells Wet Prep HPF POC pos    Epithelial Wet Prep HPF POC     Yeast Wet Prep HPF POC neg    Bacteria Wet Prep HPF POC     RBC Wet Prep HPF POC     WBC Wet Prep HPF POC     KOH Prep POC Positive (A) Negative     Assessment/Plan: Bacterial vaginosis - Plan: clindamycin (CLEOCIN) 300 MG capsule, POCT Wet Prep with KOH, Other/Misc lab test; Recurrent sx. Rx clindamycin, diflucan prn yeast vag sx with abx use. Vaginal hygiene discussed. Add probiotics. Check culture. Will f/u with results.  Screening for STD (sexually transmitted disease) - Plan: Other/Misc lab test    Meds ordered this encounter  Medications  . clindamycin (CLEOCIN) 300 MG capsule    Sig: Take 1 capsule (300 mg total) by mouth 2 (two) times daily for 7 days.    Dispense:  14 capsule    Refill:  0    Order Specific Question:   Supervising Provider    Answer:   Gae Dry [id number]  . fluconazole (DIFLUCAN) 150 MG tablet    Sig: Take 1 tablet (150 mg total) by mouth once for 1  dose.    Dispense:  1 tablet    Refill:  0    Order Specific Question:    Supervising Provider    Answer:   Gae Dry [id number]      Return if symptoms worsen or fail to improve.  Alicia B. Copland, PA-C 10/27/2018 9:29 AM

## 2018-10-27 ENCOUNTER — Encounter: Payer: Self-pay | Admitting: Obstetrics and Gynecology

## 2018-10-27 ENCOUNTER — Other Ambulatory Visit: Payer: Self-pay

## 2018-10-27 ENCOUNTER — Ambulatory Visit (INDEPENDENT_AMBULATORY_CARE_PROVIDER_SITE_OTHER): Payer: Medicaid Other | Admitting: Obstetrics and Gynecology

## 2018-10-27 VITALS — BP 120/80 | Ht 68.0 in | Wt 226.0 lb

## 2018-10-27 DIAGNOSIS — B9689 Other specified bacterial agents as the cause of diseases classified elsewhere: Secondary | ICD-10-CM

## 2018-10-27 DIAGNOSIS — Z113 Encounter for screening for infections with a predominantly sexual mode of transmission: Secondary | ICD-10-CM

## 2018-10-27 DIAGNOSIS — N760 Acute vaginitis: Secondary | ICD-10-CM | POA: Diagnosis not present

## 2018-10-27 LAB — POCT WET PREP WITH KOH
Clue Cells Wet Prep HPF POC: POSITIVE
KOH Prep POC: POSITIVE — AB
Trichomonas, UA: NEGATIVE
Yeast Wet Prep HPF POC: NEGATIVE

## 2018-10-27 MED ORDER — FLUCONAZOLE 150 MG PO TABS: 150.0000 mg | ORAL_TABLET | Freq: Once | ORAL | 0 refills | Status: DC

## 2018-10-27 MED ORDER — CLINDAMYCIN HCL 300 MG PO CAPS: 300.0000 mg | ORAL_CAPSULE | Freq: Two times a day (BID) | ORAL | 0 refills | Status: DC

## 2018-10-27 NOTE — Patient Instructions (Signed)
I value your feedback and entrusting us with your care. If you get a [facility] patient survey, I would appreciate you taking the time to let us know about your experience today. Thank you!  HEALTHY VAGINAL HYGIENE  AVOID   Panytyhose Synthetic underwear (wear COTTON underwear)  Tight pants/jeans Thongs Pantyliners Scented soaps/shower gels (use Dove Sensitive Skin soap or water to clean) Bubble bath/bath bombs Scented detergents  ALL dryer sheets (line dry underwear if using them on your other clothing) Feminine sprays/douches   FOR RECURRENT BACTERIAL VAGINOSIS (BV) Above recommendations and ADD probiotics daily, USE CONDOMS  Visit [email or web address]     

## 2018-11-03 ENCOUNTER — Telehealth: Payer: Self-pay | Admitting: Obstetrics and Gynecology

## 2018-11-03 DIAGNOSIS — N898 Other specified noninflammatory disorders of vagina: Secondary | ICD-10-CM

## 2018-11-03 MED ORDER — MOXIFLOXACIN HCL 400 MG PO TABS: 400.0000 mg | ORAL_TABLET | Freq: Every day | ORAL | 0 refills | Status: AC

## 2018-11-03 NOTE — Telephone Encounter (Signed)
Pt aware of One Swab AV and BV results, neg STD testing. Culture showed GBS and entero faecalis, as well as 4 types of BV. Pt's sx improved with clindamycin so far (not relieved with flagyl in past). Will add avelox QD for 6 days for AV. Cont probiotics, add boric acid supp QHS for 2 months as preventive, due to recurrent nature. F/u prn.

## 2018-11-06 ENCOUNTER — Other Ambulatory Visit: Payer: Self-pay | Admitting: Obstetrics and Gynecology

## 2018-11-06 ENCOUNTER — Telehealth: Payer: Self-pay

## 2018-11-06 MED ORDER — FLUCONAZOLE 150 MG PO TABS: 150.0000 mg | ORAL_TABLET | Freq: Once | ORAL | 0 refills | Status: DC

## 2018-11-06 NOTE — Telephone Encounter (Signed)
Rx RF eRxd.  

## 2018-11-06 NOTE — Telephone Encounter (Signed)
Pt calling; was rx'd antibx; now has yeast inf; rx'd yeast pill; is it okay to take yeast pill while still taking the antibx?  [phone]

## 2018-11-06 NOTE — Telephone Encounter (Signed)
Pt aware.

## 2018-11-06 NOTE — Telephone Encounter (Signed)
Please advise 

## 2018-11-06 NOTE — Progress Notes (Signed)
Rx RF diflucan since pt lost Rx.

## 2018-11-06 NOTE — Telephone Encounter (Signed)
Yes

## 2018-11-06 NOTE — Telephone Encounter (Signed)
Pt is asking if you can resend the yeast inf pill because she lost it.

## 2018-12-01 ENCOUNTER — Other Ambulatory Visit: Payer: Self-pay | Admitting: Obstetrics and Gynecology

## 2018-12-01 MED ORDER — FLUCONAZOLE 150 MG PO TABS: 150.0000 mg | ORAL_TABLET | Freq: Once | ORAL | 0 refills | Status: AC

## 2018-12-01 NOTE — Telephone Encounter (Signed)
Try the boric acid supp nightly. Can get at [email or web address] or [facility]. I'll send in diflucan in meantime. Very important to start boric acid as preventive, in conjunction with oral probiotics.

## 2018-12-01 NOTE — Progress Notes (Signed)
Rx RF diflucan for yeast vag sx 

## 2018-12-01 NOTE — Telephone Encounter (Signed)
Patient requesting a refill of Diflucan. [phone]

## 2018-12-01 NOTE — Telephone Encounter (Signed)
A little itching, no irritation. Says she is doing probiotics but not the supp, she never got them (says she didn't know she could do both at the same time).

## 2018-12-01 NOTE — Telephone Encounter (Signed)
Any vaginal itching/irritation? Is she using boric acid supp? Thx

## 2018-12-01 NOTE — Telephone Encounter (Signed)
Spoke w/patient. She took the [place] on 11/06/2018. She is starting to get the thick curdy d/c again. Requesting Diflucan be sent to [facility].

## 2018-12-01 NOTE — Telephone Encounter (Signed)
Pt aware. Mentioned as soon as she got off phone with me after last call, she placed her order and boric acid supp should be here soon for her to start.

## 2018-12-08 ENCOUNTER — Ambulatory Visit: Payer: Medicaid Other | Admitting: Obstetrics and Gynecology

## 2018-12-08 ENCOUNTER — Other Ambulatory Visit: Payer: Self-pay

## 2018-12-08 ENCOUNTER — Other Ambulatory Visit (HOSPITAL_COMMUNITY)
Admission: RE | Admit: 2018-12-08 | Discharge: 2018-12-08 | Disposition: A | Discharge status: Home / Self Care | Payer: Medicaid Other | Source: Ambulatory Visit | Attending: Obstetrics and Gynecology | Admitting: Obstetrics and Gynecology

## 2018-12-08 ENCOUNTER — Encounter: Payer: Self-pay | Admitting: Obstetrics and Gynecology

## 2018-12-08 VITALS — BP 128/70 | Ht 68.0 in | Wt 228.0 lb

## 2018-12-08 DIAGNOSIS — Z113 Encounter for screening for infections with a predominantly sexual mode of transmission: Secondary | ICD-10-CM

## 2018-12-08 DIAGNOSIS — N760 Acute vaginitis: Secondary | ICD-10-CM

## 2018-12-08 DIAGNOSIS — B9689 Other specified bacterial agents as the cause of diseases classified elsewhere: Secondary | ICD-10-CM | POA: Diagnosis not present

## 2018-12-08 LAB — POCT WET PREP WITH KOH
Clue Cells Wet Prep HPF POC: POSITIVE
KOH Prep POC: NEGATIVE
Trichomonas, UA: NEGATIVE
Yeast Wet Prep HPF POC: NEGATIVE

## 2018-12-08 MED ORDER — CLINDAMYCIN HCL 300 MG PO CAPS: 300.0000 mg | ORAL_CAPSULE | Freq: Two times a day (BID) | ORAL | 0 refills | Status: DC

## 2018-12-08 NOTE — Patient Instructions (Signed)
I value your feedback and entrusting us with your care. If you get a [facility] patient survey, I would appreciate you taking the time to let us know about your experience today. Thank you! 

## 2018-12-08 NOTE — Progress Notes (Signed)
Patient, No Pcp Per   Chief Complaint  Patient presents with  . Follow-up    Some vaginal irritation     HPI:      [name] is a 28 y.o. G1P0 who LMP was Patient's last menstrual period was 11/12/2018 (exact date)., presents today for increased d/c and irritation, with odor, for the past 4 days. Hx of recurrent BV and yeast. Had 4 types BV and AV on 10/27/18 culture. Treated with clindamycin, avelox, probiotics, started boric acid supp. Sx improved but have recurred. Pt didn't start boric acid supp right away with last tx and she thinks sx already recurred. Tried diflucan without sx relief given abx use. Pt uses dove sens skin soap, no dryer sheets.   She is now sex active with new partner, used condoms.   Past Medical History:  Diagnosis Date  . BV (bacterial vaginosis)   . Chlamydia   . Ectopic pregnancy     Past Surgical History:  Procedure Laterality Date  . NO PAST SURGERIES      Family History  Problem Relation Age of Onset  . Diabetes Mother   . Hypertension Mother     Social History   Socioeconomic History  . Marital status: Single    Spouse name: Not on file  . Number of children: Not on file  . Years of education: Not on file  . Highest education level: Not on file  Occupational History  . Not on file  Social Needs  . Financial resource strain: Not on file  . Food insecurity    Worry: Not on file    Inability: Not on file  . Transportation needs    Medical: Not on file    Non-medical: Not on file  Tobacco Use  . Smoking status: Never Smoker  . Smokeless tobacco: Never Used  Substance and Sexual Activity  . Alcohol use: Yes  . Drug use: Yes    Types: Marijuana  . Sexual activity: Yes    Birth control/protection: None  Lifestyle  . Physical activity    Days per week: Not on file    Minutes per session: Not on file  . Stress: Not on file  Relationships  . Social Herbalist on phone: Not on file    Gets together: Not  on file    Attends religious service: Not on file    Active member of club or organization: Not on file    Attends meetings of clubs or organizations: Not on file    Relationship status: Not on file  . Intimate partner violence    Fear of current or ex partner: Not on file    Emotionally abused: Not on file    Physically abused: Not on file    Forced sexual activity: Not on file  Other Topics Concern  . Not on file  Social History Narrative  . Not on file    Outpatient Medications Prior to Visit  Medication Sig Dispense Refill  . Boric Acid POWD by Does not apply route.    . ferrous sulfate 325 (65 FE) MG tablet Take 325 mg by mouth daily as needed (for low iron (sluggishness)).    . Multiple Vitamins-Calcium (ONE-A-DAY WOMENS PO) Take 1 tablet by mouth daily.    . Probiotic Product (PROBIOTIC-10 PO) Take by mouth.     No facility-administered medications prior to visit.       ROS:  Review of Systems  Constitutional:  Negative for fever.  Gastrointestinal: Negative for blood in stool, constipation, diarrhea, nausea and vomiting.  Genitourinary: Positive for vaginal discharge. Negative for dyspareunia, dysuria, flank pain, frequency, hematuria, urgency, vaginal bleeding and vaginal pain.  Musculoskeletal: Negative for back pain.  Skin: Negative for rash.   BREAST: No symptoms   OBJECTIVE:   Vitals:  BP 128/70   Ht 5\' 8"  (1.727 m)   Wt 228 lb (103.4 kg)   LMP 11/12/2018 (Exact Date)   BMI 34.67 kg/m   Physical Exam Vitals signs reviewed.  Constitutional:      Appearance: She is well-developed.  Neck:     Musculoskeletal: Normal range of motion.  Pulmonary:     Effort: Pulmonary effort is normal.  Genitourinary:    General: Normal vulva.     Pubic Area: No rash.      Labia:        Right: No rash, tenderness or lesion.        Left: No rash, tenderness or lesion.      Vagina: Vaginal discharge present. No erythema or tenderness.     Cervix: Normal.      Uterus: Normal. Not enlarged and not tender.      Adnexa: Right adnexa normal and left adnexa normal.       Right: No mass or tenderness.         Left: No mass or tenderness.    Musculoskeletal: Normal range of motion.  Skin:    General: Skin is warm and dry.  Neurological:     General: No focal deficit present.     Mental Status: She is alert and oriented to person, place, and time.  Psychiatric:        Mood and Affect: Mood normal.        Behavior: Behavior normal.        Thought Content: Thought content normal.        Judgment: Judgment normal.     Results: Results for orders placed or performed in visit on 12/08/18 (from the past 24 hour(s))  POCT Wet Prep with KOH     Status: Abnormal   Collection Time: 12/08/18 11:51 AM  Result Value Ref Range   Trichomonas, UA Negative    Clue Cells Wet Prep HPF POC pos    Epithelial Wet Prep HPF POC     Yeast Wet Prep HPF POC neg    Bacteria Wet Prep HPF POC     RBC Wet Prep HPF POC     WBC Wet Prep HPF POC     KOH Prep POC Negative Negative     Assessment/Plan: Bacterial vaginosis - Plan: clindamycin (CLEOCIN) 300 MG capsule, POCT Wet Prep with KOH; Pos sx/wet prep. Rx clindamycin, cont probiotics/boric acid supp. Try condoms without spermicide. F/u prn.   Screening for STD (sexually transmitted disease) - Plan: Cervicovaginal ancillary only    Meds ordered this encounter  Medications  . clindamycin (CLEOCIN) 300 MG capsule    Sig: Take 1 capsule (300 mg total) by mouth 2 (two) times daily for 7 days.    Dispense:  14 capsule    Refill:  0    Order Specific Question:   Supervising Provider    Answer:   Gae Dry [id number]      Return if symptoms worsen or fail to improve.  Alicia B. Copland, PA-C 12/08/2018 11:53 AM

## 2018-12-09 LAB — CERVICOVAGINAL ANCILLARY ONLY
Chlamydia: NEGATIVE
Comment: NEGATIVE
Comment: NORMAL
Neisseria Gonorrhea: NEGATIVE

## 2019-01-02 ENCOUNTER — Ambulatory Visit
Admission: EM | Admit: 2019-01-02 | Discharge: 2019-01-02 | Disposition: A | Discharge status: Home / Self Care | Payer: Medicaid Other | Attending: Emergency Medicine | Admitting: Emergency Medicine

## 2019-01-02 DIAGNOSIS — N898 Other specified noninflammatory disorders of vagina: Secondary | ICD-10-CM | POA: Diagnosis not present

## 2019-01-02 DIAGNOSIS — Z113 Encounter for screening for infections with a predominantly sexual mode of transmission: Secondary | ICD-10-CM | POA: Diagnosis not present

## 2019-01-02 DIAGNOSIS — Z3202 Encounter for pregnancy test, result negative: Secondary | ICD-10-CM | POA: Diagnosis not present

## 2019-01-02 DIAGNOSIS — Z202 Contact with and (suspected) exposure to infections with a predominantly sexual mode of transmission: Secondary | ICD-10-CM | POA: Diagnosis present

## 2019-01-02 LAB — POCT URINALYSIS DIP (MANUAL ENTRY)
Bilirubin, UA: NEGATIVE
Glucose, UA: NEGATIVE mg/dL
Ketones, POC UA: NEGATIVE mg/dL
Nitrite, UA: NEGATIVE
Protein Ur, POC: NEGATIVE mg/dL
Spec Grav, UA: >=1.03 — AB (ref 1.010–1.025)
Urobilinogen, UA: 0.2 E.U./dL
pH, UA: 5.5 (ref 5.0–8.0)

## 2019-01-02 LAB — POCT URINE PREGNANCY: Preg Test, Ur: NEGATIVE

## 2019-01-02 MED ORDER — METRONIDAZOLE 500 MG PO TABS: 500.0000 mg | ORAL_TABLET | Freq: Two times a day (BID) | ORAL | 0 refills | Status: DC

## 2019-01-02 MED ORDER — CEFTRIAXONE SODIUM 250 MG IJ SOLR
250.0000 mg | Freq: Once | INTRAMUSCULAR | Status: AC
  Administered 2019-01-02: 15:00:00 250 mg via INTRAMUSCULAR

## 2019-01-02 MED ORDER — AZITHROMYCIN 500 MG PO TABS
1000.0000 mg | ORAL_TABLET | Freq: Once | ORAL | Status: AC
  Administered 2019-01-02: 1000 mg via ORAL

## 2019-01-02 NOTE — Discharge Instructions (Addendum)
You were treated with two antibiotics today, Rocephin and Zithromax.  Additionally, you are prescribed metronidazole; take this twice a day for 7 days.    Do not have sex for 7 days.  Your STD tests are pending.  If your test results are positive, we will call you.  You may need additional treatment and your partner may also need treatment.

## 2019-01-02 NOTE — ED Triage Notes (Signed)
Pt presents with complaints of vaginal irritation x 2 days. Pt reports taking a plan B bill 5 days ago. Pt denies any vaginal discharge or odor. Reports 1 day of spotting.

## 2019-01-02 NOTE — ED Provider Notes (Signed)
Alicia Terry    CSN: [id number] Arrival date & time: 01/02/19  1438      History   Chief Complaint Chief Complaint  Patient presents with  . Vaginal Itching    HPI Alicia Terry is a 28 y.o. female.   Patient presents with yellow vaginal discharge x3 days.  She believes it is an allergic reaction to taking Plan B pill 5 days ago.  She denies fever, chills, abdominal pain, dysuria, back pain, pelvic pain, lesions, rash, or other symptoms.  She is sexually active and does not consistently use condoms.  Patient was treated with clindamycin for bacterial vaginosis on 12/08/2018 by her OB/GYN; She was treated with Diflucan on 12/01/2018.  LMP: 12/13/2018.  Patient's history is also significant for chlamydia.  The history is provided by the patient.    Past Medical History:  Diagnosis Date  . BV (bacterial vaginosis)   . Chlamydia   . Ectopic pregnancy     Patient Active Problem List   Diagnosis Date Noted  . Pregnancy of unknown anatomic location 08/19/2018  . Abdominal pain, lower 08/07/2018    Past Surgical History:  Procedure Laterality Date  . NO PAST SURGERIES      OB History    Gravida  1   Para      Term      Preterm      AB      Living        SAB      TAB      Ectopic      Multiple      Live Births               Home Medications    Prior to Admission medications   Medication Sig Start Date End Date Taking? Authorizing Provider  Boric Acid POWD by Does not apply route.    [provider]  ferrous sulfate 325 (65 FE) MG tablet Take 325 mg by mouth daily as needed (for low iron (sluggishness)).    [provider]  metroNIDAZOLE (FLAGYL) 500 MG tablet Take 1 tablet (500 mg total) by mouth 2 (two) times daily. 01/02/19   Sharion Balloon, NP  Multiple Vitamins-Calcium (ONE-A-DAY WOMENS PO) Take 1 tablet by mouth daily.    [provider]  Probiotic Product (PROBIOTIC-10 PO) Take by mouth.    [provider]    Family History Family History  Problem Relation Age of Onset  . Diabetes Mother   . Hypertension Mother   . Healthy Father     Social History Social History   Tobacco Use  . Smoking status: Never Smoker  . Smokeless tobacco: Never Used  Substance Use Topics  . Alcohol use: Yes  . Drug use: Yes    Types: Marijuana     Allergies   Patient has no known allergies.   Review of Systems Review of Systems  Constitutional: Negative for chills and fever.  HENT: Negative for ear pain and sore throat.   Eyes: Negative for pain and visual disturbance.  Respiratory: Negative for cough and shortness of breath.   Cardiovascular: Negative for chest pain and palpitations.  Gastrointestinal: Negative for abdominal pain and vomiting.  Genitourinary: Positive for vaginal discharge. Negative for dysuria, flank pain, hematuria and pelvic pain.  Musculoskeletal: Negative for arthralgias and back pain.  Skin: Negative for color change and rash.  Neurological: Negative for seizures and syncope.  All other systems reviewed and are negative.  Physical Exam Triage Vital Signs ED Triage Vitals [01/02/19 1439]  Enc Vitals Group     BP      Pulse      Resp      Temp      Temp src      SpO2      Weight      Height      Head Circumference      Peak Flow      Pain Score 0     Pain Loc      Pain Edu?      Excl. in [place]?    No data found.  Updated Vital Signs BP 125/83   Pulse 81   Temp 99.9 F (37.7 C) (Tympanic)   Resp 19   LMP 12/13/2018   SpO2 97%   Visual Acuity Right Eye Distance:   Left Eye Distance:   Bilateral Distance:    Right Eye Near:   Left Eye Near:    Bilateral Near:     Physical Exam Vitals signs and nursing note reviewed.  Constitutional:      General: She is not in acute distress.    Appearance: She is well-developed. She is not ill-appearing.  HENT:     Head: Normocephalic and atraumatic.     Mouth/Throat:     Mouth: Mucous  membranes are moist.     Pharynx: Oropharynx is clear.  Eyes:     Conjunctiva/sclera: Conjunctivae normal.  Neck:     Musculoskeletal: Neck supple.  Cardiovascular:     Rate and Rhythm: Normal rate and regular rhythm.     Heart sounds: No murmur.  Pulmonary:     Effort: Pulmonary effort is normal. No respiratory distress.     Breath sounds: Normal breath sounds.  Abdominal:     General: Bowel sounds are normal. There is no distension.     Palpations: Abdomen is soft.     Tenderness: There is no abdominal tenderness. There is no right CVA tenderness, left CVA tenderness, guarding or rebound.  Skin:    General: Skin is warm and dry.  Neurological:     General: No focal deficit present.     Mental Status: She is alert and oriented to person, place, and time.  Psychiatric:        Mood and Affect: Mood normal.        Behavior: Behavior normal.      UC Treatments / Results  Labs (all labs ordered are listed, but only abnormal results are displayed) Labs Reviewed  URINE CULTURE  POCT URINE PREGNANCY  POCT URINALYSIS DIP (MANUAL ENTRY)  CERVICOVAGINAL ANCILLARY ONLY    EKG   Radiology No results found.  Procedures Procedures (including critical care time)  Medications Ordered in UC Medications  azithromycin (ZITHROMAX) tablet 1,000 mg (has no administration in time range)  cefTRIAXone (ROCEPHIN) injection 250 mg (has no administration in time range)    Initial Impression / Assessment and Plan / UC Course  I have reviewed the triage vital signs and the nursing notes.  Pertinent labs & imaging results that were available during my care of the patient were reviewed by me and considered in my medical decision making (see chart for details).    Vaginal discharge.  Potential exposure to STD.  Urine pregnancy negative.  Urine culture pending.  Vaginal self swab obtained by patient.  Treated with Rocephin, Zithromax, metronidazole.  Instructed patient not to have sex for 7  days.  Discussed that  we will call her if her STD test are positive and that she and her partner may need additional treatment at that time.  Patient agrees to plan of care.     Final Clinical Impressions(s) / UC Diagnoses   Final diagnoses:  Vaginal discharge  Potential exposure to STD     Discharge Instructions     You were treated with two antibiotics today, Rocephin and Zithromax.  Additionally, you are prescribed metronidazole; take this twice a day for 7 days.    Do not have sex for 7 days.  Your STD tests are pending.  If your test results are positive, we will call you.  You may need additional treatment and your partner may also need treatment.         ED Prescriptions    Medication Sig Dispense Auth. Provider   metroNIDAZOLE (FLAGYL) 500 MG tablet Take 1 tablet (500 mg total) by mouth 2 (two) times daily. 14 tablet Sharion Balloon, NP     PDMP not reviewed this encounter.   Sharion Balloon, NP 01/02/19 [phone]

## 2019-01-04 LAB — URINE CULTURE: Culture: 80000 — AB

## 2019-01-05 ENCOUNTER — Telehealth (HOSPITAL_COMMUNITY): Payer: Self-pay | Admitting: Emergency Medicine

## 2019-01-05 MED ORDER — CEPHALEXIN 500 MG PO CAPS: 500.0000 mg | ORAL_CAPSULE | Freq: Two times a day (BID) | ORAL | 0 refills | Status: AC

## 2019-01-05 NOTE — Telephone Encounter (Signed)
Urine culture was positive for e coli. Contacted patient by phone to make her aware, pt c/o urinary frequency. Per Dr. Meda Coffee, send Keflex BID x5 days. Pt verbalized understanding, all questions answered. Pending cervical swab.

## 2019-01-06 LAB — CERVICOVAGINAL ANCILLARY ONLY
Bacterial vaginitis: POSITIVE — AB
Candida vaginitis: POSITIVE — AB
Chlamydia: NEGATIVE
Neisseria Gonorrhea: NEGATIVE
Trichomonas: NEGATIVE

## 2019-01-07 ENCOUNTER — Telehealth: Payer: Self-pay | Admitting: Emergency Medicine

## 2019-01-07 MED ORDER — FLUCONAZOLE 150 MG PO TABS: 150.0000 mg | ORAL_TABLET | Freq: Once | ORAL | 0 refills | Status: AC

## 2019-01-07 NOTE — Telephone Encounter (Signed)
Bacterial Vaginosis test is positive.  Prescription for metronidazole was given at the urgent care visit.  Test for candida (yeast) was positive.  Prescription for fluconazole 150mg  po now, repeat dose in 3d if needed, #2 no refills, sent to the pharmacy of record.  Recheck or followup with PCP for further evaluation if symptoms are not improving.    Patient contacted by phone and made aware of    results. Pt verbalized understanding and had all questions answered.

## 2019-01-22 ENCOUNTER — Telehealth: Payer: Self-pay

## 2019-01-22 NOTE — Telephone Encounter (Signed)
Pt adv of policy to go to ED.  She does not want to go to the ED d/t covid.  Assured pt it was safe - they are taking precautions.  Pt mentioned going to Urgent Care.  Adv she could.

## 2019-01-22 NOTE — Telephone Encounter (Signed)
Patient states she is having her 3rd period in 3 weeks. Today she is bleeding very heavy having to change her Kotex every hour. She is concerned she may have had a tubal pregnancy that ruptured and would like to be seen. [phone]

## 2019-02-06 NOTE — L&D Delivery Note (Signed)
Delivery Note At 3:17 PM a pre-viable female was delivered via Vaginal, Spontaneous (Presentation: cephalic).  APGAR: none assigned; weight  pending.   Placenta status: Spontaneous, Intact.  Cord: Unknown with the following complications: None.  Cord pH: n/a  Anesthesia: None Episiotomy: None Lacerations: None Suture Repair: n/a Est. Blood Loss (mL):  350   Mom to postpartum.  Baby to [place].  Placenta found in vagina about 2 hours after delivery.  The placenta is firm and rubbery and appears intact.  Lochia is appropriate at this time.  Prentice Docker, MD 07/21/2019, 5:24 PM

## 2019-02-09 ENCOUNTER — Ambulatory Visit: Payer: Medicaid Other | Admitting: Obstetrics and Gynecology

## 2019-02-09 ENCOUNTER — Other Ambulatory Visit (HOSPITAL_COMMUNITY)
Admission: RE | Admit: 2019-02-09 | Discharge: 2019-02-09 | Disposition: A | Discharge status: Home / Self Care | Payer: Medicaid Other | Source: Ambulatory Visit | Attending: Obstetrics and Gynecology | Admitting: Obstetrics and Gynecology

## 2019-02-09 ENCOUNTER — Encounter: Payer: Self-pay | Admitting: Obstetrics and Gynecology

## 2019-02-09 ENCOUNTER — Other Ambulatory Visit: Payer: Self-pay

## 2019-02-09 VITALS — BP 136/86 | HR 89 | Ht 68.0 in | Wt 236.0 lb

## 2019-02-09 DIAGNOSIS — N939 Abnormal uterine and vaginal bleeding, unspecified: Secondary | ICD-10-CM | POA: Diagnosis not present

## 2019-02-09 DIAGNOSIS — Z113 Encounter for screening for infections with a predominantly sexual mode of transmission: Secondary | ICD-10-CM | POA: Diagnosis present

## 2019-02-09 DIAGNOSIS — Z124 Encounter for screening for malignant neoplasm of cervix: Secondary | ICD-10-CM

## 2019-02-09 MED ORDER — MEDROXYPROGESTERONE ACETATE 10 MG PO TABS: 10.0000 mg | ORAL_TABLET | Freq: Every day | ORAL | 0 refills | Status: DC

## 2019-02-09 MED ORDER — METRONIDAZOLE 500 MG PO TABS: 500.0000 mg | ORAL_TABLET | Freq: Two times a day (BID) | ORAL | 0 refills | Status: AC

## 2019-02-09 NOTE — Progress Notes (Signed)
Gynecology Abnormal Uterine Bleeding Initial Evaluation   Chief Complaint:  Chief Complaint  Patient presents with  . Gynecologic Exam    abnormal/frequent periods    History of Present Illness:    Paitient is a 29 y.o. G1P0 who LMP was Patient's last menstrual period was 01/29/2019., presents today for a problem visit.  She complains of menometrorrhagia that  began about a month ago and its severity is described as moderate.  The patient menstrual complaints are acute.  Has had menstrual frlow twice monthly or about every 2 weeks since November.  The are associated with no menstrual cramping.  The patient is sexually active. She currently uses condomsfor contraception.   Previous evaluation: Ultrasound 6 months ago for ectopic (tx'ed with MTX showed two small uterine fibroid) . Previous Treatment: none.  Has also had issues with recurrent BV and reports current symptoms.    LMP: Patient's last menstrual period was 01/29/2019.   Paramter Normal / Abnormal Prsent  Frequency Amenoorhea     Infrequent (>38 days)     Normal (?24 days ?38 days)     Freequent (<24 days) X  Duration Normal (?8 days) X   Prolonged (>8 days)    Regularity Regular (shortest to longest cycle variation ?7-9 days)*     Irregular (shortest to longest cycle variation ?8-10days)* X  Flow Volume Light    (Self reported) Normal X   Heavy        Intermenstrual Bleeding None X   Random     Cyclical early     Cyclical mid     Cyclical late        Unscheduled Bleeding  Not applicable X  (exogenous hormones) Absent     Present     FIGO AUB I System: *The available evidence suggests that, using these criteria, the normal range (shortest to longest) varies with age: 47-25 y of age, ?35 d; 41-41 y, ?7 d; and for 52-45 y, ?9 d    Review of Systems: ROS  Past Medical History:  Past Medical History:  Diagnosis Date  . BV (bacterial vaginosis)   . Chlamydia   . Ectopic pregnancy     Past Surgical  History:  Past Surgical History:  Procedure Laterality Date  . NO PAST SURGERIES      Obstetric History: G1P0  Family History:  Family History  Problem Relation Age of Onset  . Diabetes Mother   . Hypertension Mother   . Healthy Father     Social History:  Social History   Socioeconomic History  . Marital status: Single    Spouse name: Not on file  . Number of children: Not on file  . Years of education: Not on file  . Highest education level: Not on file  Occupational History  . Not on file  Tobacco Use  . Smoking status: Never Smoker  . Smokeless tobacco: Never Used  Substance and Sexual Activity  . Alcohol use: Yes  . Drug use: Yes    Types: Marijuana  . Sexual activity: Yes    Birth control/protection: None  Other Topics Concern  . Not on file  Social History Narrative  . Not on file   Social Determinants of Health   Financial Resource Strain:   . Difficulty of Paying Living Expenses: Not on file  Food Insecurity:   . Worried About Charity fundraiser in the Last Year: Not on file  . Ran Out of Food in the Last  Year: Not on file  Transportation Needs:   . Lack of Transportation (Medical): Not on file  . Lack of Transportation (Non-Medical): Not on file  Physical Activity:   . Days of Exercise per Week: Not on file  . Minutes of Exercise per Session: Not on file  Stress:   . Feeling of Stress : Not on file  Social Connections:   . Frequency of Communication with Friends and Family: Not on file  . Frequency of Social Gatherings with Friends and Family: Not on file  . Attends Religious Services: Not on file  . Active Member of Clubs or Organizations: Not on file  . Attends Archivist Meetings: Not on file  . Marital Status: Not on file  Intimate Partner Violence:   . Fear of Current or Ex-Partner: Not on file  . Emotionally Abused: Not on file  . Physically Abused: Not on file  . Sexually Abused: Not on file    Allergies:  No Known  Allergies  Medications: Prior to Admission medications   Medication Sig Start Date End Date Taking? Authorizing Provider  Multiple Vitamins-Calcium (ONE-A-DAY WOMENS PO) Take 1 tablet by mouth daily.   Yes [provider]  Boric Acid POWD by Does not apply route.    [provider]  ferrous sulfate 325 (65 FE) MG tablet Take 325 mg by mouth daily as needed (for low iron (sluggishness)).    [provider]  medroxyPROGESTERone (PROVERA) 10 MG tablet Take 1 tablet (10 mg total) by mouth daily. Use for ten days 02/09/19   Malachy Mood, MD  metroNIDAZOLE (FLAGYL) 500 MG tablet Take 1 tablet (500 mg total) by mouth 2 (two) times daily for 7 days. 02/09/19 02/16/19  Malachy Mood, MD  Probiotic Product (PROBIOTIC-10 PO) Take by mouth.    [provider]    Physical Exam Blood pressure 136/86, pulse 89, height 5\' 8"  (1.727 m), weight 236 lb (107 kg), last menstrual period 01/29/2019.  Patient's last menstrual period was 01/29/2019.  General: NAD HEENT: normocephalic, anicteric Thyroid: no enlargement, no palpable nodules Pulmonary: No increased work of breathing Cardiovascular: RRR, distal pulses 2+ Abdomen: NABS, soft, non-tender, non-distended.  Umbilicus without lesions.  No hepatomegaly, splenomegaly or masses palpable. No evidence of hernia  Genitourinary:  External: Normal external female genitalia.  Normal urethral meatus, normal Bartholin's and Skene's glands.    Vagina: Normal vaginal mucosa, no evidence of prolapse.    Cervix: Grossly normal in appearance, no bleeding  Uterus: Non-enlarged, mobile, normal contour.  No CMT  Adnexa: ovaries non-enlarged, no adnexal masses  Rectal: deferred  Lymphatic: no evidence of inguinal lymphadenopathy Extremities: no edema, erythema, or tenderness Neurologic: Grossly intact Psychiatric: mood appropriate, affect full  Female chaperone present for pelvic portions of the physical exam  Assessment: 29  y.o. G1P0 with abnormal uterine bleeding  Plan: Problem List Items Addressed This Visit    None    Visit Diagnoses    Screening for malignant neoplasm of cervix    -  Primary   Relevant Orders   Cytology - PAP   Abnormal uterine bleeding       Relevant Orders   PCOS panel (TSH+PRL+FSH+TESTT+LH+DHEA S...)   hCG, quantitative, pregnancy   Routine screening for STI (sexually transmitted infection)       Relevant Orders   Cytology - PAP      1) Discussed management options for abnormal uterine bleeding including expectant, NSAIDs, tranexamic acid (Lysteda), oral progesterone (Provera, norethindrone, megace), Depo Provera,  Levonorgestrel containing IUD, endometrial ablation (Novasure) or hysterectomy as definitive surgical management.  Discussed risks and benefits of each method.   Final management decision will hinge on results of patient's work up and whether an underlying etiology for the patients bleeding symptoms can be discerned.  We will conduct a basic work up examining using the PALM-COIEN classification system.  In the meantime the patient opts to trial provera 10mg  x 10 days while we await results of her labs.  The role of unopposed estrogen in the development of endometrial hyperplasia or carcinoma is discussed.  The risk of endometrial hyperplasia is linearly correlated with increasing BMI given the production of estrone by adipose tissue. Printed patient education handouts were given to the patient to review at home.  Bleeding precautions reviewed.   2) BV - rx flagyl, samples of Hylafem  3) A total of 15 minutes were spent in face-to-face contact with the patient during this encounter with over half of that time devoted to counseling and coordination of care.  4)  Return if symptoms worsen or fail to improve.   Malachy Mood, MD, [facility], [facility] 02/09/2019, 1:07 PM

## 2019-02-09 NOTE — Progress Notes (Signed)
Patient reports having multiple periods in this past month. Heavy bleeding.

## 2019-02-11 LAB — CYTOLOGY - PAP
Adequacy: ABSENT
Chlamydia: NEGATIVE
Comment: NEGATIVE
Comment: NEGATIVE
Comment: NORMAL
Diagnosis: NEGATIVE
High risk HPV: NEGATIVE
Neisseria Gonorrhea: NEGATIVE

## 2019-02-14 LAB — TSH+PRL+FSH+TESTT+LH+DHEA S...
17-Hydroxyprogesterone: 120 ng/dL
Androstenedione: 91 ng/dL (ref 41–262)
DHEA-SO4: 207 ug/dL (ref 84.8–378.0)
FSH: 7.5 m[IU]/mL
LH: 16.4 m[IU]/mL
Prolactin: 16.3 ng/mL (ref 4.8–23.3)
TSH: 0.711 u[IU]/mL (ref 0.450–4.500)
Testosterone, Free: 1.2 pg/mL (ref 0.0–4.2)
Testosterone: 15 ng/dL (ref 8–48)

## 2019-02-14 LAB — BETA HCG QUANT (REF LAB): hCG Quant: <1 m[IU]/mL

## 2019-03-02 ENCOUNTER — Other Ambulatory Visit: Payer: Self-pay | Admitting: Obstetrics and Gynecology

## 2019-03-02 MED ORDER — METRONIDAZOLE 500 MG PO TABS: 500.0000 mg | ORAL_TABLET | Freq: Two times a day (BID) | ORAL | 0 refills | Status: AC

## 2019-03-25 ENCOUNTER — Ambulatory Visit (INDEPENDENT_AMBULATORY_CARE_PROVIDER_SITE_OTHER): Payer: Medicaid Other | Admitting: Obstetrics & Gynecology

## 2019-03-25 ENCOUNTER — Other Ambulatory Visit: Payer: Self-pay

## 2019-03-25 ENCOUNTER — Encounter: Payer: Self-pay | Admitting: Obstetrics & Gynecology

## 2019-03-25 VITALS — BP 120/80 | Ht 68.0 in | Wt 236.0 lb

## 2019-03-25 DIAGNOSIS — N925 Other specified irregular menstruation: Secondary | ICD-10-CM | POA: Diagnosis not present

## 2019-03-25 DIAGNOSIS — N939 Abnormal uterine and vaginal bleeding, unspecified: Secondary | ICD-10-CM

## 2019-03-25 DIAGNOSIS — Z8759 Personal history of other complications of pregnancy, childbirth and the puerperium: Secondary | ICD-10-CM

## 2019-03-25 NOTE — Progress Notes (Signed)
  Missed Menses/ Possible Pregnancy Patient is a 29 yo G1P0010 who complains of menstrual irregularity. She believes she could be pregnant. Patient is ambivalent about pregnancy. Sexual Activity: single partner, contraception: none. Current symptoms also include: breast tenderness, fatigue and nausea. Last period was irreg and induced w Provera as she was having too many periods w prolonged bleeding.  Pos home uCG yesterday.  No pain or bleeding.   Patient's last menstrual period was 02/20/2019.   She has prior h/o Ectopic Pregnancy last summer treated w MTX.  PMHx: She  has a past medical history of BV (bacterial vaginosis), Chlamydia, and Ectopic pregnancy. Also,  has a past surgical history that includes No past surgeries., family history includes Diabetes in her mother; Healthy in her father; Hypertension in her mother.,  reports that she has never smoked. She has never used smokeless tobacco. She reports current alcohol use. She reports current drug use. Drug: Marijuana.  She has a current medication list which includes the following prescription(s): ferrous sulfate, boric acid, multiple vitamins-minerals, and probiotic product. Also, has No Known Allergies.  Review of Systems  All other systems reviewed and are negative.   Objective: BP 120/80   Ht 5\' 8"  (1.727 m)   Wt 236 lb (107 kg)   LMP 02/20/2019   BMI 35.88 kg/m  Physical Exam Constitutional:      General: She is not in acute distress.    Appearance: She is well-developed.  Musculoskeletal:        General: Normal range of motion.  Neurological:     Mental Status: She is alert and oriented to person, place, and time.  Skin:    General: Skin is warm and dry.  Vitals reviewed.     ASSESSMENT/PLAN:    Problem List Items Addressed This Visit      Other   History of ectopic pregnancy   Relevant Orders   Beta hCG quant (ref lab)   Beta hCG quant (ref lab) Friday    Other Visit Diagnoses    Abnormal uterine  bleeding       Relevant Orders   Beta hCG quant (ref lab)   Beta hCG quant (ref lab) Friday    Serial Beta hCG levels [place] when >2000 Risks for repeat ectopic discussed PNV Plan NOB only after documented IUP  Pt expresses concerns as she is supposed to go to jail for an indeterminate amount of time starting today.  A letter is provided to her for the court system today detailing the pregnancy and the need for ongoing labs and ultrasounds and monitoring and visits.  A total of 25 minutes were spent face-to-face with the patient as well as preparation, review, communication, and documentation during this encounter.   Barnett Applebaum, MD, [facility], [facility] 03/25/2019  8:33 AM

## 2019-03-26 LAB — BETA HCG QUANT (REF LAB): hCG Quant: 213 m[IU]/mL

## 2019-03-27 ENCOUNTER — Other Ambulatory Visit: Payer: Medicaid Other

## 2019-03-30 ENCOUNTER — Telehealth: Payer: Self-pay

## 2019-03-30 NOTE — Telephone Encounter (Signed)
Spoke w/Alicia Terry. Advised that while she is on DPR, we are only authorized to disclose information that is done at our office. She states patient is concerned about lab results and wants to verify they are being followed as she had to have a D&C last time. Advised will send message to [facility] for his review.

## 2019-03-30 NOTE — Telephone Encounter (Signed)
Patient's sister Alver Fisher is calling on behalf of patient who is incarcerated. They are inquiring if Scott is receiving her lab work that is being drawn at the jail. States jail is not informing patient if they are sending it. [phone]

## 2019-03-31 ENCOUNTER — Other Ambulatory Visit: Payer: Self-pay | Admitting: Obstetrics & Gynecology

## 2019-03-31 NOTE — Telephone Encounter (Signed)
Spoke w/[name] in Medical at [facility] to relay [facility] response of results normal & patient needs u/s in 2 weeks. Manuela Schwartz is inquiring if the patient will be considered HROB? She states patient has been sentenced and is awaiting to be transported to prison. If she is HROB, they can get her transferred sooner.

## 2019-03-31 NOTE — Telephone Encounter (Signed)
Let them know (?) that these are good levels.  Normal early pregnancy.  Needs [place] in 2 weeks

## 2019-03-31 NOTE — Telephone Encounter (Signed)
Need beta hCG from Friday (ordered, not sure if done, do not see results)

## 2019-04-02 NOTE — Telephone Encounter (Signed)
No high risk factors other than ruling out ectopic pregnancy at this time.

## 2019-04-02 NOTE — Telephone Encounter (Signed)
Notified [name] [facility].

## 2019-07-09 ENCOUNTER — Ambulatory Visit (INDEPENDENT_AMBULATORY_CARE_PROVIDER_SITE_OTHER): Payer: Medicaid Other | Admitting: Obstetrics & Gynecology

## 2019-07-09 ENCOUNTER — Encounter: Payer: Self-pay | Admitting: Obstetrics & Gynecology

## 2019-07-09 ENCOUNTER — Other Ambulatory Visit (HOSPITAL_COMMUNITY)
Admission: RE | Admit: 2019-07-09 | Discharge: 2019-07-09 | Disposition: A | Discharge status: Home / Self Care | Payer: Medicaid Other | Source: Ambulatory Visit | Attending: Obstetrics & Gynecology | Admitting: Obstetrics & Gynecology

## 2019-07-09 ENCOUNTER — Encounter: Payer: Medicaid Other | Admitting: Obstetrics

## 2019-07-09 ENCOUNTER — Other Ambulatory Visit: Payer: Self-pay

## 2019-07-09 VITALS — BP 100/70 | Wt 229.0 lb

## 2019-07-09 DIAGNOSIS — R112 Nausea with vomiting, unspecified: Secondary | ICD-10-CM

## 2019-07-09 DIAGNOSIS — N912 Amenorrhea, unspecified: Secondary | ICD-10-CM | POA: Diagnosis not present

## 2019-07-09 DIAGNOSIS — Z113 Encounter for screening for infections with a predominantly sexual mode of transmission: Secondary | ICD-10-CM | POA: Insufficient documentation

## 2019-07-09 DIAGNOSIS — Z3492 Encounter for supervision of normal pregnancy, unspecified, second trimester: Secondary | ICD-10-CM

## 2019-07-09 DIAGNOSIS — Z3A19 19 weeks gestation of pregnancy: Secondary | ICD-10-CM

## 2019-07-09 DIAGNOSIS — Z369 Encounter for antenatal screening, unspecified: Secondary | ICD-10-CM

## 2019-07-09 DIAGNOSIS — Z3689 Encounter for other specified antenatal screening: Secondary | ICD-10-CM

## 2019-07-09 NOTE — Patient Instructions (Signed)

## 2019-07-09 NOTE — Progress Notes (Signed)
07/09/2019   Chief Complaint: Missed period  Transfer of Care Patient: Pt has been seen while incarcerated in [place], has had some testing done, no records w her or accessible today  History of Present Illness: [name] is a 29 y.o. G2P0010 [redacted]w[redacted]d based on Patient's last menstrual period was 02/20/2019. with an Estimated Date of Delivery: 11/27/19, with the above CC.   Her periods were: regular periods every 28 days She was using no method when she conceived.  She has Positive signs or symptoms of nausea/vomiting of pregnancy. She has Negative signs or symptoms of miscarriage or preterm labor She identifies Negative Zika risk factors for her and her partner On any different medications around the time she conceived/early pregnancy: No  History of varicella: Yes   ROS: A 12-point review of systems was performed and negative, except as stated in the above HPI.  OBGYN History: As per HPI. OB History  Gravida Para Term Preterm AB Living  2       1    SAB TAB Ectopic Multiple Live Births      1        # Outcome Date GA Lbr Len/2nd Weight Sex Delivery Anes PTL Lv  2 Current           1 Ectopic             Any issues with any prior pregnancies: Prior ECTOPIC last year Any prior children are healthy, doing well, without any problems or issues: no History of pap smears: Yes. Last pap smear 02/2019. Abnormal: no  History of STIs: No   Past Medical History: Past Medical History:  Diagnosis Date  . BV (bacterial vaginosis)   . Chlamydia   . Ectopic pregnancy     Past Surgical History: Past Surgical History:  Procedure Laterality Date  . NO PAST SURGERIES      Family History:  Family History  Problem Relation Age of Onset  . Diabetes Mother   . Hypertension Mother   . Healthy Father    She denies any female cancers, bleeding or blood clotting disorders.  She denies any history of mental retardation, birth defects or genetic disorders in her or the FOB's history  Social  History:  Social History   Socioeconomic History  . Marital status: Single    Spouse name: Not on file  . Number of children: Not on file  . Years of education: Not on file  . Highest education level: Not on file  Occupational History  . Not on file  Tobacco Use  . Smoking status: Never Smoker  . Smokeless tobacco: Never Used  Substance and Sexual Activity  . Alcohol use: Yes  . Drug use: Yes    Types: Marijuana  . Sexual activity: Yes    Birth control/protection: None  Other Topics Concern  . Not on file  Social History Narrative  . Not on file   Social Determinants of Health   Financial Resource Strain:   . Difficulty of Paying Living Expenses:   Food Insecurity:   . Worried About Charity fundraiser in the Last Year:   . Arboriculturist in the Last Year:   Transportation Needs:   . Film/video editor (Medical):   Marland Kitchen Lack of Transportation (Non-Medical):   Physical Activity:   . Days of Exercise per Week:   . Minutes of Exercise per Session:   Stress:   . Feeling of Stress :   Social Connections:   .  Frequency of Communication with Friends and Family:   . Frequency of Social Gatherings with Friends and Family:   . Attends Religious Services:   . Active Member of Clubs or Organizations:   . Attends Archivist Meetings:   Marland Kitchen Marital Status:   Intimate Partner Violence:   . Fear of Current or Ex-Partner:   . Emotionally Abused:   Marland Kitchen Physically Abused:   . Sexually Abused:    Any pets in the household: no  Allergy: No Known Allergies  Current Outpatient Medications:  Current Outpatient Medications:  .  ferrous sulfate 325 (65 FE) MG tablet, Take 325 mg by mouth daily as needed (for low iron (sluggishness))., Disp: , Rfl:  .  Prenatal Vit-Fe Fumarate-FA (PRENATAL MULTIVITAMIN) TABS tablet, Take 1 tablet by mouth daily at 12 noon., Disp: , Rfl:  .  Boric Acid POWD, by Does not apply route., Disp: , Rfl:  .  Multiple Vitamins-Calcium (ONE-A-DAY  WOMENS PO), Take 1 tablet by mouth daily., Disp: , Rfl:  .  Probiotic Product (PROBIOTIC-10 PO), Take by mouth., Disp: , Rfl:    Physical Exam:   BP 100/70   Wt 229 lb (103.9 kg)   LMP 02/20/2019   BMI 34.82 kg/m  Body mass index is 34.82 kg/m. Constitutional: Well nourished, well developed female in no acute distress.  Neck:  Supple, normal appearance, and no thyromegaly  Cardiovascular: S1, S2 normal, no murmur, rub or gallop, regular rate and rhythm Respiratory:  Clear to auscultation bilateral. Normal respiratory effort Abdomen: positive bowel sounds and no masses, hernias; diffusely non tender to palpation, non distended Breasts: breasts appear normal, no suspicious masses, no skin or nipple changes or axillary nodes. Neuro/Psych:  Normal mood and affect.  Skin:  Warm and dry.  Lymphatic:  No inguinal lymphadenopathy.   Pelvic exam: is not limited by body habitus EGBUS: within normal limits, Vagina: within normal limits and with no blood in the vault, Cervix: normal appearing cervix without discharge or lesions, closed/long/high, Uterus:  enlarged: 20 weeks, and Adnexa:  no mass, fullness, tenderness  Assessment: [name] is a 29 y.o. G2P0010 [redacted]w[redacted]d based on Patient's last menstrual period was 02/20/2019. with an Estimated Date of Delivery: 11/27/19,  for prenatal care.  Plan:  1) Avoid alcoholic beverages. 2) Patient encouraged not to smoke.  3) Discontinue the use of all non-medicinal drugs and chemicals.  4) Take prenatal vitamins daily.  5) Seatbelt use advised 6) Nutrition, food safety (fish, cheese advisories, and high nitrite foods) and exercise discussed. 7) Hospital and practice style delivering at [facility] discussed  8) Patient is asked about travel to areas at risk for the Cowarts virus, and counseled to avoid travel and exposure to mosquitoes or sexual partners who may have themselves been exposed to the virus. Testing is discussed, and will be ordered as appropriate.   9) Childbirth classes at [facility] advised 10) Genetic Screening, such as with 1st Trimester Screening, cell free fetal DNA, AFP testing, and Ultrasound, as well as with amniocentesis and CVS as appropriate, is discussed with patient. She plans to already have genetic testing this pregnancy. 11) [place] soon  Problem list reviewed and updated.  Barnett Applebaum, MD, [facility], [facility] 07/09/2019  10:49 AM

## 2019-07-10 LAB — CERVICOVAGINAL ANCILLARY ONLY
Chlamydia: NEGATIVE
Comment: NEGATIVE
Comment: NEGATIVE
Comment: NORMAL
Neisseria Gonorrhea: NEGATIVE
Trichomonas: NEGATIVE

## 2019-07-11 LAB — URINE CULTURE: Organism ID, Bacteria: NO GROWTH

## 2019-07-13 LAB — RPR+RH+ABO+RUB AB+AB SCR+CB...
Antibody Screen: NEGATIVE
HIV Screen 4th Generation wRfx: NONREACTIVE
Hematocrit: 34.2 % (ref 34.0–46.6)
Hemoglobin: 11 g/dL — ABNORMAL LOW (ref 11.1–15.9)
Hepatitis B Surface Ag: NEGATIVE
MCH: 27.3 pg (ref 26.6–33.0)
MCHC: 32.2 g/dL (ref 31.5–35.7)
MCV: 85 fL (ref 79–97)
Platelets: 203 10*3/uL (ref 150–450)
RBC: 4.03 x10E6/uL (ref 3.77–5.28)
RDW: 13.2 % (ref 11.7–15.4)
RPR Ser Ql: NONREACTIVE
Rh Factor: POSITIVE
Rubella Antibodies, IGG: 2.66 index (ref >0.99)
Varicella zoster IgG: 1203 index (ref >165)
WBC: 6.7 10*3/uL (ref 3.4–10.8)

## 2019-07-13 LAB — HGB FRACTIONATION CASCADE
Hgb A2: 2.7 % (ref 1.8–3.2)
Hgb A: 97.3 % (ref 96.4–98.8)
Hgb F: 0 % (ref 0.0–2.0)
Hgb S: 0 %

## 2019-07-15 ENCOUNTER — Other Ambulatory Visit: Payer: Self-pay

## 2019-07-15 ENCOUNTER — Observation Stay
Admission: EM | Admit: 2019-07-15 | Discharge: 2019-07-15 | Disposition: A | Discharge status: Home / Self Care | Payer: Medicaid Other | Attending: Obstetrics & Gynecology | Admitting: Obstetrics & Gynecology

## 2019-07-15 ENCOUNTER — Encounter: Payer: Self-pay | Admitting: Obstetrics & Gynecology

## 2019-07-15 DIAGNOSIS — Z3490 Encounter for supervision of normal pregnancy, unspecified, unspecified trimester: Secondary | ICD-10-CM | POA: Diagnosis present

## 2019-07-15 DIAGNOSIS — Z3A20 20 weeks gestation of pregnancy: Secondary | ICD-10-CM | POA: Diagnosis not present

## 2019-07-15 DIAGNOSIS — Z3482 Encounter for supervision of other normal pregnancy, second trimester: Principal | ICD-10-CM | POA: Insufficient documentation

## 2019-07-15 NOTE — Discharge Instructions (Signed)
Continue with scheduled appointments. May take Tylenol 1,000mg  every 6 hours as needed for muscular pain

## 2019-07-15 NOTE — Discharge Summary (Signed)
See FPN

## 2019-07-15 NOTE — Final Progress Note (Signed)
Physician Final Progress Note  Patient ID: Alicia Terry MRN: [id number] DOB/AGE: 1990/08/27 29 y.o.  Admit date: 07/15/2019 Admitting provider: Gae Dry, MD Discharge date: 07/15/2019   Admission Diagnoses: Lower abdominal pain in pregnancy, second trimester  Discharge Diagnoses:  Active Problems:   Pregnant   Same   No preterm labor  Consults: None  Significant Findings/ Diagnostic Studies: Patient presented for evaluation of labor, with mild lower abdominal pain that she felt concerning for contractions.  Patient had cervical exam by RN and this was reported to me, along with fetal monitoring and monitoring for contractions and UA. I reviewed her vital signs and fetal tracing, both of which were reassuring.  Patient was discharge as she was not laboring.  No UTI.  Procedures: FHR monitoring (20 weeks, no NST done.)  Discharge Condition: good  Disposition: Discharge disposition: 01-Home or Self Care       Diet: Regular diet  Discharge Activity: Activity as tolerated   Allergies as of 07/15/2019   No Known Allergies     Medication List    TAKE these medications   Boric Acid Powd by Does not apply route.   ferrous sulfate 325 (65 FE) MG tablet Take 325 mg by mouth daily as needed (for low iron (sluggishness)).   ONE-A-DAY WOMENS PO Take 1 tablet by mouth daily.   prenatal multivitamin Tabs tablet Take 1 tablet by mouth daily at 12 noon.   PROBIOTIC-10 PO Take by mouth.        Total time spent taking care of this patient: TRIAGE  Signed: Hoyt Koch 07/15/2019, 1:48 AM

## 2019-07-15 NOTE — OB Triage Note (Addendum)
Pt is G2 P0 at [redacted]w[redacted]d with complaints of pain and pressure in lower abd with movement. Pt denies lifting anything heavy or any change in her routine. At rest she is comfortable.

## 2019-07-21 ENCOUNTER — Other Ambulatory Visit: Payer: Self-pay

## 2019-07-21 ENCOUNTER — Inpatient Hospital Stay: Payer: Medicaid Other

## 2019-07-21 ENCOUNTER — Encounter: Payer: Medicaid Other | Admitting: Certified Nurse Midwife

## 2019-07-21 ENCOUNTER — Inpatient Hospital Stay
Admission: AD | Admit: 2019-07-21 | Discharge: 2019-07-22 | DRG: 805 | Disposition: A | Discharge status: Home / Self Care | Payer: Medicaid Other | Attending: Certified Nurse Midwife | Admitting: Certified Nurse Midwife

## 2019-07-21 ENCOUNTER — Other Ambulatory Visit: Payer: Medicaid Other

## 2019-07-21 ENCOUNTER — Encounter: Payer: Self-pay | Admitting: Advanced Practice Midwife

## 2019-07-21 DIAGNOSIS — O9852 Other viral diseases complicating childbirth: Secondary | ICD-10-CM | POA: Diagnosis present

## 2019-07-21 DIAGNOSIS — U071 COVID-19: Secondary | ICD-10-CM | POA: Diagnosis present

## 2019-07-21 DIAGNOSIS — Z3A21 21 weeks gestation of pregnancy: Secondary | ICD-10-CM

## 2019-07-21 DIAGNOSIS — O9081 Anemia of the puerperium: Secondary | ICD-10-CM | POA: Diagnosis not present

## 2019-07-21 DIAGNOSIS — O4692 Antepartum hemorrhage, unspecified, second trimester: Secondary | ICD-10-CM

## 2019-07-21 DIAGNOSIS — O364XX0 Maternal care for intrauterine death, not applicable or unspecified: Secondary | ICD-10-CM | POA: Diagnosis present

## 2019-07-21 LAB — CBC
HCT: 30 % — ABNORMAL LOW (ref 36.0–46.0)
Hemoglobin: 10.8 g/dL — ABNORMAL LOW (ref 12.0–15.0)
MCH: 32 pg (ref 26.0–34.0)
MCHC: 36 g/dL (ref 30.0–36.0)
MCV: 88.8 fL (ref 80.0–100.0)
Platelets: 132 10*3/uL — ABNORMAL LOW (ref 150–400)
RBC: 3.38 MIL/uL — ABNORMAL LOW (ref 3.87–5.11)
RDW: 13.5 % (ref 11.5–15.5)
WBC: 6 10*3/uL (ref 4.0–10.5)
nRBC: 0 % (ref 0.0–0.2)

## 2019-07-21 LAB — URINE DRUG SCREEN, QUALITATIVE (ARMC ONLY)
Amphetamines, Ur Screen: NOT DETECTED
Barbiturates, Ur Screen: NOT DETECTED
Benzodiazepine, Ur Scrn: NOT DETECTED
Cannabinoid 50 Ng, Ur ~~LOC~~: NOT DETECTED
Cocaine Metabolite,Ur ~~LOC~~: NOT DETECTED
MDMA (Ecstasy)Ur Screen: NOT DETECTED
Methadone Scn, Ur: NOT DETECTED
Opiate, Ur Screen: NOT DETECTED
Phencyclidine (PCP) Ur S: NOT DETECTED
Tricyclic, Ur Screen: NOT DETECTED

## 2019-07-21 LAB — TYPE AND SCREEN
ABO/RH(D): O POS
Antibody Screen: NEGATIVE

## 2019-07-21 LAB — KLEIHAUER-BETKE STAIN
Fetal Cells %: 0 %
Quantitation Fetal Hemoglobin: 0 mL

## 2019-07-21 LAB — RUPTURE OF MEMBRANE (ROM)PLUS: Rom Plus: POSITIVE

## 2019-07-21 LAB — SARS CORONAVIRUS 2 BY RT PCR (HOSPITAL ORDER, PERFORMED IN ~~LOC~~ HOSPITAL LAB): SARS Coronavirus 2: POSITIVE — AB

## 2019-07-21 LAB — ABO/RH: ABO/RH(D): O POS

## 2019-07-21 LAB — RPR: RPR Ser Ql: NONREACTIVE

## 2019-07-21 LAB — FIBRINOGEN: Fibrinogen: 232 mg/dL (ref 210–475)

## 2019-07-21 MED ORDER — ONDANSETRON HCL 4 MG PO TABS: 4.0000 mg | ORAL_TABLET | ORAL | Status: DC | PRN

## 2019-07-21 MED ORDER — MISOPROSTOL 200 MCG PO TABS
ORAL_TABLET | ORAL | Status: AC
  Filled 2019-07-21: qty 4

## 2019-07-21 MED ORDER — COCONUT OIL OIL: 1.0000 "application " | TOPICAL_OIL | Status: DC | PRN

## 2019-07-21 MED ORDER — OXYTOCIN-SODIUM CHLORIDE 30-0.9 UT/500ML-% IV SOLN
2.5000 [IU]/h | INTRAVENOUS | Status: DC
  Administered 2019-07-21: 2.5 [IU]/h via INTRAVENOUS
  Filled 2019-07-21: qty 1000

## 2019-07-21 MED ORDER — SENNOSIDES-DOCUSATE SODIUM 8.6-50 MG PO TABS: 2.0000 | ORAL_TABLET | ORAL | Status: DC

## 2019-07-21 MED ORDER — ONDANSETRON HCL 4 MG/2ML IJ SOLN: 4.0000 mg | Freq: Four times a day (QID) | INTRAMUSCULAR | Status: DC | PRN

## 2019-07-21 MED ORDER — LACTATED RINGERS IV SOLN: 500.0000 mL | INTRAVENOUS | Status: DC | PRN

## 2019-07-21 MED ORDER — MISOPROSTOL 200 MCG PO TABS
400.0000 ug | ORAL_TABLET | ORAL | Status: DC
  Administered 2019-07-21: 400 ug via VAGINAL

## 2019-07-21 MED ORDER — MISOPROSTOL 25 MCG QUARTER TABLET
ORAL_TABLET | ORAL | Status: AC
  Administered 2019-07-21: 25 ug via VAGINAL
  Filled 2019-07-21: qty 2

## 2019-07-21 MED ORDER — PRENATAL MULTIVITAMIN CH: 1.0000 | ORAL_TABLET | Freq: Every day | ORAL | Status: DC

## 2019-07-21 MED ORDER — DIPHENHYDRAMINE HCL 25 MG PO CAPS: 25.0000 mg | ORAL_CAPSULE | Freq: Four times a day (QID) | ORAL | Status: DC | PRN

## 2019-07-21 MED ORDER — ACETAMINOPHEN 325 MG PO TABS: 650.0000 mg | ORAL_TABLET | ORAL | Status: DC | PRN

## 2019-07-21 MED ORDER — DIBUCAINE (PERIANAL) 1 % EX OINT: 1.0000 "application " | TOPICAL_OINTMENT | CUTANEOUS | Status: DC | PRN

## 2019-07-21 MED ORDER — SIMETHICONE 80 MG PO CHEW: 80.0000 mg | CHEWABLE_TABLET | ORAL | Status: DC | PRN

## 2019-07-21 MED ORDER — MISOPROSTOL 200 MCG PO TABS
ORAL_TABLET | ORAL | Status: AC
  Filled 2019-07-21: qty 2

## 2019-07-21 MED ORDER — MISOPROSTOL 25 MCG QUARTER TABLET: 25.0000 ug | ORAL_TABLET | ORAL | Status: DC

## 2019-07-21 MED ORDER — ONDANSETRON HCL 4 MG/2ML IJ SOLN: 4.0000 mg | INTRAMUSCULAR | Status: DC | PRN

## 2019-07-21 MED ORDER — AMMONIA AROMATIC IN INHA
RESPIRATORY_TRACT | Status: AC
  Filled 2019-07-21: qty 10

## 2019-07-21 MED ORDER — OXYTOCIN BOLUS FROM INFUSION
500.0000 mL | Freq: Once | INTRAVENOUS | Status: AC
  Administered 2019-07-21: 333 mL via INTRAVENOUS

## 2019-07-21 MED ORDER — BENZOCAINE-MENTHOL 20-0.5 % EX AERO: 1.0000 "application " | INHALATION_SPRAY | CUTANEOUS | Status: DC | PRN

## 2019-07-21 MED ORDER — BUTORPHANOL TARTRATE 1 MG/ML IJ SOLN
1.0000 mg | INTRAMUSCULAR | Status: DC | PRN
  Administered 2019-07-21 (×2): 1 mg via INTRAVENOUS
  Filled 2019-07-21 (×2): qty 1

## 2019-07-21 MED ORDER — FERROUS SULFATE 325 (65 FE) MG PO TABS
325.0000 mg | ORAL_TABLET | Freq: Two times a day (BID) | ORAL | Status: DC
  Administered 2019-07-22: 325 mg via ORAL
  Filled 2019-07-21: qty 1

## 2019-07-21 MED ORDER — WITCH HAZEL-GLYCERIN EX PADS: 1.0000 "application " | MEDICATED_PAD | CUTANEOUS | Status: DC | PRN

## 2019-07-21 MED ORDER — MISOPROSTOL 25 MCG QUARTER TABLET
25.0000 ug | ORAL_TABLET | Freq: Once | ORAL | Status: AC
  Administered 2019-07-21: 25 ug via BUCCAL

## 2019-07-21 MED ORDER — LIDOCAINE HCL (PF) 1 % IJ SOLN
30.0000 mL | INTRAMUSCULAR | Status: DC | PRN
  Filled 2019-07-21: qty 30

## 2019-07-21 MED ORDER — OXYTOCIN 10 UNIT/ML IJ SOLN
INTRAMUSCULAR | Status: AC
  Filled 2019-07-21: qty 2

## 2019-07-21 MED ORDER — LACTATED RINGERS IV SOLN: INTRAVENOUS | Status: DC

## 2019-07-21 MED ORDER — IBUPROFEN 600 MG PO TABS: 600.0000 mg | ORAL_TABLET | Freq: Four times a day (QID) | ORAL | Status: DC

## 2019-07-21 NOTE — H&P (Signed)
OB History & Physical   History of Present Illness:  Chief Complaint: vaginal bleeding  HPI:  Alicia Terry is a 29 y.o. G45P0010 female at [redacted]w[redacted]d dated by LMP.  Her pregnancy has been complicated by incarceration until a few weeks ago.  She had some prenatal care while she was incarcerated.  She denies contractions.   She reports leakage of fluid early this morning and thought she was peeing on herself.   She got up and saw bright red vaginal bleeding going down her leg and into the toilet. She reports clots. Yesterday she had some abdominal pain. She denies pain at this time.   She reports last fetal movement was yesterday.   Patient's Covid test today is positive. She denies any s/s of illness or exposure to anyone with Covid.    Total weight gain for pregnancy: -4.536 kg   Obstetrical Problem List: She had an ectopic pregnancy  Maternal Medical History:   Past Medical History:  Diagnosis Date  . BV (bacterial vaginosis)   . Chlamydia   . Ectopic pregnancy     Past Surgical History:  Procedure Laterality Date  . NO PAST SURGERIES      No Known Allergies  Prior to Admission medications   Medication Sig Start Date End Date Taking? Authorizing Provider  ferrous sulfate 325 (65 FE) MG tablet Take 325 mg by mouth daily as needed (for low iron (sluggishness)).   Yes [provider]  Multiple Vitamins-Calcium (ONE-A-DAY WOMENS PO) Take 1 tablet by mouth daily.   Yes [provider]  Prenatal Vit-Fe Fumarate-FA (PRENATAL MULTIVITAMIN) TABS tablet Take 1 tablet by mouth daily at 12 noon.   Yes [provider]  Boric Acid POWD by Does not apply route.    [provider]  Probiotic Product (PROBIOTIC-10 PO) Take by mouth.    [provider]    OB History  Gravida Para Term Preterm AB Living  2       1    SAB TAB Ectopic Multiple Live Births      1        # Outcome Date GA Lbr Len/2nd Weight Sex Delivery Anes PTL Lv  2 Current            1 Ectopic             Prenatal care site: [facility]  Social History: She  reports that she has never smoked. She has never used smokeless tobacco. She reports current alcohol use. She reports current drug use. Drug: Marijuana.  Family History: family history includes Diabetes in her mother; Healthy in her father; Hypertension in her mother.    Review of Systems:  Review of Systems  Constitutional: Negative for chills and fever.  HENT: Negative for congestion, ear discharge, ear pain, hearing loss, sinus pain and sore throat.   Eyes: Negative for blurred vision and double vision.  Respiratory: Negative for cough, shortness of breath and wheezing.   Cardiovascular: Negative for chest pain, palpitations and leg swelling.  Gastrointestinal: Negative for abdominal pain, blood in stool, constipation, diarrhea, heartburn, melena, nausea and vomiting.  Genitourinary: Negative for dysuria, flank pain, frequency, hematuria and urgency.       Positive for vaginal bleeding  Musculoskeletal: Negative for back pain, joint pain and myalgias.  Skin: Negative for itching and rash.  Neurological: Negative for dizziness, tingling, tremors, sensory change, speech change, focal weakness, seizures, loss of consciousness, weakness and headaches.  Endo/Heme/Allergies: Negative for environmental allergies.  Does not bruise/bleed easily.  Psychiatric/Behavioral: Negative for depression, hallucinations, memory loss, substance abuse and suicidal ideas. The patient is not nervous/anxious and does not have insomnia.      Physical Exam:  BP 117/72 (BP Location: Left Arm)   Pulse 99   Temp 98.6 F (37 C) (Oral)   LMP 02/20/2019   SpO2 100%     Pertinent Results:   Blood type/Rh O positive  Antibody screen negative  Rubella Immune  Varicella Immune    RPR Non-Reactive  HBsAg negative  HIV negative  GC negative  Chlamydia negative  Genetic screening unknown  1 hour GTT unknown  3 hour  GTT unknown  GBS Not done    Contractions: some uterine irritability and rare contraction   Bedside Ultrasound: no cardiac activity or movement  Sterile speculum exam: pooling blood, unable to see cervix Digital exam: external os is a dimple  Ultrasound confirmed no cardiac activity seen   Lab Results  Component Value Date   SARSCOV2NAA POSITIVE (A) 07/21/2019  ]  Assessment:  Alicia Terry is a 29 y.o. G37P0010 female at [redacted]w[redacted]d with fetal demise.   Plan:  1. Admit to Labor & Delivery  2. CBC, T&S, Clrs, IVF, KB, fibrinogen 3. Start induction with cytotec 4. Covid positive: precautions in place 5. Grief protocol, Chaplain if patient desires   Rod Can, [facility] 07/21/2019 7:08 AM

## 2019-07-21 NOTE — Progress Notes (Signed)
Alicia Terry is a 29 y.o. G2P0010 at [redacted]w[redacted]d by ultrasound admitted for induction of labor due to a diagnosis of fetal demise after SROM earlier this morning. .  Subjective:  Alicia Terry is appropriately tearful. Her mother is at her bedisde and supportive. They were given the opportunity to transfer to [facility] or [facility] for a D and E procedure, but prefer to stay at [facility] and continue with IOL.  She denies any ongoing bleeding or cramping at this time.   Objective: BP 119/71   Pulse 76   Temp 98.9 F (37.2 C) (Oral)   Resp 15   Ht 5\' 8"  (1.727 m)   Wt 103 kg   LMP 02/20/2019   SpO2 100%   BMI 34.53 kg/m  No intake/output data recorded. No intake/output data recorded.   UC:   none SVE:     deferred Labs: Lab Results  Component Value Date   WBC 6.0 07/21/2019   HGB 10.8 (L) 07/21/2019   HCT 30.0 (L) 07/21/2019   MCV 88.8 07/21/2019   PLT 132 (L) 07/21/2019    Assessment / Plan: Induction of labor due to fetal demise,   Ongoing cytotec use  Labor: for continuing Cytotec dosing. Pain Control:  has medication ordered as needed for pain I/D:  n/a Anticipated MOD:  NSVD  We discussed the ongoing process of Cytotec use. Will plan on redosing at 11:30 with 456mcg. Offered a hospital chaplain visit if they desire.  Imagene Riches 07/21/2019, 10:25 AM

## 2019-07-21 NOTE — OB Triage Note (Signed)
Recvd pt via EMS. Pt states she felt a big gush and thought she peed on herself. When she looked she noticed a large amount of blood around 0230 and called 911. Pt states she is in no pain. Opal Sidles called to [facility] at 0325. Bedside U/S preformed.

## 2019-07-21 NOTE — Progress Notes (Signed)
CT value of Covid test was 40.5, which reflects a noninfectious range. Discussed with Dr. Delaine Lame who confirmed. Isolation is not required, standard precautions is appropriate.

## 2019-07-21 NOTE — Discharge Summary (Signed)
Postpartum Discharge Summary  Patient Name: Alicia Terry DOB: 10/10/1990 MRN: [id number]  Date of admission: 07/21/2019 Delivery date:07/21/2019  Delivering provider: Prentice Docker D  Date of discharge: 07/22/2019  Admitting diagnosis: Vaginal bleeding in pregnancy, second trimester [O46.92] Intrauterine pregnancy: [redacted]w[redacted]d    Secondary diagnosis:  Active Problems:   Vaginal bleeding in pregnancy, second trimester   Fetal demise affecting delivery     Additional problems: none    Discharge diagnosis: Preterm Pregnancy Delivered and intrauterine fetal demise, second trimester , postpartum anemia                                             Post partum procedures:none Augmentation: Cytotec [facility] Induction of Labor With Vaginal Delivery   29y.o. yo G2P0110 at 242w4das admitted to the hospital 07/21/2019 for induction of labor.  Indication for induction: stillborn at 2173 weeks Patient had an uncomplicated labor course as follows: Membrane Rupture Time/Date: 2:30 AM ,07/21/2019   Delivery Method:Vaginal, Spontaneous  Episiotomy: None  Lacerations:  None  Details of delivery can be found in separate delivery note.  Patient had a routine postpartum course. Patient is discharged home 07/22/19.  Newborn Data: Birth date:07/21/2019  Birth time:3:17 PM  Gender:Female  Living status:Fetal Demise  Apgars: ,  Weight: 280 gm  Magnesium Sulfate received: No BMZ received: No Rhophylac:No MMR:No T-DaP:not given Flu: No Transfusion:No  Physical exam  Vitals:   07/21/19 2230 07/22/19 0545 07/22/19 0715 07/22/19 0717  BP: (!) 113/57 105/68  125/70  Pulse: 83 84  90  Resp:      Temp:   99.2 F (37.3 C)   TempSrc:   Oral   SpO2:      Weight:      Height:       General: alert, cooperative and no distress; wanting discharge Lochia: appropriate Uterine Fundus: firm/ U-2/ML/NT Incision: N/A DVT Evaluation: No evidence of DVT seen on  physical exam. Labs: Lab Results  Component Value Date   WBC 7.6 07/22/2019   HGB 9.8 (L) 07/22/2019   HCT 30.1 (L) 07/22/2019   MCV 84.3 07/22/2019   PLT 140 (L) 07/22/2019   CMP Latest Ref Rng & Units 08/28/2018  Glucose 65 - 99 mg/dL 90  BUN 6 - 20 mg/dL 9  Creatinine 0.57 - 1.00 mg/dL 0.88  Sodium 134 - 144 mmol/L 139  Potassium 3.5 - 5.2 mmol/L 4.0  Chloride 96 - 106 mmol/L 103  CO2 20 - 29 mmol/L 20  Calcium 8.7 - 10.2 mg/dL 9.5  Total Protein 6.0 - 8.5 g/dL 6.3  Total Bilirubin 0.0 - 1.2 mg/dL <0.2  Alkaline Phos 39 - 117 IU/L 32(L)  AST 0 - 40 IU/L 15  ALT 0 - 32 IU/L 14   Edinburgh Score: Edinburgh Postnatal Depression Scale Screening Tool 07/22/2019  I have been able to laugh and see the funny side of things. 0  I have looked forward with enjoyment to things. 0  I have blamed myself unnecessarily when things went wrong. 0  I have been anxious or worried for no good reason. 0  I have felt scared or panicky for no good reason. 0  Things have been getting on top of me. 1  I have been so unhappy that I have had difficulty sleeping. 0  I have felt  sad or miserable. 0  I have been so unhappy that I have been crying. 0  The thought of harming myself has occurred to me. 0  Edinburgh Postnatal Depression Scale Total 1      After visit meds:  Allergies as of 07/22/2019   No Known Allergies     Medication List    STOP taking these medications   Boric Acid Powd   ONE-A-DAY WOMENS PO     TAKE these medications   acetaminophen 325 MG tablet Commonly known as: TYLENOL Take 2 tablets (650 mg total) by mouth every 4 (four) hours as needed for mild pain, fever or headache (for pain scale < 4ORtemperature>/=100.5 F).   ferrous sulfate 325 (65 FE) MG tablet Take 1 tablet (325 mg total) by mouth daily. What changed:   when to take this  reasons to take this   ibuprofen 600 MG tablet Commonly known as: ADVIL Take 1 tablet (600 mg total) by mouth every 6  (six) hours as needed for moderate pain or cramping.   prenatal multivitamin Tabs tablet Take 1 tablet by mouth daily at 12 noon.   PROBIOTIC-10 PO Take by mouth.        Discharge home in stable condition Infant [place] Discharge instruction: per After Visit Summary and Postpartum booklet. Activity: Advance as tolerated. Pelvic rest for 6 weeks.  Diet: routine diet Anticipated Birth Control: Unsure, desires another pregnancy in the near future Postpartum Appointment:2 weeks Additional Postpartum F/U: Postpartum Depression checkup Future Appointments: 07 August 2019 1100 Follow up Visit:  Follow-up Information    Will Bonnet, MD. Go on 08/07/2019.   Specialty: Obstetrics and Gynecology Why: postpartum check at [address] information: [address] [place] [address] [phone]                   07/22/2019 Dalia Heading, CNM

## 2019-07-22 DIAGNOSIS — O9081 Anemia of the puerperium: Secondary | ICD-10-CM | POA: Diagnosis present

## 2019-07-22 LAB — CBC
HCT: 30.1 % — ABNORMAL LOW (ref 36.0–46.0)
Hemoglobin: 9.8 g/dL — ABNORMAL LOW (ref 12.0–15.0)
MCH: 27.5 pg (ref 26.0–34.0)
MCHC: 32.6 g/dL (ref 30.0–36.0)
MCV: 84.3 fL (ref 80.0–100.0)
Platelets: 140 10*3/uL — ABNORMAL LOW (ref 150–400)
RBC: 3.57 MIL/uL — ABNORMAL LOW (ref 3.87–5.11)
RDW: 13 % (ref 11.5–15.5)
WBC: 7.6 10*3/uL (ref 4.0–10.5)
nRBC: 0 % (ref 0.0–0.2)

## 2019-07-22 MED ORDER — FERROUS SULFATE 325 (65 FE) MG PO TABS: 325.0000 mg | ORAL_TABLET | Freq: Every day | ORAL | 3 refills | Status: DC

## 2019-07-22 MED ORDER — ACETAMINOPHEN 325 MG PO TABS: 650.0000 mg | ORAL_TABLET | ORAL | Status: DC | PRN

## 2019-07-22 MED ORDER — IBUPROFEN 600 MG PO TABS: 600.0000 mg | ORAL_TABLET | Freq: Four times a day (QID) | ORAL | 0 refills | Status: DC | PRN

## 2019-07-22 NOTE — Progress Notes (Signed)
Pt given discharge instructions, pt verbalized understanding. Pt taken via wheelchair with family to medical mall.

## 2019-07-22 NOTE — Discharge Instructions (Signed)
Managing Pregnancy Loss Pregnancy loss can happen any time during a pregnancy. Often the cause is not known. It is rarely because of anything you did. Pregnancy loss in early pregnancy (during the first trimester) is called a miscarriage. This type of pregnancy loss is the most common. Pregnancy loss that happens after 20 weeks of pregnancy is called fetal demise if the baby's heart stops beating before birth. Fetal demise is much less common. Some women experience spontaneous labor shortly after fetal demise resulting in a stillborn birth (stillbirth). Any pregnancy loss can be devastating. You will need to recover both physically and emotionally. Most women are able to get pregnant again after a pregnancy loss and deliver a healthy baby. How to manage emotional recovery  Pregnancy loss is very hard emotionally. You may feel many different emotions while you grieve. You may feel sad and angry. You may also feel guilty. It is normal to have periods of crying. Emotional recovery can take longer than physical recovery. It is different for everyone. Taking these steps can help you in managing this loss:  Remember that it is unlikely you did anything to cause the pregnancy loss.  Share your thoughts and feelings with friends, family, and your partner. Remember that your partner is also recovering emotionally.  Make sure you have a good support system. Do not spend too much time alone.  Meet with a pregnancy loss counselor or join a pregnancy loss support group.  Get enough sleep and eat a healthy diet. Return to regular exercise when you have recovered physically.  Do not use drugs or alcohol to manage your emotions.  Consider seeing a mental health professional to help you recover emotionally.  Ask a friend or loved one to help you decide what to do with any clothing and nursery items you received for your baby. In the case of a stillbirth, many women benefit from taking additional steps in the  grieving process. You may want to:  Hold your baby after the birth.  Name your baby.  Request a birth certificate.  Create a keepsake such as handprints or footprints.  Dress your baby and have a picture taken.  Make funeral arrangements.  Ask for a baptism or blessing. Hospitals have staff members who can help you with all these arrangements. How to recognize emotional stress It is normal to have emotional stress after a pregnancy loss. But emotional stress that lasts a long time or becomes severe requires treatment. Watch out for these signs of severe emotional stress:  Sadness, anger, or guilt that is not going away and is interfering with your normal activities.  Relationship problems that have occurred or gotten worse since the pregnancy loss.  Signs of depression that last longer than 2 weeks. These may include: ? Sadness. ? Anxiety. ? Hopelessness. ? Loss of interest in activities you enjoy. ? Inability to concentrate. ? Trouble sleeping or sleeping too much. ? Loss of appetite or overeating. ? Thoughts of death or of hurting yourself. Follow these instructions at home:  Take over-the-counter and prescription medicines only as told by your health care provider.  Rest at home until your energy level returns. Return to your normal activities as told by your health care provider. Ask your health care provider what activities are safe for you.  When you are ready, meet with your health care provider to discuss steps to take for a future pregnancy.  Keep all follow-up visits as told by your health care provider. This is important.  Where to find support  To help you and your partner with the process of grieving, talk with your health care provider or seek counseling.  Consider meeting with others who have experienced pregnancy loss. Ask your health care provider about support groups and resources. Where to find more information  U.S. Department of Health and Holiday representative on Women's Health: [place]  American Pregnancy Association: www.americanpregnancy.org Contact a health care provider if:  You continue to experience grief, sadness, or lack of motivation for everyday activities, and those feelings do not improve over time.  You are struggling to recover emotionally, especially if you are using alcohol or substances to help. Get help right away if:  You have thoughts of hurting yourself or others. If you ever feel like you may hurt yourself or others, or have thoughts about taking your own life, get help right away. You can go to your nearest emergency department or call:  Your local emergency services (911 in the [place]  A suicide crisis helpline, such as the [place] at (361) 732-1208. This is open 24 hours a day. Summary  Any pregnancy loss can be difficult physically and emotionally.  You may experience many different emotions while you grieve. Emotional recovery can last longer than physical recovery.  It is normal to have emotional stress after a pregnancy loss. But emotional stress that lasts a long time or becomes severe requires treatment.  See your health care provider if you are struggling emotionally after a pregnancy loss. This information is not intended to replace advice given to you by your health care provider. Make sure you discuss any questions you have with your health care provider. Document Revised: 05/14/2018 Document Reviewed: 04/04/2017 Elsevier Patient Education  [place].   Vaginal Delivery, Care After Refer to this sheet in the next few weeks. These discharge instructions provide you with information on caring for yourself after delivery. Your caregiver may also give you specific instructions. Your treatment has been planned according to the most current medical practices available, but problems sometimes occur. Call your caregiver if you have any problems or  questions after you go home. HOME CARE INSTRUCTIONS 1. Take over-the-counter or prescription medicines only as directed by your caregiver or pharmaci 2. Continue to use good perineal care. Good perineal care includes: 1. Wiping your perineum from back to front 2. Keeping your perineum clean. 3. You can do sitz baths twice a day, to help keep this area clean 3. Do not use tampons, douche or have sex for 6 weeks 4. Shower only and avoid sitting in submerged water, aside from sitz baths 5. Wear a well-fitting bra that provides breast support. 6. Eat healthy foods. 7. Drink enough fluids to keep your urine clear or pale yellow. 8. Eat high-fiber foods such as whole grain cereals and breads, brown rice, beans, and fresh fruits and vegetables every day. These foods may help prevent or relieve constipation. 9. Avoid constipation with high fiber foods or medications, such as miralax or metamucil 10. Follow your caregiver's recommendations regarding resumption of activities such as climbing stairs, driving, lifting, exercising, or traveling. 11. Talk to your caregiver about resuming sexual activities. Resumption of sexual activities after 6 weeks is dependent upon your risk of infection, your rate of healing, and your comfort and desire to resume sexual activity. 12. Rest as much as possible.  13. Increase your activities gradually. 14. Keep all of your scheduled postpartum appointments. It is very important to keep your  scheduled follow-up appointments. At these appointments, your caregiver will be checking to make sure that you are healing physically and emotionally. SEEK MEDICAL CARE IF:   You are passing large clots from your vagina. Save any clots to show your caregiver.  You have a foul smelling discharge from your vagina.  You have trouble urinating.  You are urinating frequently.  You have pain when you urinate.  You have a change in your bowel movement  You have painful, hard, or  reddened breasts.  You have a severe headache.  You have blurred vision or see spots.  You feel sad or depressed.  You have thoughts of hurting yourself   You have questions about your care,  or medicines.  You are dizzy or light-headed.  You have a rash.  You have nausea or vomiting  You have not had a menstrual period by the 12th week after delivery.  You have a fever of 100.5 or more SEEK IMMEDIATE MEDICAL CARE IF:   You have persistent pain.  You have chest pain.  You have shortness of breath.  You faint.  You have leg pain.  You have stomach pain.  Your vaginal bleeding saturates three or more sanitary pads in 1 hour. MAKE SURE YOU:   Understand these instructions.  Will watch your condition.  Will get help right away if you are not doing well or get worse. Document Released: 01/20/2000 Document Revised: 06/08/2013 Document Reviewed: 09/19/2011 [facility] Patient Information 2015 [place], [place]. This information is not intended to replace advice given to you by your health care provider. Make sure you discuss any questions you have with your health care provider.  Sitz Bath A sitz bath is a warm water bath taken in the sitting position. The water covers only the hips and butt (buttocks). We recommend using one that fits in the toilet, to help with ease of use and cleanliness. It may be used for either healing or cleaning purposes. Sitz baths are also used to relieve pain, itching, or muscle tightening (spasms). The water may contain medicine. Moist heat will help you heal and relax.  HOME CARE  Take 3 to 4 sitz baths a day. 15. Fill the bathtub half-full with warm water. 16. Sit in the water and open the drain a little. 17. Turn on the warm water to keep the tub half-full. Keep the water running constantly. 18. Soak in the water for 15 to 20 minutes. 19. After the sitz bath, pat the affected area dry. GET HELP RIGHT AWAY IF: You get worse instead of better.  Stop the sitz baths if you get worse. MAKE SURE YOU:  Understand these instructions.  Will watch your condition.  Will get help right away if you are not doing well or get worse. Document Released: 03/01/2004 Document Revised: 10/17/2011 Document Reviewed: 05/22/2010 [facility] Patient Information 2015 [place], [place]. This information is not intended to replace advice given to you by your health care provider. Make sure you discuss any questions you have with your health care provider.

## 2019-07-29 LAB — SURGICAL PATHOLOGY

## 2019-08-07 ENCOUNTER — Encounter: Payer: Self-pay | Admitting: Obstetrics and Gynecology

## 2019-08-07 ENCOUNTER — Ambulatory Visit (INDEPENDENT_AMBULATORY_CARE_PROVIDER_SITE_OTHER): Payer: Medicaid Other | Admitting: Obstetrics and Gynecology

## 2019-08-07 ENCOUNTER — Other Ambulatory Visit: Payer: Self-pay

## 2019-08-07 VITALS — BP 122/74 | Ht 68.0 in

## 2019-08-07 DIAGNOSIS — O364XX0 Maternal care for intrauterine death, not applicable or unspecified: Secondary | ICD-10-CM

## 2019-08-07 NOTE — Progress Notes (Signed)
Obstetrics & Gynecology Office Visit   Chief Complaint  Patient presents with  . Follow-up  . Postpartum Care    History of Present Illness: 29 y.o. G18P0110 female who is 2 weeks postpartum from an SVD after fetal demise.  She presents today for follow up and mood check. She states that she has been coping well from a mood standpoint. She denies any current vaginal bleeding. She has no pain or other issues. EPDS today: 3.  Reviewed pathology report.  Will refer to MFM for further discussion regarding workup, possibly to rheumatology, if indicated.    Past Medical History:  Diagnosis Date  . BV (bacterial vaginosis)   . Chlamydia   . Ectopic pregnancy     Past Surgical History:  Procedure Laterality Date  . NO PAST SURGERIES      Gynecologic History: Patient's last menstrual period was 02/20/2019.  Obstetric History: G2P0110  Family History  Problem Relation Age of Onset  . Diabetes Mother   . Hypertension Mother   . Healthy Father     Social History   Socioeconomic History  . Marital status: Single    Spouse name: Not on file  . Number of children: Not on file  . Years of education: Not on file  . Highest education level: Not on file  Occupational History  . Not on file  Tobacco Use  . Smoking status: Never Smoker  . Smokeless tobacco: Never Used  Vaping Use  . Vaping Use: Never used  Substance and Sexual Activity  . Alcohol use: Yes  . Drug use: Yes    Types: Marijuana  . Sexual activity: Yes    Birth control/protection: None  Other Topics Concern  . Not on file  Social History Narrative  . Not on file   Social Determinants of Health   Financial Resource Strain:   . Difficulty of Paying Living Expenses:   Food Insecurity:   . Worried About Charity fundraiser in the Last Year:   . Arboriculturist in the Last Year:   Transportation Needs:   . Film/video editor (Medical):   Marland Kitchen Lack of Transportation (Non-Medical):   Physical Activity:     . Days of Exercise per Week:   . Minutes of Exercise per Session:   Stress:   . Feeling of Stress :   Social Connections:   . Frequency of Communication with Friends and Family:   . Frequency of Social Gatherings with Friends and Family:   . Attends Religious Services:   . Active Member of Clubs or Organizations:   . Attends Archivist Meetings:   Marland Kitchen Marital Status:   Intimate Partner Violence:   . Fear of Current or Ex-Partner:   . Emotionally Abused:   Marland Kitchen Physically Abused:   . Sexually Abused:     No Known Allergies  Prior to Admission medications   Medication Sig Start Date End Date Taking? Authorizing Provider  acetaminophen (TYLENOL) 325 MG tablet Take 2 tablets (650 mg total) by mouth every 4 (four) hours as needed for mild pain, fever or headache (for pain scale < 4ORtemperature>/=100.5 F). 07/22/19  Yes Dalia Heading, CNM  ferrous sulfate 325 (65 FE) MG tablet Take 1 tablet (325 mg total) by mouth daily. 07/22/19  Yes Dalia Heading, CNM  ibuprofen (ADVIL) 600 MG tablet Take 1 tablet (600 mg total) by mouth every 6 (six) hours as needed for moderate pain or cramping. 07/22/19  Yes Dalia Heading, CNM  Prenatal Vit-Fe Fumarate-FA (PRENATAL MULTIVITAMIN) TABS tablet Take 1 tablet by mouth daily at 12 noon.   Yes [provider]  Probiotic Product (PROBIOTIC-10 PO) Take by mouth.   Yes [provider]    Review of Systems  Constitutional: Negative.   HENT: Negative.   Eyes: Negative.   Respiratory: Negative.   Cardiovascular: Negative.   Gastrointestinal: Negative.   Genitourinary: Negative.   Musculoskeletal: Negative.   Skin: Negative.   Neurological: Negative.   Psychiatric/Behavioral: Negative.      Physical Exam BP 122/74   Ht 5\' 8"  (1.727 m)   LMP 02/20/2019   BMI 34.53 kg/m  Patient's last menstrual period was 02/20/2019. Physical Exam Constitutional:      General: She is not in acute distress.     Appearance: Normal appearance.  HENT:     Head: Normocephalic and atraumatic.  Eyes:     General: No scleral icterus.    Conjunctiva/sclera: Conjunctivae normal.  Neurological:     General: No focal deficit present.     Mental Status: She is alert and oriented to person, place, and time.     Cranial Nerves: No cranial nerve deficit.  Psychiatric:        Mood and Affect: Mood normal.        Behavior: Behavior normal.        Judgment: Judgment normal.     Female chaperone present for pelvic and breast  portions of the physical exam  Assessment: 29 y.o. G59P0110 female here for  1. Fetal demise affecting delivery   2. Postpartum care following vaginal delivery      Plan: Problem List Items Addressed This Visit      Other   Fetal demise affecting delivery - Primary   Relevant Orders   AMB referral to maternal fetal medicine   Postpartum care following vaginal delivery   Relevant Orders   AMB referral to maternal fetal medicine     Refer to MFM. I am asking that they review the placental pathology and her pregnancy and perform any workup indicated and counsel patient about future pregnancies.   Follow up in 4 weeks for 6 weeks check and contraception discussion.   Prentice Docker, MD 08/07/2019 1:10 PM

## 2019-08-24 ENCOUNTER — Ambulatory Visit: Payer: Medicaid Other

## 2019-08-25 ENCOUNTER — Ambulatory Visit: Payer: Medicaid Other | Attending: Obstetrics | Admitting: Maternal & Fetal Medicine

## 2019-08-25 ENCOUNTER — Other Ambulatory Visit: Payer: Self-pay

## 2019-08-25 DIAGNOSIS — Z3169 Encounter for other general counseling and advice on procreation: Secondary | ICD-10-CM

## 2019-08-25 DIAGNOSIS — Z8759 Personal history of other complications of pregnancy, childbirth and the puerperium: Secondary | ICD-10-CM

## 2019-08-25 DIAGNOSIS — O364XX0 Maternal care for intrauterine death, not applicable or unspecified: Secondary | ICD-10-CM

## 2019-08-25 NOTE — Progress Notes (Signed)
MFM Consult Note  Alicia Terry is a gravida 2 para 0 who was seen in consultation due to a recent pregnancy loss at 21+ weeks.  The patient reports that on the day prior to her loss, she felt lower abdominal cramping/contractions throughout the day.  When she went to bed that night, she felt leakage of fluid.  She was then brought to the hospital where a rupture of membrane check was positive.  She then underwent a Cytotec induction and had an uncomplicated vaginal delivery of a nonviable, demised fetus.  The patient reports that a low fetal heart rate was noted at the start of the induction process.  The placental pathology report from her 21+ week delivery showed an appropriate sized placenta (50th percentile for her gestational age).  There was a normal three-vessel umbilical cord noted that was negative for funisitis and thrombus.  There was also extensive degenerative changes and extensive perivillous fibrin deposition.  It is uncertain if the degenerative changes occurred following the demise and induction process or if these changes contributed to her loss.  As part of the work-up for her pregnancy loss, she had a Kleihauer-Betke test that was negative, her drug screen was negative, her blood type is O+, her hemoglobin electrophoresis is within normal limits, and she had a false positive COVID-19 test.    The patient reports that she was incarcerated for 3 months during her last pregnancy.  She reports that she did receive prenatal care while incarcerated with the physicians from [facility].  OB history includes 1 ectopic pregnancy and the previable delivery at 21+ weeks.  Surgical history includes with system teeth removal and excision of a cyst on right side of her chest.  The patient reports that she is currently smoking marijuana.  She denies any marijuana use during her last pregnancy.  She also has a history of bacterial vaginosis.  She reports that she was not diagnosed with  bacterial vaginosis during her last pregnancy.  The patient was advised that the loss that she suffered at 21+ weeks during her last pregnancy was most likely the result of PPROM following preterm labor/contractions.  As a fetal heartbeat was present when the induction process was started, the fetal demise most likely occurred close to the time of delivery or afterwards. She was advised that the cause of the preterm contractions that she experienced and PPROM remains undetermined.  The patient was advised that the main thing that we should focus on now is what we can do to help her achieve a successful pregnancy outcome for her future pregnancies.  Due to a previable delivery/PPROM, I would recommend that she be started on weekly injections of 17 alpha hydroxyprogesterone caporate (17-P) starting at 16 weeks of her future pregnancy and continued until 36 weeks.  She was advised that I have recommended weekly 17 P injections to many women in the past with a similar history.  Most of the women had successful pregnancy outcomes in their subsequent pregnancies.  She should have a first trimester ultrasound performed once she is pregnant.  We will follow her with serial transvaginal (every 2 to 3 weeks) cervical length measurements starting at around 16 weeks.  A detailed fetal anatomy scan will be performed at around 19 to 20 weeks.  She should then be followed with serial growth ultrasounds every 4 to 5 weeks.  As she does not have a prior thromboembolic event, does not have a history of multiple prior pregnancy losses, and there  was no thrombosis noted on her placental pathology report, I do not believe that she needs to be worked up for the antiphospholipid antibody syndrome or for inherited thrombophilias.  She should be worked up and treated for bacterial vaginosis should she complain of any symptoms in her future pregnancy, as bacterial vaginosis has been associated with an increased risk of preterm  birth.  She was advised to continue taking a prenatal vitamin with folic acid during the preconception period. The patient was reassured that I anticipate that she will most likely have a successful pregnancy outcome in the future.  At the end of the consultation, the patient stated that all her questions had been answered to her complete satisfaction.    Thank you for referring this patient for a Maternal-Fetal Medicine preconception consultation.  We look forward to managing her future pregnancy with you.  A total of 50 minutes was spent counseling and coordinating the care for this patient.  Recommendations: Start weekly 17 P injections at 16 weeks of her future pregnancy and continued until 36 weeks  Serial cervical length measurements starting at 16 weeks Detailed fetal anatomy scan at 19 weeks Serial growth ultrasounds every 4 to 5 weeks   This note was copied from an original document that was generated in [employer].

## 2019-08-28 ENCOUNTER — Ambulatory Visit: Payer: Medicaid Other | Admitting: Obstetrics and Gynecology

## 2019-10-07 ENCOUNTER — Other Ambulatory Visit: Payer: Self-pay

## 2019-10-07 ENCOUNTER — Other Ambulatory Visit (HOSPITAL_COMMUNITY)
Admission: RE | Admit: 2019-10-07 | Discharge: 2019-10-07 | Disposition: A | Discharge status: Home / Self Care | Payer: Medicaid Other | Source: Ambulatory Visit | Attending: Obstetrics and Gynecology | Admitting: Obstetrics and Gynecology

## 2019-10-07 ENCOUNTER — Encounter: Payer: Self-pay | Admitting: Obstetrics and Gynecology

## 2019-10-07 ENCOUNTER — Ambulatory Visit: Payer: Medicaid Other | Admitting: Obstetrics and Gynecology

## 2019-10-07 VITALS — BP 100/80 | Ht 68.0 in | Wt 241.0 lb

## 2019-10-07 DIAGNOSIS — B373 Candidiasis of vulva and vagina: Secondary | ICD-10-CM | POA: Diagnosis not present

## 2019-10-07 DIAGNOSIS — Z113 Encounter for screening for infections with a predominantly sexual mode of transmission: Secondary | ICD-10-CM | POA: Insufficient documentation

## 2019-10-07 DIAGNOSIS — B3731 Acute candidiasis of vulva and vagina: Secondary | ICD-10-CM

## 2019-10-07 LAB — POCT WET PREP WITH KOH
Clue Cells Wet Prep HPF POC: NEGATIVE
KOH Prep POC: NEGATIVE
Trichomonas, UA: NEGATIVE
Yeast Wet Prep HPF POC: POSITIVE

## 2019-10-07 MED ORDER — FLUCONAZOLE 150 MG PO TABS: 150.0000 mg | ORAL_TABLET | Freq: Once | ORAL | 0 refills | Status: DC

## 2019-10-07 NOTE — Patient Instructions (Signed)
I value your feedback and entrusting us with your care. If you get a  patient survey, I would appreciate you taking the time to let us know about your experience today. Thank you!  As of January 15, 2019, your lab results will be released to your MyChart immediately, before I even have a chance to see them. Please give me time to review them and contact you if there are any abnormalities. Thank you for your patience.  

## 2019-10-07 NOTE — Progress Notes (Signed)
Patient, No Pcp Per   Chief Complaint  Patient presents with   STD testing    HPI:      [name] is a 29 y.o. [id number] whose LMP was Patient's last menstrual period was 09/19/2019 (exact date)., presents today for increased vag d/c for 3 days, no itching/fishy odor. Hx of BV in past. No meds to treat, no recent abx use. Also wants STD testing. No known exposures, no new partners. Neg STD testing 6/21.   Past Medical History:  Diagnosis Date   Anemia    BV (bacterial vaginosis)    Chlamydia    Ectopic pregnancy     Past Surgical History:  Procedure Laterality Date   CYST EXCISION     Chest   NO PAST SURGERIES     WISDOM TOOTH EXTRACTION      Family History  Problem Relation Age of Onset   Diabetes Mother    Hypertension Mother    Healthy Father     Social History   Socioeconomic History   Marital status: Single    Spouse name: Not on file   Number of children: Not on file   Years of education: Not on file   Highest education level: Not on file  Occupational History   Occupation: Beautician  Tobacco Use   Smoking status: Never Smoker   Smokeless tobacco: Never Used  Scientific laboratory technician Use: Never used  Substance and Sexual Activity   Alcohol use: Not Currently   Drug use: Yes    Types: Marijuana   Sexual activity: Yes    Birth control/protection: None  Other Topics Concern   Not on file  Social History Narrative   Not on file   Social Determinants of Health   Financial Resource Strain:    Difficulty of Paying Living Expenses: Not on file  Food Insecurity:    Worried About Charity fundraiser in the Last Year: Not on file   [employer] of Food in the Last Year: Not on file  Transportation Needs:    Lack of Transportation (Medical): Not on file   Lack of Transportation (Non-Medical): Not on file  Physical Activity:    Days of Exercise per Week: Not on file   Minutes of Exercise per Session: Not on file    Stress:    Feeling of Stress : Not on file  Social Connections:    Frequency of Communication with Friends and Family: Not on file   Frequency of Social Gatherings with Friends and Family: Not on file   Attends Religious Services: Not on file   Active Member of Clubs or Organizations: Not on file   Attends Archivist Meetings: Not on file   Marital Status: Not on file  Intimate Partner Violence:    Fear of Current or Ex-Partner: Not on file   Emotionally Abused: Not on file   Physically Abused: Not on file   Sexually Abused: Not on file    Outpatient Medications Prior to Visit  Medication Sig Dispense Refill   ferrous sulfate 325 (65 FE) MG tablet Take 1 tablet (325 mg total) by mouth daily.  3   Prenatal Vit-Fe Fumarate-FA (PRENATAL MULTIVITAMIN) TABS tablet Take 1 tablet by mouth daily at 12 noon. (Patient not taking: Reported on 10/07/2019)     No facility-administered medications prior to visit.      ROS:  Review of Systems  Constitutional: Negative for fever.  Gastrointestinal: Negative for  blood in stool, constipation, diarrhea, nausea and vomiting.  Genitourinary: Positive for vaginal discharge. Negative for dyspareunia, dysuria, flank pain, frequency, hematuria, urgency, vaginal bleeding and vaginal pain.  Musculoskeletal: Negative for back pain.  Skin: Negative for rash.   BREAST: No symptoms   OBJECTIVE:   Vitals:  BP 100/80    Ht 5\' 8"  (1.727 m)    Wt 241 lb (109.3 kg)    LMP 09/19/2019 (Exact Date)    Breastfeeding No    BMI 36.64 kg/m   Physical Exam Vitals reviewed.  Constitutional:      Appearance: She is well-developed.  Pulmonary:     Effort: Pulmonary effort is normal.  Genitourinary:    General: Normal vulva.     Pubic Area: No rash.      Labia:        Right: No rash, tenderness or lesion.        Left: No rash, tenderness or lesion.      Vagina: Vaginal discharge present. No erythema or tenderness.     Cervix: Normal.      Uterus: Normal. Not enlarged and not tender.      Adnexa: Right adnexa normal and left adnexa normal.       Right: No mass or tenderness.         Left: No mass or tenderness.    Musculoskeletal:        General: Normal range of motion.     Cervical back: Normal range of motion.  Skin:    General: Skin is warm and dry.  Neurological:     General: No focal deficit present.     Mental Status: She is alert and oriented to person, place, and time.  Psychiatric:        Mood and Affect: Mood normal.        Behavior: Behavior normal.        Thought Content: Thought content normal.        Judgment: Judgment normal.     Results: Results for orders placed or performed in visit on 10/07/19 (from the past 24 hour(s))  POCT Wet Prep with KOH     Status: Normal   Collection Time: 10/07/19 11:09 AM  Result Value Ref Range   Trichomonas, UA Negative    Clue Cells Wet Prep HPF POC neg    Epithelial Wet Prep HPF POC     Yeast Wet Prep HPF POC POS    Bacteria Wet Prep HPF POC     RBC Wet Prep HPF POC     WBC Wet Prep HPF POC     KOH Prep POC Negative Negative     Assessment/Plan: Candidal vaginitis - Plan: fluconazole (DIFLUCAN) 150 MG tablet, POCT Wet Prep with KOH; Pos sx and wet prep. Rx diflucan. F/u prn.  Screening for STD (sexually transmitted disease) - Plan: Cervicovaginal ancillary only    Meds ordered this encounter  Medications   fluconazole (DIFLUCAN) 150 MG tablet    Sig: Take 1 tablet (150 mg total) by mouth once for 1 dose.    Dispense:  1 tablet    Refill:  0    Order Specific Question:   Supervising Provider    Answer:   Gae Dry [[id number]]      Return if symptoms worsen or fail to improve.  Takoya Jonas B. Ananias Kolander, PA-C 10/07/2019 11:09 AM

## 2019-10-08 LAB — CERVICOVAGINAL ANCILLARY ONLY
Chlamydia: NEGATIVE
Comment: NEGATIVE
Comment: NORMAL
Neisseria Gonorrhea: NEGATIVE

## 2019-10-30 ENCOUNTER — Ambulatory Visit
Admission: EM | Admit: 2019-10-30 | Discharge: 2019-10-30 | Disposition: A | Discharge status: Home / Self Care | Payer: Medicaid Other | Attending: Emergency Medicine | Admitting: Emergency Medicine

## 2019-10-30 DIAGNOSIS — R030 Elevated blood-pressure reading, without diagnosis of hypertension: Secondary | ICD-10-CM

## 2019-10-30 DIAGNOSIS — H00014 Hordeolum externum left upper eyelid: Secondary | ICD-10-CM | POA: Diagnosis not present

## 2019-10-30 MED ORDER — POLYMYXIN B-TRIMETHOPRIM 10000-0.1 UNIT/ML-% OP SOLN
1.0000 [drp] | Freq: Four times a day (QID) | OPHTHALMIC | 0 refills | Status: AC
Start: 2019-10-30 — End: 2019-11-06

## 2019-10-30 NOTE — Discharge Instructions (Addendum)
Use the antibiotic eyedrops as prescribed.    Follow-up with your eye doctor for a recheck in 1 to 2 days if your symptoms are not improving.    Go to the emergency department if you have acute eye pain, changes in your vision, or other concerning symptoms.     Your blood pressure is elevated today at 138/97.  Please have this rechecked by your primary care provider in 2-4 weeks.

## 2019-10-30 NOTE — ED Provider Notes (Signed)
Alicia Terry    CSN: [id number] Arrival date & time: 10/30/19  1547      History   Chief Complaint Chief Complaint  Patient presents with  . Eyelid Problem    HPI Alicia Terry is a 29 y.o. female.   Patient presents with swelling and discomfort of her left upper eyelid since waking up this morning.  She denies acute eye pain or changes in her vision.  No known injury.  She denies eye drainage, itching, or redness.  She denies fever, chills, sore throat, cough, shortness of breath, vomiting, diarrhea, or other symptoms.  No treatments attempted at home.  The history is provided by the patient.    Past Medical History:  Diagnosis Date  . Anemia   . BV (bacterial vaginosis)   . Chlamydia   . Ectopic pregnancy     Patient Active Problem List   Diagnosis Date Noted  . Vaginal delivery 07/22/2019  . Postpartum care following vaginal delivery 07/22/2019  . Postpartum anemia 07/22/2019  . Vaginal bleeding in pregnancy, second trimester 07/21/2019  . Fetal demise affecting delivery 07/21/2019  . Pregnant 07/15/2019  . Supervision of low-risk pregnancy, second trimester 07/09/2019  . History of ectopic pregnancy 03/25/2019  . Pregnancy of unknown anatomic location 08/19/2018  . Abdominal pain, lower 08/07/2018    Past Surgical History:  Procedure Laterality Date  . CYST EXCISION     Chest  . NO PAST SURGERIES    . WISDOM TOOTH EXTRACTION      OB History    Gravida  2   Para  1   Term      Preterm  1   AB  1   Living  0     SAB      TAB      Ectopic  1   Multiple  0   Live Births               Home Medications    Prior to Admission medications   Medication Sig Start Date End Date Taking? Authorizing Provider  ferrous sulfate 325 (65 FE) MG tablet Take 1 tablet (325 mg total) by mouth daily. 07/22/19  Yes Dalia Heading, CNM  Prenatal Vit-Fe Fumarate-FA (PRENATAL MULTIVITAMIN) TABS tablet Take 1 tablet by mouth daily at 12  noon. Patient not taking: Reported on 10/07/2019    [provider]  trimethoprim-polymyxin b (POLYTRIM) ophthalmic solution Place 1 drop into both eyes 4 (four) times daily for 7 days. 10/30/19 11/06/19  Sharion Balloon, NP    Family History Family History  Problem Relation Age of Onset  . Diabetes Mother   . Hypertension Mother   . Healthy Father     Social History Social History   Tobacco Use  . Smoking status: Never Smoker  . Smokeless tobacco: Never Used  Vaping Use  . Vaping Use: Never used  Substance Use Topics  . Alcohol use: Not Currently  . Drug use: Yes    Types: Marijuana     Allergies   Patient has no known allergies.   Review of Systems Review of Systems  Constitutional: Negative for chills and fever.  HENT: Negative for ear pain and sore throat.   Eyes: Negative for pain, discharge, redness, itching and visual disturbance.  Respiratory: Negative for cough and shortness of breath.   Cardiovascular: Negative for chest pain and palpitations.  Gastrointestinal: Negative for abdominal pain and vomiting.  Genitourinary: Negative for dysuria and hematuria.  Musculoskeletal:  Negative for arthralgias and back pain.  Skin: Negative for color change and rash.  Neurological: Negative for seizures and syncope.  All other systems reviewed and are negative.    Physical Exam Triage Vital Signs ED Triage Vitals [10/30/19 1615]  Enc Vitals Group     BP      Pulse      Resp      Temp      Temp src      SpO2      Weight      Height      Head Circumference      Peak Flow      Pain Score 6     Pain Loc      Pain Edu?      Excl. in [place]?    No data found.  Updated Vital Signs BP (!) 138/97   Pulse 77   Temp 99 F (37.2 C)   Resp 14   LMP 10/14/2019 (Exact Date)   SpO2 98%   Visual Acuity Right Eye Distance:   Left Eye Distance:   Bilateral Distance:    Right Eye Near:   Left Eye Near:    Bilateral Near:     Physical Exam Vitals and  nursing note reviewed.  Constitutional:      General: She is not in acute distress.    Appearance: She is well-developed.  HENT:     Head: Normocephalic and atraumatic.     Mouth/Throat:     Mouth: Mucous membranes are moist.  Eyes:     Extraocular Movements: Extraocular movements intact.     Conjunctiva/sclera: Conjunctivae normal.     Pupils: Pupils are equal, round, and reactive to light.     Comments: Left upper eyelid edematous and tender to touch.   Cardiovascular:     Rate and Rhythm: Normal rate and regular rhythm.     Heart sounds: No murmur heard.   Pulmonary:     Effort: Pulmonary effort is normal. No respiratory distress.     Breath sounds: Normal breath sounds.  Abdominal:     Palpations: Abdomen is soft.     Tenderness: There is no abdominal tenderness.  Musculoskeletal:     Cervical back: Neck supple.  Skin:    General: Skin is warm and dry.  Neurological:     General: No focal deficit present.     Mental Status: She is alert and oriented to person, place, and time.     Gait: Gait normal.  Psychiatric:        Mood and Affect: Mood normal.        Behavior: Behavior normal.      UC Treatments / Results  Labs (all labs ordered are listed, but only abnormal results are displayed) Labs Reviewed - No data to display  EKG   Radiology No results found.  Procedures Procedures (including critical care time)  Medications Ordered in UC Medications - No data to display  Initial Impression / Assessment and Plan / UC Course  I have reviewed the triage vital signs and the nursing notes.  Pertinent labs & imaging results that were available during my care of the patient were reviewed by me and considered in my medical decision making (see chart for details).   Left upper eyelid stye.  Elevated blood pressure reading.  Treating with Polytrim eyedrops and warm compresses.  Instructed patient to follow-up with her eye care provider on Monday if her symptoms or  not improving.  Instructed her to go to the ED if she has acute eye pain, changes in her vision, or other concerning symptoms.  Discussed that her blood pressure is elevated today needs to be rechecked by her PCP in 2 to 4 weeks.  Patient agrees to plan of care.   Final Clinical Impressions(s) / UC Diagnoses   Final diagnoses:  Hordeolum externum of left upper eyelid  Elevated blood pressure reading     Discharge Instructions     Use the antibiotic eyedrops as prescribed.    Follow-up with your eye doctor for a recheck in 1 to 2 days if your symptoms are not improving.    Go to the emergency department if you have acute eye pain, changes in your vision, or other concerning symptoms.     Your blood pressure is elevated today at 138/97.  Please have this rechecked by your primary care provider in 2-4 weeks.         ED Prescriptions    Medication Sig Dispense Auth. Provider   trimethoprim-polymyxin b (POLYTRIM) ophthalmic solution Place 1 drop into both eyes 4 (four) times daily for 7 days. 10 mL Sharion Balloon, NP     PDMP not reviewed this encounter.   Sharion Balloon, NP 10/30/19 1654

## 2019-10-30 NOTE — ED Triage Notes (Signed)
Patient c/o left eyelid swelling that started after waking up this morning. Reports the pain get worse when she closes her eye.

## 2019-11-04 ENCOUNTER — Other Ambulatory Visit: Payer: Self-pay | Admitting: Obstetrics and Gynecology

## 2019-11-04 ENCOUNTER — Encounter: Payer: Self-pay | Admitting: Obstetrics and Gynecology

## 2019-11-04 DIAGNOSIS — B3731 Acute candidiasis of vulva and vagina: Secondary | ICD-10-CM

## 2019-11-04 MED ORDER — FLUCONAZOLE 150 MG PO TABS: 150.0000 mg | ORAL_TABLET | Freq: Once | ORAL | 0 refills | Status: AC

## 2019-11-04 NOTE — Progress Notes (Signed)
Rx RF diflucan for yeast vag sx 

## 2019-12-01 ENCOUNTER — Encounter: Payer: Self-pay | Admitting: Obstetrics and Gynecology

## 2019-12-02 ENCOUNTER — Ambulatory Visit
Admission: EM | Admit: 2019-12-02 | Discharge: 2019-12-02 | Disposition: A | Discharge status: Home / Self Care | Payer: Medicaid Other | Attending: Emergency Medicine | Admitting: Emergency Medicine

## 2019-12-02 ENCOUNTER — Other Ambulatory Visit: Payer: Self-pay

## 2019-12-02 DIAGNOSIS — N898 Other specified noninflammatory disorders of vagina: Secondary | ICD-10-CM | POA: Diagnosis present

## 2019-12-02 LAB — POCT URINALYSIS DIP (MANUAL ENTRY)
Bilirubin, UA: NEGATIVE
Blood, UA: NEGATIVE
Glucose, UA: NEGATIVE mg/dL
Ketones, POC UA: NEGATIVE mg/dL
Leukocytes, UA: NEGATIVE
Nitrite, UA: NEGATIVE
Protein Ur, POC: NEGATIVE mg/dL
Spec Grav, UA: 1.025 (ref 1.010–1.025)
Urobilinogen, UA: 0.2 E.U./dL
pH, UA: 5.5 (ref 5.0–8.0)

## 2019-12-02 LAB — POCT URINE PREGNANCY: Preg Test, Ur: NEGATIVE

## 2019-12-02 MED ORDER — METRONIDAZOLE 500 MG PO TABS
500.0000 mg | ORAL_TABLET | Freq: Two times a day (BID) | ORAL | 0 refills | Status: DC
Start: 2019-12-02 — End: 2020-03-02

## 2019-12-02 NOTE — ED Provider Notes (Signed)
[name]    CSN: [id number] Arrival date & time: 12/02/19  9983      History   Chief Complaint Chief Complaint  Patient presents with  . Vaginal Itching    HPI Alicia Terry is a 29 y.o. female.   Patient presents with white malodorous thin vaginal discharge x 1 day.  She states her symptoms started after having a organic vaginal gel applied for waxing.  She states her symptoms are similar to previous episodes of BV.  She denies fever, chills, rash, lesions, abdominal pain, back pain, pelvic pain, or other symptoms.  Her medical history includes anemia, candidal vaginitis, bacterial vaginosis, chlamydia, ectopic pregnancy, fetal demise at 21 weeks on 07/21/2019.  The history is provided by the patient and medical records.    Past Medical History:  Diagnosis Date  . Anemia   . BV (bacterial vaginosis)   . Chlamydia   . Ectopic pregnancy     Patient Active Problem List   Diagnosis Date Noted  . Vaginal delivery 07/22/2019  . Postpartum care following vaginal delivery 07/22/2019  . Postpartum anemia 07/22/2019  . Vaginal bleeding in pregnancy, second trimester 07/21/2019  . Fetal demise affecting delivery 07/21/2019  . Pregnant 07/15/2019  . Supervision of low-risk pregnancy, second trimester 07/09/2019  . History of ectopic pregnancy 03/25/2019  . Pregnancy of unknown anatomic location 08/19/2018  . Abdominal pain, lower 08/07/2018    Past Surgical History:  Procedure Laterality Date  . CYST EXCISION     Chest  . NO PAST SURGERIES    . WISDOM TOOTH EXTRACTION      OB History    Gravida  2   Para  1   Term      Preterm  1   AB  1   Living  0     SAB      TAB      Ectopic  1   Multiple  0   Live Births               Home Medications    Prior to Admission medications   Medication Sig Start Date End Date Taking? Authorizing Provider  ferrous sulfate 325 (65 FE) MG tablet Take 1 tablet (325 mg total) by mouth daily.  07/22/19   Dalia Heading, CNM  metroNIDAZOLE (FLAGYL) 500 MG tablet Take 1 tablet (500 mg total) by mouth 2 (two) times daily. 12/02/19   Sharion Balloon, NP  Prenatal Vit-Fe Fumarate-FA (PRENATAL MULTIVITAMIN) TABS tablet Take 1 tablet by mouth daily at 12 noon. Patient not taking: Reported on 10/07/2019    [provider]    Family History Family History  Problem Relation Age of Onset  . Diabetes Mother   . Hypertension Mother   . Healthy Father     Social History Social History   Tobacco Use  . Smoking status: Never Smoker  . Smokeless tobacco: Never Used  Vaping Use  . Vaping Use: Never used  Substance Use Topics  . Alcohol use: Not Currently  . Drug use: Yes    Types: Marijuana     Allergies   Patient has no known allergies.   Review of Systems Review of Systems  Constitutional: Negative for chills and fever.  HENT: Negative for ear pain and sore throat.   Eyes: Negative for pain and visual disturbance.  Respiratory: Negative for cough and shortness of breath.   Cardiovascular: Negative for chest pain and palpitations.  Gastrointestinal: Negative for abdominal  pain and vomiting.  Genitourinary: Positive for vaginal discharge. Negative for dysuria, flank pain, hematuria and pelvic pain.  Musculoskeletal: Negative for arthralgias and back pain.  Skin: Negative for color change and rash.  Neurological: Negative for seizures and syncope.  All other systems reviewed and are negative.    Physical Exam Triage Vital Signs ED Triage Vitals  Enc Vitals Group     BP      Pulse      Resp      Temp      Temp src      SpO2      Weight      Height      Head Circumference      Peak Flow      Pain Score      Pain Loc      Pain Edu?      Excl. in [place]?    No data found.  Updated Vital Signs BP 124/84   Pulse 71   Temp 98.2 F (36.8 C) (Oral)   Resp 16   Ht 5\' 8"  (1.727 m)   Wt 210 lb (95.3 kg)   LMP 11/14/2019   SpO2 98%   BMI 31.93 kg/m    Visual Acuity Right Eye Distance:   Left Eye Distance:   Bilateral Distance:    Right Eye Near:   Left Eye Near:    Bilateral Near:     Physical Exam Vitals and nursing note reviewed.  Constitutional:      General: She is not in acute distress.    Appearance: She is well-developed. She is not ill-appearing.  HENT:     Head: Normocephalic and atraumatic.     Mouth/Throat:     Mouth: Mucous membranes are moist.     Pharynx: Oropharynx is clear.  Eyes:     Conjunctiva/sclera: Conjunctivae normal.  Cardiovascular:     Rate and Rhythm: Normal rate and regular rhythm.     Heart sounds: No murmur heard.   Pulmonary:     Effort: Pulmonary effort is normal. No respiratory distress.     Breath sounds: Normal breath sounds.  Abdominal:     Palpations: Abdomen is soft.     Tenderness: There is no abdominal tenderness. There is no right CVA tenderness, left CVA tenderness, guarding or rebound.  Musculoskeletal:     Cervical back: Neck supple.  Skin:    General: Skin is warm and dry.     Findings: No rash.  Neurological:     General: No focal deficit present.     Mental Status: She is alert and oriented to person, place, and time.     Gait: Gait normal.  Psychiatric:        Mood and Affect: Mood normal.        Behavior: Behavior normal.      UC Treatments / Results  Labs (all labs ordered are listed, but only abnormal results are displayed) Labs Reviewed  POCT URINE PREGNANCY  POCT URINALYSIS DIP (MANUAL ENTRY)  CERVICOVAGINAL ANCILLARY ONLY    EKG   Radiology No results found.  Procedures Procedures (including critical care time)  Medications Ordered in UC Medications - No data to display  Initial Impression / Assessment and Plan / UC Course  I have reviewed the triage vital signs and the nursing notes.  Pertinent labs & imaging results that were available during my care of the patient were reviewed by me and considered in my medical decision making (see  chart for details).    Vaginal discharge.  Urine pregnancy negative.  Urine does not show signs of infection.  Patient obtained vaginal self swab for testing.  Treating with metronidazole.  Discussed with patient that she may require additional treatment if her STD tests are positive.  Discussed that her sexual partner may also require treatment.  Instructed her to abstain from sexual activity until her test results are back.  Instructed her to follow-up with her PCP or OB/GYN if her symptoms are not improving.  Patient agrees to plan of care.     Final Clinical Impressions(s) / UC Diagnoses   Final diagnoses:  Vaginal discharge     Discharge Instructions     Your urine pregnancy test is negative.  Your urine does not show signs of infection.  Take metronidazole twice a day for 7 days.    Your vaginal tests are pending.  If your test results are positive, we will call you.  You and your sexual partner(s) may require treatment at that time.  Do not have sexual activity until the test results are back.    Follow up with your primary care provider or gynecologist if your symptoms are not improving.        ED Prescriptions    Medication Sig Dispense Auth. Provider   metroNIDAZOLE (FLAGYL) 500 MG tablet Take 1 tablet (500 mg total) by mouth 2 (two) times daily. 14 tablet Sharion Balloon, NP     PDMP not reviewed this encounter.   Sharion Balloon, NP 12/02/19 1015

## 2019-12-02 NOTE — ED Triage Notes (Signed)
Pt reports having a small amount of vaginal discharge that began yesterday. Pt has a hx of BV and sts this feels the same.

## 2019-12-02 NOTE — Discharge Instructions (Addendum)
Your urine pregnancy test is negative.  Your urine does not show signs of infection.  Take metronidazole twice a day for 7 days.    Your vaginal tests are pending.  If your test results are positive, we will call you.  You and your sexual partner(s) may require treatment at that time.  Do not have sexual activity until the test results are back.    Follow up with your primary care provider or gynecologist if your symptoms are not improving.

## 2019-12-07 ENCOUNTER — Telehealth (HOSPITAL_COMMUNITY): Payer: Self-pay | Admitting: Emergency Medicine

## 2019-12-07 LAB — CERVICOVAGINAL ANCILLARY ONLY
Bacterial Vaginitis (gardnerella): POSITIVE — AB
Candida Glabrata: NEGATIVE
Candida Vaginitis: POSITIVE — AB
Chlamydia: NEGATIVE
Comment: NEGATIVE
Comment: NEGATIVE
Comment: NEGATIVE
Comment: NEGATIVE
Comment: NEGATIVE
Comment: NORMAL
Neisseria Gonorrhea: NEGATIVE
Trichomonas: NEGATIVE

## 2019-12-07 MED ORDER — FLUCONAZOLE 150 MG PO TABS
150.0000 mg | ORAL_TABLET | Freq: Once | ORAL | 0 refills | Status: DC
Start: 2019-12-07 — End: 2020-05-11

## 2019-12-07 NOTE — Telephone Encounter (Signed)
Patient called for results from cervicovaginal swab.  Results not noted in Epic.  Called down to microbiology to follow up, who states re-running test today due to indeterminate result.  All STIs negative at this time.  Returned patient's call, verified identity and reviewed with her, told her to expect the final results later today.  She verbalized understanding.

## 2020-02-25 ENCOUNTER — Encounter: Payer: Self-pay | Admitting: Obstetrics and Gynecology

## 2020-03-02 ENCOUNTER — Encounter: Payer: Self-pay | Admitting: Obstetrics and Gynecology

## 2020-03-02 ENCOUNTER — Other Ambulatory Visit: Payer: Self-pay | Admitting: Obstetrics and Gynecology

## 2020-03-02 MED ORDER — METRONIDAZOLE 500 MG PO TABS: 500.0000 mg | ORAL_TABLET | Freq: Two times a day (BID) | ORAL | 0 refills | Status: DC

## 2020-03-02 NOTE — Progress Notes (Signed)
Rx RF flagyl for BV sx.  

## 2020-03-16 ENCOUNTER — Encounter: Payer: Self-pay | Admitting: *Deleted

## 2020-03-16 ENCOUNTER — Emergency Department: Payer: Medicaid Other

## 2020-03-16 ENCOUNTER — Emergency Department
Admission: EM | Admit: 2020-03-16 | Discharge: 2020-03-16 | Disposition: A | Discharge status: Home / Self Care | Payer: Medicaid Other | Attending: Emergency Medicine | Admitting: Emergency Medicine

## 2020-03-16 ENCOUNTER — Other Ambulatory Visit: Payer: Self-pay

## 2020-03-16 DIAGNOSIS — S82891A Other fracture of right lower leg, initial encounter for closed fracture: Secondary | ICD-10-CM

## 2020-03-16 DIAGNOSIS — S8002XA Contusion of left knee, initial encounter: Secondary | ICD-10-CM | POA: Diagnosis not present

## 2020-03-16 DIAGNOSIS — Y92481 Parking lot as the place of occurrence of the external cause: Secondary | ICD-10-CM | POA: Insufficient documentation

## 2020-03-16 DIAGNOSIS — S8291XA Unspecified fracture of right lower leg, initial encounter for closed fracture: Secondary | ICD-10-CM | POA: Diagnosis not present

## 2020-03-16 DIAGNOSIS — M7989 Other specified soft tissue disorders: Secondary | ICD-10-CM | POA: Diagnosis not present

## 2020-03-16 DIAGNOSIS — Z041 Encounter for examination and observation following transport accident: Secondary | ICD-10-CM | POA: Diagnosis not present

## 2020-03-16 DIAGNOSIS — S99911A Unspecified injury of right ankle, initial encounter: Secondary | ICD-10-CM | POA: Diagnosis present

## 2020-03-16 DIAGNOSIS — S8262XA Displaced fracture of lateral malleolus of left fibula, initial encounter for closed fracture: Secondary | ICD-10-CM | POA: Diagnosis not present

## 2020-03-16 DIAGNOSIS — S9032XA Contusion of left foot, initial encounter: Secondary | ICD-10-CM | POA: Diagnosis not present

## 2020-03-16 DIAGNOSIS — S60212A Contusion of left wrist, initial encounter: Secondary | ICD-10-CM | POA: Diagnosis not present

## 2020-03-16 DIAGNOSIS — S0990XA Unspecified injury of head, initial encounter: Secondary | ICD-10-CM | POA: Diagnosis not present

## 2020-03-16 DIAGNOSIS — T07XXXA Unspecified multiple injuries, initial encounter: Secondary | ICD-10-CM

## 2020-03-16 MED ORDER — METHOCARBAMOL 500 MG PO TABS: 500.0000 mg | ORAL_TABLET | Freq: Four times a day (QID) | ORAL | 0 refills | Status: DC

## 2020-03-16 MED ORDER — MELOXICAM 15 MG PO TABS
15.0000 mg | ORAL_TABLET | Freq: Every day | ORAL | 0 refills | Status: DC
Start: 2020-03-16 — End: 2020-08-05
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
## 2020-05-11 ENCOUNTER — Other Ambulatory Visit: Payer: Self-pay | Admitting: Obstetrics and Gynecology

## 2020-05-11 ENCOUNTER — Encounter: Payer: Self-pay | Admitting: Obstetrics and Gynecology

## 2020-05-11 MED ORDER — FLUCONAZOLE 150 MG PO TABS: 150.0000 mg | ORAL_TABLET | Freq: Once | ORAL | 0 refills | Status: AC

## 2020-05-11 NOTE — Progress Notes (Signed)
Rx RF diflucan for yeast vag sx. Hx of recurrent yeast and BV

## 2020-07-30 ENCOUNTER — Encounter: Payer: Self-pay | Admitting: Nurse Practitioner

## 2020-07-30 DIAGNOSIS — D649 Anemia, unspecified: Secondary | ICD-10-CM | POA: Insufficient documentation

## 2020-07-30 DIAGNOSIS — D509 Iron deficiency anemia, unspecified: Secondary | ICD-10-CM | POA: Insufficient documentation

## 2020-07-30 DIAGNOSIS — Z8759 Personal history of other complications of pregnancy, childbirth and the puerperium: Secondary | ICD-10-CM | POA: Insufficient documentation

## 2020-08-05 ENCOUNTER — Ambulatory Visit: Payer: Medicaid Other | Admitting: Nurse Practitioner

## 2020-08-05 ENCOUNTER — Encounter: Payer: Self-pay | Admitting: Nurse Practitioner

## 2020-08-05 ENCOUNTER — Other Ambulatory Visit: Payer: Self-pay

## 2020-08-05 VITALS — BP 127/76 | HR 88 | Temp 98.4°F | Ht 67.72 in | Wt 268.2 lb

## 2020-08-05 DIAGNOSIS — O99210 Obesity complicating pregnancy, unspecified trimester: Secondary | ICD-10-CM | POA: Insufficient documentation

## 2020-08-05 DIAGNOSIS — Z6841 Body Mass Index (BMI) 40.0 and over, adult: Secondary | ICD-10-CM

## 2020-08-05 DIAGNOSIS — Z8249 Family history of ischemic heart disease and other diseases of the circulatory system: Secondary | ICD-10-CM

## 2020-08-05 DIAGNOSIS — D508 Other iron deficiency anemias: Secondary | ICD-10-CM

## 2020-08-05 DIAGNOSIS — Z1159 Encounter for screening for other viral diseases: Secondary | ICD-10-CM

## 2020-08-05 DIAGNOSIS — Z833 Family history of diabetes mellitus: Secondary | ICD-10-CM | POA: Diagnosis not present

## 2020-08-05 DIAGNOSIS — E669 Obesity, unspecified: Secondary | ICD-10-CM | POA: Insufficient documentation

## 2020-08-05 DIAGNOSIS — Z7689 Persons encountering health services in other specified circumstances: Secondary | ICD-10-CM | POA: Diagnosis not present

## 2020-08-05 NOTE — Assessment & Plan Note (Signed)
Strong family history on mother's side, has not ate since this morning.  Will check lipid panel and CMP today.

## 2020-08-05 NOTE — Assessment & Plan Note (Signed)
Strong family history on mother's side, will check A1c today and annually.

## 2020-08-05 NOTE — Assessment & Plan Note (Signed)
Chronic, ongoing, no recheck on labs since June 2021.  She is taking daily iron supplement and multivitamin.  Check CBC, iron, ferritin, B12, TSH, CMP today.  Adjust supplements as needed.  Discussed with patient will consider hematology referral if ongoing lower levels with supplements on board.  Return in 6 months.

## 2020-08-05 NOTE — Patient Instructions (Signed)

## 2020-08-05 NOTE — Progress Notes (Signed)
New Patient Office Visit  Subjective:  Patient ID: Alicia Terry, female    DOB: [birth year]  Age: 30 y.o. MRN: [id number]  CC:  Chief Complaint  Patient presents with   New Patient (Initial Visit)    Family h/o DM and HTN    HPI Alicia Terry presents for new patient visit to establish care.  Introduced to Designer, jewellery role and practice setting.  All questions answered.  Discussed provider/patient relationship and expectations.   Has not had a physical in several years.  Last pap January 2021 with [facility].  She was incarcerated last year, her ex got in trouble and she went to jail in [facility] for 43 months.  FAMILY HISTORY HTN AND T2DM Mother has HTN and diabetes, sister has diabetes, and many on her mother's side with diabetes and hypertension.  Her father is very healthy and runs every morning -- his family is also very healthy.    She has been checking BP at home, they have been 120/70 range -- had one 140/80 recently when started intense work-outs, had some swollen ankles, which are now improving. Recurrent headaches: no Visual changes: no Palpitations: no Dyspnea: no Chest pain: no Lower extremity edema: no Dizzy/lightheaded: no   ANEMIA Takes iron pill and daily vitamin.  H/H 9.8/30.1 in June 2021, no recent iron level.  Notices levels low with menstrual cycles, does not have heavy menstrual cycles. Mother has anemia too and had hysterectomy at age 44. Anemia status: stable Etiology of anemia: iron deficiency Duration of anemia treatment: chronic Compliance with treatment: good compliance Iron supplementation side effects: no Severity of anemia: mild Fatigue: no Decreased exercise tolerance: no  Dyspnea on exertion: no Palpitations: no Bleeding: no Pica: no   Past Medical History:  Diagnosis Date   Anemia    BV (bacterial vaginosis)    Chlamydia    Ectopic pregnancy     Past Surgical History:  Procedure Laterality Date   CYST  EXCISION     Chest   NO PAST SURGERIES     WISDOM TOOTH EXTRACTION      Family History  Problem Relation Age of Onset   Diabetes Mother    Hypertension Mother    Healthy Father    Diabetes Sister    Heart disease Maternal Grandmother    Hypertension Maternal Grandmother    Depression Maternal Grandmother    Heart disease Maternal Grandfather    Hypertension Maternal Grandfather    Depression Maternal Grandfather    Dementia Paternal Grandmother     Social History   Socioeconomic History   Marital status: Single    Spouse name: Not on file   Number of children: Not on file   Years of education: Not on file   Highest education level: Not on file  Occupational History   Occupation: Beautician  Tobacco Use   Smoking status: Never   Smokeless tobacco: Never  Vaping Use   Vaping Use: Never used  Substance and Sexual Activity   Alcohol use: Not Currently   Drug use: Yes    Types: Marijuana   Sexual activity: Yes    Birth control/protection: None  Other Topics Concern   Not on file  Social History Narrative   Not on file   Social Determinants of Health   Financial Resource Strain: Low Risk    Difficulty of Paying Living Expenses: Not hard at all  Food Insecurity: No Food Insecurity   Worried About Charity fundraiser in  the Last Year: Never true   Ran Out of Food in the Last Year: Never true  Transportation Needs: No Transportation Needs   Lack of Transportation (Medical): No   Lack of Transportation (Non-Medical): No  Physical Activity: Sufficiently Active   Days of Exercise per Week: 7 days   Minutes of Exercise per Session: 30 min  Stress: No Stress Concern Present   Feeling of Stress : Only a little  Social Connections: Socially Isolated   Frequency of Communication with Friends and Family: Twice a week   Frequency of Social Gatherings with Friends and Family: Twice a week   Attends Religious Services: Never   Printmaker: No    Attends Music therapist: Never   Marital Status: Never married  Human resources officer Violence: Not At Risk   Fear of Current or Ex-Partner: No   Emotionally Abused: No   Physically Abused: No   Sexually Abused: No    ROS Review of Systems  Constitutional:  Negative for activity change, appetite change, diaphoresis, fatigue and fever.  Respiratory:  Negative for cough, chest tightness and shortness of breath.   Cardiovascular:  Negative for chest pain, palpitations and leg swelling.  Gastrointestinal: Negative.   Endocrine: Negative.   Neurological: Negative.   Psychiatric/Behavioral: Negative.     Objective:   Today's Vitals: BP 127/76   Pulse 88   Temp 98.4 F (36.9 C) (Oral)   Ht 5' 7.72" (1.72 m)   Wt 268 lb 3.2 oz (121.7 kg)   SpO2 97%   BMI 41.12 kg/m   Physical Exam Vitals and nursing note reviewed.  Constitutional:      General: She is awake. She is not in acute distress.    Appearance: She is well-developed and well-groomed. She is obese. She is not ill-appearing or toxic-appearing.  HENT:     Head: Normocephalic.     Right Ear: Hearing normal.     Left Ear: Hearing normal.  Eyes:     General: Lids are normal.        Right eye: No discharge.        Left eye: No discharge.     Conjunctiva/sclera: Conjunctivae normal.     Pupils: Pupils are equal, round, and reactive to light.  Neck:     Thyroid: No thyromegaly.     Vascular: No carotid bruit.  Cardiovascular:     Rate and Rhythm: Normal rate and regular rhythm.     Heart sounds: Normal heart sounds. No murmur heard.   No gallop.  Pulmonary:     Effort: Pulmonary effort is normal. No accessory muscle usage or respiratory distress.     Breath sounds: Normal breath sounds.  Abdominal:     General: Bowel sounds are normal.     Palpations: Abdomen is soft.  Musculoskeletal:     Cervical back: Normal range of motion and neck supple.     Right lower leg: No edema.     Left lower leg: No  edema.  Lymphadenopathy:     Cervical: No cervical adenopathy.  Skin:    General: Skin is warm and dry.  Neurological:     Mental Status: She is alert and oriented to person, place, and time.     Deep Tendon Reflexes: Reflexes are normal and symmetric.     Reflex Scores:      Brachioradialis reflexes are 2+ on the right side and 2+ on the left side.  Patellar reflexes are 2+ on the right side and 2+ on the left side. Psychiatric:        Attention and Perception: Attention normal.        Mood and Affect: Mood normal.        Speech: Speech normal.        Behavior: Behavior normal. Behavior is cooperative.        Thought Content: Thought content normal.    Assessment & Plan:   Problem List Items Addressed This Visit       Other   Iron deficiency anemia    Chronic, ongoing, no recheck on labs since June 2021.  She is taking daily iron supplement and multivitamin.  Check CBC, iron, ferritin, B12, TSH, CMP today.  Adjust supplements as needed.  Discussed with patient will consider hematology referral if ongoing lower levels with supplements on board.  Return in 6 months.       Relevant Orders   CBC with Differential/Platelet   Vitamin B12   TSH   Comprehensive metabolic panel   Iron, TIBC and Ferritin Panel   Family history of diabetes mellitus    Strong family history on mother's side, will check A1c today and annually.       Relevant Orders   HgB A1c   Family history of cardiac disorder    Strong family history on mother's side, has not ate since this morning.  Will check lipid panel and CMP today.       Relevant Orders   Lipid Panel w/o Chol/HDL Ratio   Obesity    BMI 41.12, she is working on loss.  Recommended eating smaller high protein, low fat meals more frequently and exercising 30 mins a day 5 times a week with a goal of 10-15lb weight loss in the next 3 months. Patient voiced their understanding and motivation to adhere to these recommendations.         Other Visit Diagnoses     Encounter to establish care    -  Primary   Need for hepatitis C screening test       Hep C screening on labs today per guideline recommendations, discussed with patient.   Relevant Orders   Hepatitis C antibody       Outpatient Encounter Medications as of 08/05/2020  Medication Sig   ferrous sulfate 325 (65 FE) MG tablet Take 1 tablet (325 mg total) by mouth daily.   [DISCONTINUED] meloxicam (MOBIC) 15 MG tablet Take 1 tablet (15 mg total) by mouth daily. (Patient not taking: Reported on 08/05/2020)   [DISCONTINUED] methocarbamol (ROBAXIN) 500 MG tablet Take 1 tablet (500 mg total) by mouth 4 (four) times daily. (Patient not taking: Reported on 08/05/2020)   [DISCONTINUED] metroNIDAZOLE (FLAGYL) 500 MG tablet Take 1 tablet (500 mg total) by mouth 2 (two) times daily. (Patient not taking: Reported on 08/05/2020)   [DISCONTINUED] Prenatal Vit-Fe Fumarate-FA (PRENATAL MULTIVITAMIN) TABS tablet Take 1 tablet by mouth daily at 12 noon. (Patient not taking: No sig reported)   No facility-administered encounter medications on file as of 08/05/2020.    Follow-up: Return in about 6 months (around 02/05/2021) for Anemia.   Venita Lick, NP

## 2020-08-05 NOTE — Assessment & Plan Note (Signed)
BMI 41.12, she is working on loss.  Recommended eating smaller high protein, low fat meals more frequently and exercising 30 mins a day 5 times a week with a goal of 10-15lb weight loss in the next 3 months. Patient voiced their understanding and motivation to adhere to these recommendations.

## 2020-08-06 LAB — IRON,TIBC AND FERRITIN PANEL
Ferritin: 61 ng/mL (ref 15–150)
Iron Saturation: 23 % (ref 15–55)
Iron: 81 ug/dL (ref 27–159)
Total Iron Binding Capacity: 345 ug/dL (ref 250–450)
UIBC: 264 ug/dL (ref 131–425)

## 2020-08-06 LAB — CBC WITH DIFFERENTIAL/PLATELET
Basophils Absolute: 0 10*3/uL (ref 0.0–0.2)
Basos: 1 %
EOS (ABSOLUTE): 0.1 10*3/uL (ref 0.0–0.4)
Eos: 1 %
Hematocrit: 38.4 % (ref 34.0–46.6)
Hemoglobin: 12.4 g/dL (ref 11.1–15.9)
Immature Grans (Abs): 0 10*3/uL (ref 0.0–0.1)
Immature Granulocytes: 0 %
Lymphocytes Absolute: 1.5 10*3/uL (ref 0.7–3.1)
Lymphs: 33 %
MCH: 27 pg (ref 26.6–33.0)
MCHC: 32.3 g/dL (ref 31.5–35.7)
MCV: 84 fL (ref 79–97)
Monocytes Absolute: 0.5 10*3/uL (ref 0.1–0.9)
Monocytes: 11 %
Neutrophils Absolute: 2.3 10*3/uL (ref 1.4–7.0)
Neutrophils: 54 %
Platelets: 250 10*3/uL (ref 150–450)
RBC: 4.59 x10E6/uL (ref 3.77–5.28)
RDW: 12.7 % (ref 11.7–15.4)
WBC: 4.4 10*3/uL (ref 3.4–10.8)

## 2020-08-06 LAB — COMPREHENSIVE METABOLIC PANEL
ALT: 23 IU/L (ref 0–32)
AST: 19 IU/L (ref 0–40)
Albumin/Globulin Ratio: 2 (ref 1.2–2.2)
Albumin: 4.4 g/dL (ref 3.8–4.8)
Alkaline Phosphatase: 46 IU/L (ref 44–121)
BUN/Creatinine Ratio: 8 — ABNORMAL LOW (ref 9–23)
BUN: 7 mg/dL (ref 6–20)
Bilirubin Total: 0.3 mg/dL (ref 0.0–1.2)
CO2: 20 mmol/L (ref 20–29)
Calcium: 9.4 mg/dL (ref 8.7–10.2)
Chloride: 103 mmol/L (ref 96–106)
Creatinine, Ser: 0.89 mg/dL (ref 0.57–1.00)
Globulin, Total: 2.2 g/dL (ref 1.5–4.5)
Glucose: 78 mg/dL (ref 65–99)
Potassium: 4.2 mmol/L (ref 3.5–5.2)
Sodium: 138 mmol/L (ref 134–144)
Total Protein: 6.6 g/dL (ref 6.0–8.5)
eGFR: 89 mL/min/{1.73_m2} (ref >59)

## 2020-08-06 LAB — LIPID PANEL W/O CHOL/HDL RATIO
Cholesterol, Total: 135 mg/dL (ref 100–199)
HDL: 50 mg/dL (ref >39)
LDL Chol Calc (NIH): 76 mg/dL (ref 0–99)
Triglycerides: 34 mg/dL (ref 0–149)
VLDL Cholesterol Cal: 9 mg/dL (ref 5–40)

## 2020-08-06 LAB — HEMOGLOBIN A1C
Est. average glucose Bld gHb Est-mCnc: 111 mg/dL
Hgb A1c MFr Bld: 5.5 % (ref 4.8–5.6)

## 2020-08-06 LAB — VITAMIN B12: Vitamin B-12: 714 pg/mL (ref 232–1245)

## 2020-08-06 LAB — TSH: TSH: 0.781 u[IU]/mL (ref 0.450–4.500)

## 2020-08-06 LAB — HEPATITIS C ANTIBODY: Hep C Virus Ab: <0.1 s/co ratio (ref 0.0–0.9)

## 2020-08-07 NOTE — Progress Notes (Signed)
Contacted via [facility] morning Alicia Terry, your labs have returned.  Everything looks great!!  A1c shows no prediabetes or diabetes.  CBC and iron + B12 show no anemia at this time.  Overall great labs!!  No concerns.  Keep up the great work!! Keep being awesome!!  Thank you for allowing me to participate in your care.  I appreciate you. Kindest regards, Kaelea Gathright

## 2020-09-27 ENCOUNTER — Encounter: Payer: Self-pay | Admitting: Obstetrics and Gynecology

## 2020-09-28 ENCOUNTER — Other Ambulatory Visit: Payer: Self-pay | Admitting: Obstetrics and Gynecology

## 2020-09-28 MED ORDER — METRONIDAZOLE 500 MG PO TABS: 500.0000 mg | ORAL_TABLET | Freq: Two times a day (BID) | ORAL | 0 refills | Status: DC

## 2020-09-28 NOTE — Progress Notes (Signed)
Rx RF flagyl for BV sx.

## 2020-10-04 ENCOUNTER — Other Ambulatory Visit: Payer: Self-pay

## 2020-10-04 ENCOUNTER — Ambulatory Visit: Payer: Medicaid Other | Admitting: Obstetrics & Gynecology

## 2020-10-04 ENCOUNTER — Other Ambulatory Visit (HOSPITAL_COMMUNITY)
Admission: RE | Admit: 2020-10-04 | Discharge: 2020-10-04 | Disposition: A | Discharge status: Home / Self Care | Payer: Medicaid Other | Source: Ambulatory Visit | Attending: Obstetrics & Gynecology | Admitting: Obstetrics & Gynecology

## 2020-10-04 ENCOUNTER — Encounter: Payer: Self-pay | Admitting: Obstetrics & Gynecology

## 2020-10-04 VITALS — BP 120/80 | Ht 68.0 in | Wt 275.0 lb

## 2020-10-04 DIAGNOSIS — Z113 Encounter for screening for infections with a predominantly sexual mode of transmission: Secondary | ICD-10-CM

## 2020-10-04 DIAGNOSIS — Z124 Encounter for screening for malignant neoplasm of cervix: Secondary | ICD-10-CM

## 2020-10-04 DIAGNOSIS — Z01419 Encounter for gynecological examination (general) (routine) without abnormal findings: Secondary | ICD-10-CM | POA: Diagnosis not present

## 2020-10-04 NOTE — Progress Notes (Signed)
HPI:      [name] is a 30 y.o. G2P0110 who LMP was Patient's last menstrual period was 09/06/2020., she presents today for her annual examination. The patient has no complaints today. 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 The patient is sexually active. Her last pap: was normal. The patient does perform self breast exams.  There is no notable family history of breast or ovarian cancer in her family.  The patient has regular exercise: yes.  The patient denies current symptoms of depression.  Reg cycles  GYN History: Contraception: none other than condoms  PMHx: Past Medical History:  Diagnosis Date   Anemia    BV (bacterial vaginosis)    Chlamydia    Ectopic pregnancy    Past Surgical History:  Procedure Laterality Date   CYST EXCISION     Chest   NO PAST SURGERIES     WISDOM TOOTH EXTRACTION     Family History  Problem Relation Age of Onset   Diabetes Mother    Hypertension Mother    Healthy Father    Diabetes Sister    Heart disease Maternal Grandmother    Hypertension Maternal Grandmother    Depression Maternal Grandmother    Heart disease Maternal Grandfather    Hypertension Maternal Grandfather    Depression Maternal Grandfather    Dementia Paternal Grandmother    Social History   Tobacco Use   Smoking status: Never   Smokeless tobacco: Never  Vaping Use   Vaping Use: Never used  Substance Use Topics   Alcohol use: Not Currently   Drug use: Yes    Types: Marijuana    Current Outpatient Medications:    ferrous sulfate 325 (65 FE) MG tablet, Take 1 tablet (325 mg total) by mouth daily., Disp: , Rfl: 3   metroNIDAZOLE (FLAGYL) 500 MG tablet, Take 1 tablet (500 mg total) by mouth 2 (two) times daily., Disp: 14 tablet, Rfl: 0 Allergies: Patient has no known allergies.  Review of Systems  Constitutional:  Negative for chills, fever and malaise/fatigue.  HENT:  Negative for congestion, sinus pain and sore throat.    Eyes:  Negative for blurred vision and pain.  Respiratory:  Negative for cough and wheezing.   Cardiovascular:  Negative for chest pain and leg swelling.  Gastrointestinal:  Negative for abdominal pain, constipation, diarrhea, heartburn, nausea and vomiting.  Genitourinary:  Negative for dysuria, frequency, hematuria and urgency.  Musculoskeletal:  Negative for back pain, joint pain, myalgias and neck pain.  Skin:  Negative for itching and rash.  Neurological:  Negative for dizziness, tremors and weakness.  Endo/Heme/Allergies:  Does not bruise/bleed easily.  Psychiatric/Behavioral:  Negative for depression. The patient is not nervous/anxious and does not have insomnia.    Objective: BP 120/80   Ht '5\' 8"'$  (1.727 m)   Wt 275 lb (124.7 kg)   LMP 09/06/2020   BMI 41.81 kg/m   Filed Weights   10/04/20 1353  Weight: 275 lb (124.7 kg)   Body mass index is 41.81 kg/m. Physical Exam Constitutional:      General: She is not in acute distress.    Appearance: She is well-developed.  Genitourinary:     Bladder, rectum and urethral meatus normal.     No lesions in the vagina.     Right Labia: No rash, tenderness or lesions.    Left Labia: No tenderness, lesions or rash.    No vaginal bleeding.  Right Adnexa: not tender and no mass present.    Left Adnexa: not tender and no mass present.    No cervical motion tenderness, friability, lesion or polyp.     Uterus is not enlarged.     No uterine mass detected.    Pelvic exam was performed with patient in the lithotomy position.  Breasts:    Right: No mass, skin change or tenderness.     Left: No mass, skin change or tenderness.  HENT:     Head: Normocephalic and atraumatic. No laceration.     Right Ear: Hearing normal.     Left Ear: Hearing normal.     Mouth/Throat:     Pharynx: Uvula midline.  Eyes:     Pupils: Pupils are equal, round, and reactive to light.  Neck:     Thyroid: No thyromegaly.  Cardiovascular:     Rate and  Rhythm: Normal rate and regular rhythm.     Heart sounds: No murmur heard.   No friction rub. No gallop.  Pulmonary:     Effort: Pulmonary effort is normal. No respiratory distress.     Breath sounds: Normal breath sounds. No wheezing.  Abdominal:     General: Bowel sounds are normal. There is no distension.     Palpations: Abdomen is soft.     Tenderness: There is no abdominal tenderness. There is no rebound.  Musculoskeletal:        General: Normal range of motion.     Cervical back: Normal range of motion and neck supple.  Neurological:     Mental Status: She is alert and oriented to person, place, and time.     Cranial Nerves: No cranial nerve deficit.  Skin:    General: Skin is warm and dry.  Psychiatric:        Judgment: Judgment normal.  Vitals reviewed.    Assessment:  ANNUAL EXAM 1. Women's annual routine gynecological examination   2. Screening for STD (sexually transmitted disease)   3. Screening for cervical cancer      Screening Plan:            1.  Cervical Screening-  Pap smear done today  2.  Labs  - STD labs per request today  3.  Counseling for contraception: no method desired other than condoms  [facility] - 10/04/20 1355       Pregnancy Intention Screening   Does the patient want to become pregnant in the next year? No    Does the patient's partner want to become pregnant in the next year? No    Would the patient like to discuss contraceptive options today? No      Contraception Wrap Up   Current Method Female Condom    End Method Female Condom    Contraception Counseling Provided No              F/U  Return in about 1 year (around 10/04/2021) for Annual.  Barnett Applebaum, MD, [facility], [facility] 10/04/2020  2:16 PM

## 2020-10-06 ENCOUNTER — Other Ambulatory Visit: Payer: Self-pay | Admitting: Obstetrics & Gynecology

## 2020-10-06 LAB — CYTOLOGY - PAP
Comment: NEGATIVE
Diagnosis: NEGATIVE
High risk HPV: NEGATIVE

## 2020-10-06 LAB — CERVICOVAGINAL ANCILLARY ONLY
Bacterial Vaginitis (gardnerella): POSITIVE — AB
Candida Glabrata: NEGATIVE
Candida Vaginitis: NEGATIVE
Chlamydia: NEGATIVE
Comment: NEGATIVE
Comment: NEGATIVE
Comment: NEGATIVE
Comment: NEGATIVE
Comment: NEGATIVE
Comment: NORMAL
Neisseria Gonorrhea: NEGATIVE
Trichomonas: NEGATIVE

## 2020-10-06 MED ORDER — METRONIDAZOLE 500 MG PO TABS: 500.0000 mg | ORAL_TABLET | Freq: Two times a day (BID) | ORAL | 0 refills | Status: DC

## 2020-10-07 LAB — RPR+HSVIGM+HBSAG+HSV2(IGG)+...
HIV Screen 4th Generation wRfx: NONREACTIVE
HSV 2 IgG, Type Spec: <0.91 index (ref 0.00–0.90)
HSVI/II Comb IgM: 1.09 Ratio — ABNORMAL HIGH (ref 0.00–0.90)
Hepatitis B Surface Ag: NEGATIVE
RPR Ser Ql: NONREACTIVE

## 2020-10-17 ENCOUNTER — Other Ambulatory Visit: Payer: Self-pay | Admitting: Obstetrics & Gynecology

## 2020-10-17 DIAGNOSIS — Z113 Encounter for screening for infections with a predominantly sexual mode of transmission: Secondary | ICD-10-CM

## 2020-10-17 NOTE — Progress Notes (Signed)
Labs ordered.  Pt desires Wed at 0800  D/w pt abnormal lab results and need for follow up.  Pt has no past h/o HSV and no known exposure.

## 2020-10-19 ENCOUNTER — Other Ambulatory Visit: Payer: Medicaid Other

## 2020-10-24 NOTE — Telephone Encounter (Signed)
Pt calling; was given antibx; now needs rx for yeast pill.  [phone]

## 2020-10-25 ENCOUNTER — Other Ambulatory Visit: Payer: Self-pay | Admitting: Obstetrics & Gynecology

## 2020-10-25 DIAGNOSIS — H5213 Myopia, bilateral: Secondary | ICD-10-CM | POA: Diagnosis not present

## 2020-10-25 MED ORDER — FLUCONAZOLE 150 MG PO TABS: 150.0000 mg | ORAL_TABLET | Freq: Once | ORAL | 3 refills | Status: AC

## 2020-10-27 ENCOUNTER — Other Ambulatory Visit: Payer: Medicaid Other

## 2020-11-05 DIAGNOSIS — B009 Herpesviral infection, unspecified: Secondary | ICD-10-CM

## 2020-11-05 HISTORY — DX: Herpesviral infection, unspecified: B00.9

## 2020-11-13 ENCOUNTER — Other Ambulatory Visit: Payer: Self-pay | Admitting: Obstetrics & Gynecology

## 2020-11-13 DIAGNOSIS — Z113 Encounter for screening for infections with a predominantly sexual mode of transmission: Secondary | ICD-10-CM

## 2020-11-16 ENCOUNTER — Encounter (HOSPITAL_BASED_OUTPATIENT_CLINIC_OR_DEPARTMENT_OTHER): Payer: Self-pay | Admitting: Obstetrics and Gynecology

## 2020-11-16 ENCOUNTER — Other Ambulatory Visit: Payer: Self-pay

## 2020-11-16 ENCOUNTER — Emergency Department (HOSPITAL_BASED_OUTPATIENT_CLINIC_OR_DEPARTMENT_OTHER)
Admission: EM | Admit: 2020-11-16 | Discharge: 2020-11-16 | Disposition: A | Discharge status: Home / Self Care | Payer: Medicaid Other | Attending: Emergency Medicine | Admitting: Emergency Medicine

## 2020-11-16 DIAGNOSIS — J029 Acute pharyngitis, unspecified: Secondary | ICD-10-CM | POA: Insufficient documentation

## 2020-11-16 DIAGNOSIS — R222 Localized swelling, mass and lump, trunk: Secondary | ICD-10-CM | POA: Insufficient documentation

## 2020-11-16 DIAGNOSIS — Z711 Person with feared health complaint in whom no diagnosis is made: Secondary | ICD-10-CM

## 2020-11-16 DIAGNOSIS — Z202 Contact with and (suspected) exposure to infections with a predominantly sexual mode of transmission: Secondary | ICD-10-CM | POA: Diagnosis not present

## 2020-11-16 LAB — GROUP A STREP BY PCR: Group A Strep by PCR: NOT DETECTED

## 2020-11-16 NOTE — ED Triage Notes (Signed)
Patient reports to the ER as she feels as if her vagina is burning. Patient reports she was seen by GYN and told she had slightly elevated HSV levels. Patient reports the level was 1.08. Patient concerned also because she has burning in her throat and is concerned for having HSV in her throat

## 2020-11-16 NOTE — ED Provider Notes (Signed)
[facility] Provider Note   CSN: [id number] Arrival date & time: 11/16/20  2215     History Chief Complaint  Patient presents with   Sore Throat   Groin Swelling    Alicia Terry is a 30 y.o. female.  Patient concern for herpes lesion on her vagina and in her throat.  No history of herpes but may be had elevated HCV levels that were recently checked.  The history is provided by the patient.  Sore Throat This is a new problem. The problem has not changed since onset.Nothing aggravates the symptoms. Nothing relieves the symptoms. She has tried nothing for the symptoms. The treatment provided no relief.      Past Medical History:  Diagnosis Date   Anemia    BV (bacterial vaginosis)    Chlamydia    Ectopic pregnancy     Patient Active Problem List   Diagnosis Date Noted   Family history of diabetes mellitus 08/05/2020   Family history of cardiac disorder 08/05/2020   Obesity 08/05/2020   History of fetal demise, not currently pregnant 07/30/2020   Iron deficiency anemia 07/30/2020    Past Surgical History:  Procedure Laterality Date   CYST EXCISION     Chest   NO PAST SURGERIES     WISDOM TOOTH EXTRACTION       OB History     Gravida  2   Para  1   Term  0   Preterm  1   AB  1   Living  0      SAB  0   IAB  0   Ectopic  1   Multiple  0   Live Births  0           Family History  Problem Relation Age of Onset   Diabetes Mother    Hypertension Mother    Healthy Father    Diabetes Sister    Heart disease Maternal Grandmother    Hypertension Maternal Grandmother    Depression Maternal Grandmother    Heart disease Maternal Grandfather    Hypertension Maternal Grandfather    Depression Maternal Grandfather    Dementia Paternal Grandmother     Social History   Tobacco Use   Smoking status: Never   Smokeless tobacco: Never  Vaping Use   Vaping Use: Never used  Substance Use Topics   Alcohol  use: Not Currently   Drug use: Yes    Types: Marijuana    Home Medications Prior to Admission medications   Medication Sig Start Date End Date Taking? Authorizing Provider  metroNIDAZOLE (FLAGYL) 500 MG tablet Take 1 tablet (500 mg total) by mouth 2 (two) times daily. 10/06/20   Gae Dry, MD  ferrous sulfate 325 (65 FE) MG tablet Take 1 tablet (325 mg total) by mouth daily. 07/22/19   Dalia Heading, CNM  metroNIDAZOLE (FLAGYL) 500 MG tablet Take 1 tablet (500 mg total) by mouth 2 (two) times daily. 3/54/56   Copland, Deirdre Evener, PA-C    Allergies    Patient has no known allergies.  Review of Systems   Review of Systems  Constitutional:  Negative for fever.  HENT:  Positive for sore throat. Negative for congestion, dental problem, facial swelling, hearing loss, mouth sores and trouble swallowing.   Genitourinary:  Positive for genital sores (?). Negative for decreased urine volume, difficulty urinating, dyspareunia, dysuria, enuresis, flank pain, frequency, hematuria, menstrual problem, pelvic pain, urgency, vaginal bleeding, vaginal discharge  and vaginal pain.   Physical Exam Updated Vital Signs BP 136/78 (BP Location: Right Arm)   Pulse (!) 104   Temp 98.2 F (36.8 C) (Oral)   Resp 18   LMP 11/03/2020 (Exact Date)   SpO2 100%   Physical Exam Exam conducted with a chaperone present.  Constitutional:      General: She is not in acute distress.    Appearance: She is not ill-appearing.  HENT:     Head: Normocephalic and atraumatic.     Nose: No congestion or rhinorrhea.     Mouth/Throat:     Mouth: Mucous membranes are moist.     Pharynx: No oropharyngeal exudate or posterior oropharyngeal erythema.     Tonsils: No tonsillar exudate or tonsillar abscesses. 0 on the right. 0 on the left.  Eyes:     Conjunctiva/sclera: Conjunctivae normal.  Genitourinary:    General: Normal vulva.     Labia:        Right: No rash, tenderness, lesion or injury.        Left: No  rash, tenderness, lesion or injury.      Comments: No obvious lesions or sores of vaginal area or anal area Neurological:     Mental Status: She is alert.    ED Results / Procedures / Treatments   Labs (all labs ordered are listed, but only abnormal results are displayed) Labs Reviewed  GROUP A STREP BY PCR    EKG None  Radiology No results found.  Procedures Procedures   Medications Ordered in ED Medications - No data to display  ED Course  I have reviewed the triage vital signs and the nursing notes.  Pertinent labs & imaging results that were available during my care of the patient were reviewed by me and considered in my medical decision making (see chart for details).    MDM Rules/Calculators/A&P                           [name] is here for evaluation of herpes.  Normal vitals.  No fever.  No herpetic lesions seen on GU exam oral exam.  Will swab for strep throat.  Overall she is very concerned for may be having herpes because she had a recent blood test that showed elevated HSV level.  To her knowledge she is never had herpes.  Overall reassurance is given.  No concern for herpes outbreak at this time but educated her about what symptoms might be like.  She has no sores and no pain and overall have low suspicion for herpes virus.  Strep swab was obtained and we will follow-up with that and send her an antibiotic if needed but have low suspicion for strep infection.  Discharged in good condition.  Understands return precautions.  She had a normal COVID test today.  Had recent STD screening that was otherwise normal.  Not concerned about any other STDs at this time.  Not having any vaginal discharge or bleeding.  No vaginal pain.  This chart was dictated using voice recognition software.  Despite best efforts to proofread,  errors can occur which can change the documentation meaning.   Final Clinical Impression(s) / ED Diagnoses Final diagnoses:  Sore throat   Concern about STD in female without diagnosis    Rx / DC Orders ED Discharge Orders     None        Lennice Sites, DO 11/16/20 2305

## 2020-11-17 ENCOUNTER — Ambulatory Visit: Payer: Medicaid Other | Admitting: Obstetrics & Gynecology

## 2020-11-17 ENCOUNTER — Encounter: Payer: Self-pay | Admitting: Obstetrics & Gynecology

## 2020-11-17 VITALS — BP 120/80 | Ht 68.0 in | Wt 269.0 lb

## 2020-11-17 DIAGNOSIS — Z113 Encounter for screening for infections with a predominantly sexual mode of transmission: Secondary | ICD-10-CM

## 2020-11-17 NOTE — Telephone Encounter (Signed)
Pt needs an appointment

## 2020-11-17 NOTE — Progress Notes (Signed)
History of Present Illness:  Alicia Terry is a 30 y.o. who recently underwent labwork for STD screening.  She presents for discussion of results and plan of management. 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  Neg IgG.  Pt has had no sx's in past.  She now worries about getting sx's or outbreak.  Says she feels funny in throat and wonders if this is hsv of throat.  Seen in clinic yesterday w swab for strep throat and covid (neg).  PMHx: She  has a past medical history of Anemia, BV (bacterial vaginosis), Chlamydia, and Ectopic pregnancy. Also,  has a past surgical history that includes No past surgeries; Wisdom tooth extraction; and Cyst excision., family history includes Dementia in her paternal grandmother; Depression in her maternal grandfather and maternal grandmother; Diabetes in her mother and sister; Healthy in her father; Heart disease in her maternal grandfather and maternal grandmother; Hypertension in her maternal grandfather, maternal grandmother, and mother.,  reports that she has never smoked. She has never used smokeless tobacco. She reports that she does not currently use alcohol. She reports current drug use. Drug: Marijuana. No outpatient medications have been marked as taking for the 11/17/20 encounter (Office Visit) with Gae Dry, MD.  . Also, has No Known Allergies..  Review of Systems  All other systems reviewed and are negative.  Physical Exam:  BP 120/80   Ht 5\' 8"  (1.727 m)   Wt 269 lb (122 kg)   LMP 11/03/2020 (Exact Date)   BMI 40.90 kg/m  Body mass index is 40.9 kg/m. Physical Exam Constitutional:      General: She is not in acute distress.    Appearance: She is well-developed.  Genitourinary:     Right Labia: No rash, tenderness or lesions.    Left Labia: No tenderness, lesions or rash.    No vaginal erythema or bleeding.      Right Adnexa: not tender and no mass present.    Left Adnexa: not tender and no mass present.    No cervical motion  tenderness, discharge, polyp or nabothian cyst.     Pelvic exam was performed with patient in the lithotomy position.  HENT:     Head: Normocephalic and atraumatic.     Nose: Nose normal.     Mouth/Throat:     Mouth: Mucous membranes are moist. No oral lesions.     Tongue: No lesions.     Palate: No mass.     Pharynx: Oropharynx is clear.     Tonsils: No tonsillar exudate or tonsillar abscesses.  Abdominal:     General: There is no distension.     Palpations: Abdomen is soft.     Tenderness: There is no abdominal tenderness.  Musculoskeletal:        General: Normal range of motion.  Neurological:     Mental Status: She is alert and oriented to person, place, and time.     Cranial Nerves: No cranial nerve deficit.  Skin:    General: Skin is warm and dry.  Psychiatric:        Attention and Perception: Attention normal.        Mood and Affect: Mood and affect normal.        Speech: Speech normal.        Behavior: Behavior normal.        Thought Content: Thought content normal.        Judgment: Judgment normal.     Assessment:  Problem List Items Addressed This Visit     Screening for STD (sexually transmitted disease)    -  Primary   Relevant Orders   HSV(herpes simplex vrs) 1+2 ab-IgG     Plan: Detailed discussion of results today. Options for management discussed. Info provided. At this time, we will plan for additional lab testing for type of HSV.  Again, as it was borderline it may be neg in follow up.  Monitor for sx's, and educated on what these sx's may be.  Condoms for birth control/ infection prevention discussed.  She was amenable to this plan and we will see her back for annual/PRN.  A total of 20 minutes were spent face-to-face with the patient as well as preparation, review, communication, and documentation during this encounter.    Barnett Applebaum, MD, [facility], [facility] 11/17/2020  4:06 PM

## 2020-11-17 NOTE — Telephone Encounter (Signed)
Patient is scheduled today at 3:50 due to cancellation in scheduled

## 2020-11-18 LAB — HSV(HERPES SIMPLEX VRS) I + II AB-IGG
HSV 1 Glycoprotein G Ab, IgG: 16.9 {index} — ABNORMAL HIGH (ref 0.00–0.90)
HSV 2 IgG, Type Spec: <0.91 {index} (ref 0.00–0.90)

## 2020-12-20 ENCOUNTER — Other Ambulatory Visit: Payer: Self-pay

## 2020-12-21 ENCOUNTER — Other Ambulatory Visit: Payer: Self-pay

## 2020-12-22 ENCOUNTER — Other Ambulatory Visit: Payer: Self-pay | Admitting: Obstetrics & Gynecology

## 2020-12-22 MED ORDER — METRONIDAZOLE 500 MG PO TABS: 500.0000 mg | ORAL_TABLET | Freq: Two times a day (BID) | ORAL | 0 refills | Status: DC

## 2021-01-15 ENCOUNTER — Encounter: Payer: Self-pay | Admitting: Obstetrics and Gynecology

## 2021-01-16 ENCOUNTER — Encounter: Payer: Self-pay | Admitting: Emergency Medicine

## 2021-01-16 ENCOUNTER — Other Ambulatory Visit: Payer: Self-pay

## 2021-01-16 ENCOUNTER — Ambulatory Visit
Admission: EM | Admit: 2021-01-16 | Discharge: 2021-01-16 | Disposition: A | Discharge status: Home / Self Care | Payer: Medicaid Other | Attending: Emergency Medicine | Admitting: Emergency Medicine

## 2021-01-16 DIAGNOSIS — N898 Other specified noninflammatory disorders of vagina: Secondary | ICD-10-CM | POA: Diagnosis not present

## 2021-01-16 DIAGNOSIS — R300 Dysuria: Secondary | ICD-10-CM | POA: Diagnosis not present

## 2021-01-16 LAB — POCT URINALYSIS DIP (MANUAL ENTRY)
Bilirubin, UA: NEGATIVE
Glucose, UA: NEGATIVE mg/dL
Nitrite, UA: NEGATIVE
Protein Ur, POC: 30 mg/dL — AB
Spec Grav, UA: >=1.03 — AB (ref 1.010–1.025)
Urobilinogen, UA: 0.2 E.U./dL
pH, UA: 5.5 (ref 5.0–8.0)

## 2021-01-16 LAB — POCT URINE PREGNANCY: Preg Test, Ur: NEGATIVE

## 2021-01-16 MED ORDER — METRONIDAZOLE 500 MG PO TABS: 500.0000 mg | ORAL_TABLET | Freq: Two times a day (BID) | ORAL | 0 refills | Status: DC

## 2021-01-16 MED ORDER — FLUCONAZOLE 150 MG PO TABS: 150.0000 mg | ORAL_TABLET | Freq: Once | ORAL | 0 refills | Status: AC

## 2021-01-16 NOTE — Discharge Instructions (Addendum)
Take metronidazole twice a day for 7 days.  Your vaginal tests are pending.  If your test results are positive, we will call you.  You and your sexual partner(s) may require treatment at that time.  Do not have sexual activity for at least 7 days.    Schedule an appointment with your gynecologist.

## 2021-01-16 NOTE — ED Triage Notes (Signed)
Pt treated her self for yeast infection 3 days ago with OTC medication. After the treatment pt states her vaginal area feels sore. She is having vaginal discharge.

## 2021-01-16 NOTE — ED Provider Notes (Signed)
Alicia Terry    CSN: [id number] Arrival date & time: 01/16/21  1134      History   Chief Complaint Chief Complaint  Patient presents with   Vaginitis    HPI Alicia Terry is a 30 y.o. female.  Patient presents with clear vaginal discharge and burning with urination x 3 days.  She also reports tenderness and swelling at the entrance of her vagina.  Treatment at home with OTC Monistat.  Denies fever, chills, abdominal pain, flank pain, hematuria, or other symptoms.  She contacted her GYN yesterday and a prescription for Diflucan was sent to the pharmacy; Patient has not taken it.  She was prescribed metronidazole last month after contacting her GYN in a MyChart message; she took 3 days of this medication but did not complete it because she lost it.  Her medical history includes obesity, bacterial vaginosis, chlamydia.     The history is provided by the patient and medical records.   Past Medical History:  Diagnosis Date   Anemia    BV (bacterial vaginosis)    Chlamydia    Ectopic pregnancy     Patient Active Problem List   Diagnosis Date Noted   Family history of diabetes mellitus 08/05/2020   Family history of cardiac disorder 08/05/2020   Obesity 08/05/2020   History of fetal demise, not currently pregnant 07/30/2020   Iron deficiency anemia 07/30/2020    Past Surgical History:  Procedure Laterality Date   CYST EXCISION     Chest   NO PAST SURGERIES     WISDOM TOOTH EXTRACTION      OB History     Gravida  2   Para  1   Term  0   Preterm  1   AB  1   Living  0      SAB  0   IAB  0   Ectopic  1   Multiple  0   Live Births  0            Home Medications    Prior to Admission medications   Medication Sig Start Date End Date Taking? Authorizing Provider  ferrous sulfate 325 (65 FE) MG tablet Take 1 tablet (325 mg total) by mouth daily. 07/22/19  Yes Dalia Heading, CNM  metroNIDAZOLE (FLAGYL) 500 MG tablet Take 1 tablet  (500 mg total) by mouth 2 (two) times daily. 01/16/21  Yes Sharion Balloon, NP  fluconazole (DIFLUCAN) 150 MG tablet Take 1 tablet (150 mg total) by mouth once for 1 dose. 01/16/21 07/31/92  Copland, Deirdre Evener, PA-C    Family History Family History  Problem Relation Age of Onset   Diabetes Mother    Hypertension Mother    Healthy Father    Diabetes Sister    Heart disease Maternal Grandmother    Hypertension Maternal Grandmother    Depression Maternal Grandmother    Heart disease Maternal Grandfather    Hypertension Maternal Grandfather    Depression Maternal Grandfather    Dementia Paternal Grandmother     Social History Social History   Tobacco Use   Smoking status: Never   Smokeless tobacco: Never  Vaping Use   Vaping Use: Never used  Substance Use Topics   Alcohol use: Not Currently   Drug use: Yes    Types: Marijuana     Allergies   Patient has no known allergies.   Review of Systems Review of Systems  Constitutional:  Negative for chills and  fever.  Gastrointestinal:  Negative for abdominal pain, constipation, diarrhea, nausea and vomiting.  Genitourinary:  Positive for dysuria, vaginal discharge and vaginal pain. Negative for flank pain, hematuria and pelvic pain.  Skin:  Negative for rash and wound.  All other systems reviewed and are negative.   Physical Exam Triage Vital Signs ED Triage Vitals  Enc Vitals Group     BP 01/16/21 1244 (!) 143/84     Pulse Rate 01/16/21 1244 93     Resp 01/16/21 1244 18     Temp 01/16/21 1244 98.1 F (36.7 C)     Temp Source 01/16/21 1244 Oral     SpO2 01/16/21 1244 98 %     Weight --      Height --      Head Circumference --      Peak Flow --      Pain Score 01/16/21 1250 0     Pain Loc --      Pain Edu? --      Excl. in [place]? --    No data found.  Updated Vital Signs BP (!) 143/84 (BP Location: Left Arm)   Pulse 93   Temp 98.1 F (36.7 C) (Oral)   Resp 18   LMP  (LMP Unknown)   SpO2 98%   Visual  Acuity Right Eye Distance:   Left Eye Distance:   Bilateral Distance:    Right Eye Near:   Left Eye Near:    Bilateral Near:     Physical Exam Vitals and nursing note reviewed. Exam conducted with a chaperone present Kristine Garbe, Therapist, sports).  Constitutional:      General: She is not in acute distress.    Appearance: She is well-developed. She is obese. She is not ill-appearing.  HENT:     Mouth/Throat:     Mouth: Mucous membranes are moist.  Cardiovascular:     Rate and Rhythm: Normal rate and regular rhythm.     Heart sounds: Normal heart sounds.  Pulmonary:     Effort: Pulmonary effort is normal. No respiratory distress.     Breath sounds: Normal breath sounds.  Abdominal:     General: Bowel sounds are normal.     Palpations: Abdomen is soft.     Tenderness: There is no abdominal tenderness. There is no right CVA tenderness, left CVA tenderness, guarding or rebound.  Genitourinary:    General: Normal vulva.     Exam position: Lithotomy position.     Vagina: Vaginal discharge and tenderness present.     Cervix: Normal.     Uterus: Normal.      Adnexa: Right adnexa normal and left adnexa normal.     Comments: Thin malodorous white-green vaginal discharge.  Musculoskeletal:     Cervical back: Neck supple.  Skin:    General: Skin is warm and dry.  Neurological:     Mental Status: She is alert.  Psychiatric:        Mood and Affect: Mood normal.        Behavior: Behavior normal.     UC Treatments / Results  Labs (all labs ordered are listed, but only abnormal results are displayed) Labs Reviewed  POCT URINALYSIS DIP (MANUAL ENTRY) - Abnormal; Notable for the following components:      Result Value   Ketones, POC UA trace (5) (*)    Spec Grav, UA >=1.030 (*)    Blood, UA trace-intact (*)    Protein Ur, POC =30 (*)  Leukocytes, UA Small (1+) (*)    All other components within normal limits  URINE CULTURE  POCT URINE PREGNANCY  CERVICOVAGINAL ANCILLARY ONLY     EKG   Radiology No results found.  Procedures Procedures (including critical care time)  Medications Ordered in UC Medications - No data to display  Initial Impression / Assessment and Plan / UC Course  I have reviewed the triage vital signs and the nursing notes.  Pertinent labs & imaging results that were available during my care of the patient were reviewed by me and considered in my medical decision making (see chart for details).   Vaginal discharge, Dysuria.  Urine pregnancy negative.  Urine culture pending.  Treating with metronidazole.  Discussed that we will call if test results are positive.  Discussed that she may require additional treatment if test results are positive.  Discussed that sexual partner(s) may also require treatment.  Instructed patient to abstain from sexual activity for at least 7 days.  Instructed her to follow-up with her GYN.  Patient agrees to plan of care.    Final Clinical Impressions(s) / UC Diagnoses   Final diagnoses:  Vaginal discharge  Dysuria     Discharge Instructions      Take metronidazole twice a day for 7 days.  Your vaginal tests are pending.  If your test results are positive, we will call you.  You and your sexual partner(s) may require treatment at that time.  Do not have sexual activity for at least 7 days.    Schedule an appointment with your gynecologist.       ED Prescriptions     Medication Sig Dispense Auth. Provider   metroNIDAZOLE (FLAGYL) 500 MG tablet Take 1 tablet (500 mg total) by mouth 2 (two) times daily. 14 tablet Sharion Balloon, NP      PDMP not reviewed this encounter.   Sharion Balloon, NP 01/16/21 [phone]

## 2021-01-17 LAB — CERVICOVAGINAL ANCILLARY ONLY
Bacterial Vaginitis (gardnerella): POSITIVE — AB
Candida Glabrata: NEGATIVE
Candida Vaginitis: POSITIVE — AB
Chlamydia: NEGATIVE
Comment: NEGATIVE
Comment: NEGATIVE
Comment: NEGATIVE
Comment: NEGATIVE
Comment: NEGATIVE
Comment: NORMAL
Neisseria Gonorrhea: NEGATIVE
Trichomonas: NEGATIVE

## 2021-01-17 LAB — URINE CULTURE: Culture: 50000 — AB

## 2021-01-18 ENCOUNTER — Telehealth (HOSPITAL_COMMUNITY): Payer: Self-pay | Admitting: Emergency Medicine

## 2021-01-18 MED ORDER — FLUCONAZOLE 150 MG PO TABS: 150.0000 mg | ORAL_TABLET | Freq: Once | ORAL | 0 refills | Status: AC

## 2021-01-18 MED ORDER — CEPHALEXIN 500 MG PO CAPS: 500.0000 mg | ORAL_CAPSULE | Freq: Two times a day (BID) | ORAL | 0 refills | Status: AC

## 2021-01-18 NOTE — Telephone Encounter (Signed)
Covering urine culture results, per Bess Harvest, APP

## 2021-01-20 ENCOUNTER — Encounter: Payer: Self-pay | Admitting: Obstetrics and Gynecology

## 2021-01-20 NOTE — Telephone Encounter (Signed)
Your patient question

## 2021-02-07 ENCOUNTER — Other Ambulatory Visit: Payer: Self-pay

## 2021-02-07 ENCOUNTER — Ambulatory Visit (INDEPENDENT_AMBULATORY_CARE_PROVIDER_SITE_OTHER): Payer: Medicaid Other | Admitting: Obstetrics and Gynecology

## 2021-02-07 ENCOUNTER — Encounter: Payer: Self-pay | Admitting: Obstetrics and Gynecology

## 2021-02-07 VITALS — BP 120/70 | Ht 68.0 in | Wt 271.0 lb

## 2021-02-07 DIAGNOSIS — Z01419 Encounter for gynecological examination (general) (routine) without abnormal findings: Secondary | ICD-10-CM

## 2021-02-07 DIAGNOSIS — B001 Herpesviral vesicular dermatitis: Secondary | ICD-10-CM | POA: Diagnosis not present

## 2021-02-07 DIAGNOSIS — R399 Unspecified symptoms and signs involving the genitourinary system: Secondary | ICD-10-CM

## 2021-02-07 LAB — POCT URINALYSIS DIPSTICK
Bilirubin, UA: NEGATIVE
Glucose, UA: NEGATIVE
Ketones, UA: NEGATIVE
Leukocytes, UA: NEGATIVE
Nitrite, UA: NEGATIVE
Protein, UA: NEGATIVE
Spec Grav, UA: 1.025 (ref 1.010–1.025)
pH, UA: 5 (ref 5.0–8.0)

## 2021-02-07 MED ORDER — VALACYCLOVIR HCL 1 G PO TABS: ORAL_TABLET | ORAL | 1 refills | Status: DC

## 2021-02-07 NOTE — Progress Notes (Signed)
PCP:  Venita Lick, NP   Chief Complaint  Patient presents with   Gynecologic Exam    No concerns     HPI:      [name] is a 31 y.o. G2P0110 whose LMP was Patient's last menstrual period was 01/31/2021 (exact date)., presents today for her annual examination.  Her menses are regular every 28-30 days, lasting 5 days ,mod flow, no clots.  Dysmenorrhea none. She does not have intermenstrual bleeding usually but did last cycle. Diagnosed with BV, yeast and UTI and did tx 12/22 at Urgent Care. Had neg STD testing.  Sex activity: single partner, contraception - none. Declines BC. Conception ok. Taking MVI and Fe supp Last Pap: 10/04/20 Results were: no abnormalities /neg HPV DNA  Hx of STDs: chlamydia in past.  Hx of BV and yeast on culture 12/22 with flagyl and diflucan tx. Also treated for strep on urine C&S. Due for urine recheck. No current UTI sx.   There is no FH of breast cancer. There is no FH of ovarian cancer. The patient does do self-breast exams.  Tobacco use: The patient denies current or previous tobacco use. Alcohol use: none Daily marijuana use.  Exercise: not active  She does get adequate calcium but not Vitamin D in her diet. Hx of IDA, taking Fe supp. Normal CBC, iron, ferritin 7/22.  Patient Active Problem List   Diagnosis Date Noted   Family history of diabetes mellitus 08/05/2020   Family history of cardiac disorder 08/05/2020   Obesity 08/05/2020   History of fetal demise, not currently pregnant 07/30/2020   Iron deficiency anemia 07/30/2020    Past Surgical History:  Procedure Laterality Date   CYST EXCISION     Chest   WISDOM TOOTH EXTRACTION      Family History  Problem Relation Age of Onset   Diabetes Mother    Hypertension Mother    Healthy Father    Diabetes Sister    Heart disease Maternal Grandmother    Hypertension Maternal Grandmother    Depression Maternal Grandmother    Heart disease Maternal Grandfather     Hypertension Maternal Grandfather    Depression Maternal Grandfather    Dementia Paternal Grandmother     Social History   Socioeconomic History   Marital status: Single    Spouse name: Not on file   Number of children: Not on file   Years of education: Not on file   Highest education level: Not on file  Occupational History   Occupation: Beautician  Tobacco Use   Smoking status: Never   Smokeless tobacco: Never  Vaping Use   Vaping Use: Never used  Substance and Sexual Activity   Alcohol use: Not Currently   Drug use: Yes    Types: Marijuana   Sexual activity: Yes    Birth control/protection: None  Other Topics Concern   Not on file  Social History Narrative   Not on file   Social Determinants of Health   Financial Resource Strain: Low Risk    Difficulty of Paying Living Expenses: Not hard at all  Food Insecurity: No Food Insecurity   Worried About Charity fundraiser in the Last Year: Never true   Ran Out of Food in the Last Year: Never true  Transportation Needs: No Transportation Needs   Lack of Transportation (Medical): No   Lack of Transportation (Non-Medical): No  Physical Activity: Sufficiently Active   Days of Exercise per Week: 7 days  Minutes of Exercise per Session: 30 min  Stress: No Stress Concern Present   Feeling of Stress : Only a little  Social Connections: Socially Isolated   Frequency of Communication with Friends and Family: Twice a week   Frequency of Social Gatherings with Friends and Family: Twice a week   Attends Religious Services: Never   Marine scientist or Organizations: No   Attends Music therapist: Never   Marital Status: Never married  Human resources officer Violence: Not At Risk   Fear of Current or Ex-Partner: No   Emotionally Abused: No   Physically Abused: No   Sexually Abused: No     Current Outpatient Medications:    ferrous sulfate 325 (65 FE) MG tablet, Take 1 tablet (325 mg total) by mouth daily.,  Disp: , Rfl: 3   valACYclovir (VALTREX) 1000 MG tablet, Take 2 tabs BID x 1 day prn sx, Disp: 30 tablet, Rfl: 1     ROS:  Review of Systems  Constitutional:  Negative for fatigue, fever and unexpected weight change.  Respiratory:  Negative for cough, shortness of breath and wheezing.   Cardiovascular:  Negative for chest pain, palpitations and leg swelling.  Gastrointestinal:  Negative for blood in stool, constipation, diarrhea, nausea and vomiting.  Endocrine: Negative for cold intolerance, heat intolerance and polyuria.  Genitourinary:  Negative for dyspareunia, dysuria, flank pain, frequency, genital sores, hematuria, menstrual problem, pelvic pain, urgency, vaginal bleeding, vaginal discharge and vaginal pain.  Musculoskeletal:  Negative for back pain, joint swelling and myalgias.  Skin:  Negative for rash.  Neurological:  Negative for dizziness, syncope, light-headedness, numbness and headaches.  Hematological:  Negative for adenopathy.  Psychiatric/Behavioral:  Negative for agitation, confusion, sleep disturbance and suicidal ideas. The patient is not nervous/anxious.   BREAST: No symptoms   Objective: BP 120/70    Ht 5\' 8"  (1.727 m)    Wt 271 lb (122.9 kg)    LMP 01/31/2021 (Exact Date)    BMI 41.21 kg/m    Physical Exam Constitutional:      Appearance: She is well-developed.  Genitourinary:     Vulva normal.     Right Labia: No rash, tenderness or lesions.    Left Labia: No tenderness, lesions or rash.    No vaginal discharge, erythema, tenderness or bleeding.      Right Adnexa: not tender and no mass present.    Left Adnexa: not tender and no mass present.    No cervical friability or polyp.     Uterus is not enlarged or tender.  Breasts:    Right: No mass, nipple discharge, skin change or tenderness.     Left: No mass, nipple discharge, skin change or tenderness.  Neck:     Thyroid: No thyromegaly.  Cardiovascular:     Rate and Rhythm: Normal rate and regular  rhythm.     Heart sounds: Normal heart sounds. No murmur heard. Pulmonary:     Effort: Pulmonary effort is normal.     Breath sounds: Normal breath sounds.  Abdominal:     Palpations: Abdomen is soft.     Tenderness: There is no abdominal tenderness. There is no guarding or rebound.  Musculoskeletal:        General: Normal range of motion.     Cervical back: Normal range of motion.  Lymphadenopathy:     Cervical: No cervical adenopathy.  Neurological:     General: No focal deficit present.     Mental Status: She  is alert and oriented to person, place, and time.     Cranial Nerves: No cranial nerve deficit.  Skin:    General: Skin is warm and dry.  Psychiatric:        Mood and Affect: Mood normal.        Behavior: Behavior normal.        Thought Content: Thought content normal.        Judgment: Judgment normal.  Vitals reviewed.    Results: Results for orders placed or performed in visit on 02/07/21 (from the past 24 hour(s))  POCT Urinalysis Dipstick     Status: Abnormal   Collection Time: 02/07/21  3:22 PM  Result Value Ref Range   Color, UA amber    Clarity, UA clear    Glucose, UA Negative Negative   Bilirubin, UA neg    Ketones, UA neg    Spec Grav, UA 1.025 1.010 - 1.025   Blood, UA mod    pH, UA 5.0 5.0 - 8.0   Protein, UA Negative Negative   Urobilinogen, UA     Nitrite, UA neg    Leukocytes, UA Negative Negative   Appearance     Odor     NO VAG BLEEDING ON EXAM BUT FINISHED PERIOD YESTERDAY  Assessment/Plan: Encounter for annual routine gynecological examination  Cold sore - Plan: valACYclovir (VALTREX) 1000 MG tablet; Rx valtrex. F/u prn.   UTI symptoms - Plan: POCT Urinalysis Dipstick, Urine Culture; no sx today but UA f/u today with mod blood. Check C&S. If neg, could be residual RBCs from menses although exam neg.   Meds ordered this encounter  Medications   valACYclovir (VALTREX) 1000 MG tablet    Sig: Take 2 tabs BID x 1 day prn sx     Dispense:  30 tablet    Refill:  1    Order Specific Question:   Supervising Provider    Answer:   Gae Dry [[id number]]             GYN counsel adequate intake of calcium and vitamin D, diet and exercise     F/U  Return in about 1 year (around 02/07/2022).  Gillis Boardley B. Immanuel Fedak, PA-C 02/07/2021 3:23 PM

## 2021-02-07 NOTE — Patient Instructions (Signed)
I value your feedback and you entrusting us with your care. If you get a [facility] patient survey, I would appreciate you taking the time to let us know about your experience today. Thank you! ? ? ?

## 2021-02-09 ENCOUNTER — Other Ambulatory Visit: Payer: Self-pay | Admitting: Obstetrics and Gynecology

## 2021-02-09 LAB — URINE CULTURE

## 2021-02-09 MED ORDER — AMOXICILLIN 500 MG PO CAPS: 500.0000 mg | ORAL_CAPSULE | Freq: Two times a day (BID) | ORAL | 0 refills | Status: AC

## 2021-02-09 NOTE — Addendum Note (Signed)
Addended by: Ardeth Perfect B on: 0/07/8932 12:08 PM   Modules accepted: Orders

## 2021-02-09 NOTE — Addendum Note (Signed)
Addended by: Ardeth Perfect B on: 09/10/1681 12:07 PM   Modules accepted: Orders

## 2021-02-15 ENCOUNTER — Ambulatory Visit: Payer: Medicaid Other | Admitting: Nurse Practitioner

## 2021-02-23 ENCOUNTER — Other Ambulatory Visit: Payer: Self-pay | Admitting: Obstetrics and Gynecology

## 2021-02-23 ENCOUNTER — Encounter: Payer: Self-pay | Admitting: Obstetrics and Gynecology

## 2021-02-23 DIAGNOSIS — B9689 Other specified bacterial agents as the cause of diseases classified elsewhere: Secondary | ICD-10-CM

## 2021-02-23 DIAGNOSIS — N760 Acute vaginitis: Secondary | ICD-10-CM

## 2021-02-23 MED ORDER — CLINDAMYCIN HCL 300 MG PO CAPS: 300.0000 mg | ORAL_CAPSULE | Freq: Two times a day (BID) | ORAL | 0 refills | Status: AC

## 2021-02-23 NOTE — Progress Notes (Signed)
Rx clindamycin for recurrent BV. Did flagyl 11/22 and 12/22

## 2021-04-25 ENCOUNTER — Encounter: Payer: Self-pay | Admitting: Obstetrics and Gynecology

## 2021-04-26 ENCOUNTER — Other Ambulatory Visit: Payer: Self-pay | Admitting: Obstetrics and Gynecology

## 2021-04-26 DIAGNOSIS — B9689 Other specified bacterial agents as the cause of diseases classified elsewhere: Secondary | ICD-10-CM

## 2021-04-26 MED ORDER — CLINDAMYCIN HCL 300 MG PO CAPS: 300.0000 mg | ORAL_CAPSULE | Freq: Two times a day (BID) | ORAL | 0 refills | Status: AC

## 2021-04-26 NOTE — Progress Notes (Signed)
Rx RF clindamycin for recurrent BV sx ?

## 2021-05-29 ENCOUNTER — Other Ambulatory Visit: Payer: Self-pay | Admitting: Obstetrics and Gynecology

## 2021-05-29 ENCOUNTER — Encounter: Payer: Self-pay | Admitting: Obstetrics and Gynecology

## 2021-05-29 MED ORDER — METRONIDAZOLE 500 MG PO TABS: 500.0000 mg | ORAL_TABLET | Freq: Two times a day (BID) | ORAL | 0 refills | Status: DC

## 2021-05-29 NOTE — Progress Notes (Signed)
Rx flagyl for BV ?

## 2021-05-30 ENCOUNTER — Other Ambulatory Visit: Payer: Self-pay | Admitting: Obstetrics and Gynecology

## 2021-05-30 MED ORDER — METRONIDAZOLE 500 MG PO TABS: 500.0000 mg | ORAL_TABLET | Freq: Two times a day (BID) | ORAL | 0 refills | Status: AC

## 2021-06-19 ENCOUNTER — Encounter: Payer: Self-pay | Admitting: *Deleted

## 2021-06-26 ENCOUNTER — Ambulatory Visit: Payer: Medicaid Other

## 2021-07-11 ENCOUNTER — Telehealth (INDEPENDENT_AMBULATORY_CARE_PROVIDER_SITE_OTHER): Payer: Medicaid Other

## 2021-07-11 DIAGNOSIS — O0990 Supervision of high risk pregnancy, unspecified, unspecified trimester: Secondary | ICD-10-CM | POA: Insufficient documentation

## 2021-07-11 DIAGNOSIS — Z3A00 Weeks of gestation of pregnancy not specified: Secondary | ICD-10-CM

## 2021-07-11 DIAGNOSIS — O09219 Supervision of pregnancy with history of pre-term labor, unspecified trimester: Secondary | ICD-10-CM

## 2021-07-11 NOTE — Progress Notes (Signed)
New OB Intake  I connected with  Alicia Terry on 07/11/21 at  9:15 AM EDT by MyChart Video Visit and verified that I am speaking with the correct person using two identifiers. Nurse is located at [facility] and pt is located at [facility].  I discussed the limitations, risks, security and privacy concerns of performing an evaluation and management service by telephone and the availability of in person appointments. I also discussed with the patient that there may be a patient responsible charge related to this service. The patient expressed understanding and agreed to proceed.  I explained I am completing New OB Intake today. We discussed her EDD of 02/13/22 that is based on LMP of 05/09/21. Pt is G3/P0. I reviewed her allergies, medications, Medical/Surgical/OB history, and appropriate screenings. I informed her of [facility] services. Based on history, this is a/an  pregnancy complicated by preterm labor  .   Patient Active Problem List   Diagnosis Date Noted   Family history of diabetes mellitus 08/05/2020   Family history of cardiac disorder 08/05/2020   Obesity 08/05/2020   History of fetal demise, not currently pregnant 07/30/2020   Iron deficiency anemia 07/30/2020    Concerns addressed today  Delivery Plans:  Plans to deliver at [facility].   MyChart/Babyscripts MyChart access verified. I explained pt will have some visits in office and some virtually. Babyscripts instructions given and order placed. Patient verifies receipt of registration text/e-mail. Account successfully created and app downloaded.  Blood Pressure Cuff  Pt has own BP Cuff. Explained after first prenatal appt pt will check weekly and document in 51.  Weight scale: Patient does / does not  have weight scale. Weight scale ordered for patient to pick up from [employer].   Anatomy US Explained first scheduled [place] will be around 19 weeks. Anatomy US scheduled for 09/19/21 at 1030. Pt notified to arrive at  1015. Scheduled AFP lab only appointment if CenteringPregnancy pt for same day as anatomy US.   Labs Discussed Johnsie Cancel genetic screening with patient. Would like both Panorama and Horizon drawn at new OB visit.Also if interested in genetic testing, tell patient she will need AFP 15-21 weeks to complete genetic testing .Routine prenatal labs needed.  Covid Vaccine Patient has not covid vaccine.   Is patient a CenteringPregnancy candidate?  Not a Candidate Declined due to NA Not a candidate due to OtherPreterm/ ectopic/iufd Centering Patient" indicated on sticky note   Is patient a Mom+Baby Combined Care candidate?  Not a candidate    Scheduled with Mom+Baby provider    Is patient interested in [place]?  No   "Interested in [employer] - Schedule next visit with CNM" on sticky note  Informed patient of [facility] website  and placed link in her AVS.   Social Determinants of Health Food Insecurity: Patient denies food insecurity. WIC Referral: Patient is interested in referral to [facility].  Transportation: Patient denies transportation needs. Childcare: Discussed no children allowed at ultrasound appointments. Offered childcare services; patient declines childcare services at this time.  Send link to Pregnancy Navigators   Placed OB Box on problem list and updated  First visit review I reviewed new OB appt with pt. I explained she will have a pelvic exam, ob bloodwork with genetic screening, and PAP smear. Explained pt will be seen by Dr. Harolyn Rutherford at first visit; encounter routed to appropriate provider. Explained that patient will be seen by pregnancy navigator following visit with provider. [facility] information placed in AVS.  Bethanne Ginger, [place] 07/11/2021  9:16 AM

## 2021-07-11 NOTE — Patient Instructions (Signed)
AREA PEDIATRIC/FAMILY PRACTICE PHYSICIANS  [place] ([address]) [facility] Chambliss, MD; Eniola, MD; Hale, MD; Hensel, MD; McDiarmid, MD; McIntyer, MD; Abdulhadi Stopa, MD; Walden, MD [address] [place], [place] [address] [phone] Mon-Fri 8:30-12:30, 1:30-5:00 Providers come to see babies at [facility] Accepting Medicaid [facility] providers who accept newborns: Koirala, MD; Morrow, MD; Wolters, MD [address], [place], [place] [address] [phone] Mon-Fri 8:00-5:30 Babies seen by providers at [facility] Does NOT accept Medicaid Please call early in hospitalization for appointment (limited availability)  [facility] Mulberry, MD [address] [place], [place] [address] [phone] Mon, Tue, Thur, Fri 8:30-5:00, Wed 10:00-7:00 (closed 1-2pm) Babies seen by [facility] providers Accepting Medicaid Rubin - Pediatrician Rubin, MD [address], [place], [place] [address] [phone] Mon-Fri 8:30-5:00, Sat 8:30-12:00 Provider comes to see babies at [facility] Accepting Medicaid Must have been referred from current patients or contacted office prior to delivery [facility] ([facility]) Brown, MD; Chandler, MD; Ettefagh, MD; Grant, MD; Lester, MD; McCormick, MD; McQueen, MD; Prose, MD; Simha, MD; Stanley, MD; Stryffeler, NP; Tebben, NP [address], [place], [place] [address] [phone] Mon, Tue, Thur, Fri 8:30-5:30, Wed 9:30-5:30, Sat 8:30-12:30 Babies seen by [facility] providers Accepting Medicaid Only accepting infants of first-time parents or siblings of current patients Hospital discharge coordinator will make follow-up appointment Jack Amos [address], [place], [place]  [address] [phone]   Fax - [phone] [facility] [address], [place], [place]  [address] Phone - [phone]   Fax - [phone] Shilpa Gosrani [address], [place], [place]  [address] [phone]  [place] ([address]) [facility] Bates, MD; Brassfield, MD; Cooper, Cox, MD; MD; Davis, MD; Dovico, MD; Ettefaugh, MD; Little, MD; Lowe, MD; Keiffer, MD; Melvin, MD; Sumner, MD; Williams, MD [address], [place], [place] [address] [phone] Mon-Fri 8:30-5:00 (extended evenings Mon-Thur as needed), Sat-Sun 10:00-1:00 Providers come to see babies at [facility] Accepting Medicaid for families of first-time babies and families with all children in the household age 3 and under. Must register with office prior to making appointment (M-F only). [facility] Henson, NP; Knapp, MD; Lalonde, MD; Tysinger, PA [address] [place], [place] [address] [phone] Mon-Fri 8:00-5:00 Babies seen by providers at [facility] Does NOT accept Medicaid/Commercial Insurance Only [facility] ([facility])  Artis, MD; Barnes, MD; Bratton, MD; Coccaro, MD; Lockett Gardner, MD; Kramer, MD; Marshall, MD; Netherton, MD; Poleto, MD; Skinner, MD [address] [place], [place] [address] [phone] Mon-Fri 8:30-5:30, Sat (Oct.-Mar.) 9:00-1:00 Babies seen by providers at [facility] Accepting Medicaid  [place] ([address]) [facility] Reid, MD; Warner, MD [address], [place], [place] [address] [phone] Mon-Fri 8:30-5:00, Sat 8:30-12:00 Providers come to see babies at [facility] Does NOT accept Medicaid [facility] Becker, PA; Hagler, MD; Scifres, PA; Sun, MD; Swayne, MD [address], [place],  [address] [phone] Mon-Fri 8:00-5:00 Babies seen by providers at [facility] Does NOT accept Medicaid Only accepting babies of parents who  are patients Please call early in hospitalization for appointment (limited availability)  Pediatricians Clark, MD; Frye, MD; Kelleher, MD; Mack, NP; Miller, MD; O'Keller, MD; Patterson, NP; Pudlo, MD; Puzio, MD; Thomas, MD; Tucker, MD; Twiselton, MD [address], [place], [place] [address] [phone] Mon-Fri 8:00-5:00, Sat 9:00-12:00 Providers come to see babies at [facility] Does NOT accept Medicaid  [place] ([address]) [facility] providers accepting new patients: Brake, NP; Wharton, PA [address], [place], [place] [address] [phone] Mon-Fri 8:00-5:00 Babies seen by providers at [facility] Does NOT accept Medicaid Only accepting babies of parents who are patients Please call early in hospitalization for appointment (limited availability) [facility] Gay, MD; Quinlan, MD [address] [place], [place] [address] [phone] (press 1 to schedule appointment) Mon-Fri 8:00-5:00 Providers come to see babies at [facility] Does NOT accept Medicaid [facility] Mazer, MD [address] [place], [place] [address] [phone] Mon-Fri 8:30-5:00 (lunch 12:30-1:00), extended hours by appointment only Wed 5:00-6:30 Babies seen by [facility] providers Accepting Medicaid [facility] Banks, MD; Jordan, MD; Koberlein, MD [address], [place], [place] [address] [phone] Mon-Fri 8:00-5:00 Babies seen by [facility] providers Does NOT accept Medicaid [facility] Parker, MD; Hunter, MD; Wallace, DO [address] [place], [place] [address] [phone] Mon-Fri 8:00-5:00 Babies seen by [facility] providers Does NOT accept Medicaid [facility] Brandon, PA; Brecken, PA; Christy, NP; Dees, MD; DeClaire, MD; DeWeese, MD; Hansen, NP; Mills, NP; Parrish, NP; Smoot, NP; Summer, MD; Vapne,  MD [address] [place], [place] [address] [phone] Mon-Fri 8:30-5:00, Sat 10:00-1:00 Providers come to see babies at [facility] Does NOT accept Medicaid Free prenatal information session Tuesdays at 4:45pm [facility] Bouska, MD; Gordon, PA; Jeffery, PA; Weber, PA [address] [place] [address] [phone] Mon-Fri 7:30-5:30 Babies seen by [facility] providers [facility] [address], [place], [place]  [address] [phone]   Fax - [phone]  [place] ([address] & [address]) [facility] [place] [address] [place], [place] [address] [phone] Mon-Thur 8:00-6:00 Providers come to see babies at [facility] Accepting Medicaid [facility] Anderson, NP; Badger, MD; Beal, PA; Spencer, PA [address] [place], [place] [address] [phone] Mon-Thur 7:30-7:30, Fri 7:30-4:30 Babies seen by [facility] providers Accepting Medicaid [facility] Agbuya, MD; Klett, NP; Romgoolam, MD [address], [place], [place] [address] [phone] Mon-Fri 8:30-5:00, Sat 8:30-12:00 Providers come to see babies at [facility] Accepting Medicaid Must have "Meet & Greet" appointment at office prior to delivery [facility] McCord, MD; Wallace, MD; Wood, MD [address], [place], [place] [address] [phone] Mon-Wed 8:00-6:00, Thur-Fri 8:00-5:00, Sat 9:00-12:00 Providers come to see babies at [facility] Does NOT accept Medicaid Only accepting siblings of current patients [facility]  [address], [place], [place]  [address] [phone]   Fax - [phone] [facility] [address], [place], [place]  [address] [phone]   Fax -  [phone]  [place] ([address] & [address]) [facility] Cirigliano, DO; Matthews, DO [address] [place], [place] [address] [phone] Mon-Fri 7:00-5:00 Babies seen by [facility] providers Does NOT accept Medicaid [facility] Briscoe, MD; Howley, PA; Moreira, PA [address], [place], [place] [address] [phone] Mon-Fri 8:00-5:00 Babies seen by [facility] providers Accepting Medicaid [facility] Boyd, MD; Church, PA; Jones, NP; Osborn, PA [address], , [place] [address] [phone] Mon-Fri 8:00-5:00 Babies seen by providers at [facility] Accepting Medicaid  [place] ([address]) [facility] [place] Wendling, DO [address] [place], [place] [address] [phone] Mon-Fri 8:00-5:00 Babies seen by [facility] providers Does NOT accept Medicaid Limited availability, please call early in hospitalization to schedule follow-up [facility] Calderon, PA; Cummings, MD; Dillard, MD; Martin, PA; Olson, MD; VanDeven, PA [address], [place], [place] [address] [phone] Mon-Fri 8:30-5:00, Sat 9:00-12:00 Babies seen by providers at [facility] Accepting Medicaid Please register online then schedule online or call office [email or web address] Hunter, NP; Kumar, MD; Martin Rogers, PA [address], [place], [place] [address] [phone] Mon-Fri 8:00-5:00 Babies seen by providers at [facility] Accepting Medicaid [facility] ([facility]) [place], MD; Kristi Fleenor, NP; West, MD [address], [place], [place] [address] [phone] Mon-Fri 8:00-5:30, Sat&Sun by appointment (phones open at  8:30) Babies seen by [facility] providers Accepting Medicaid Must be a first-time baby or sibling of current patient [facility] [place]  [address], [place], [place]  [address] [phone]   Fax - [phone]  [place] ([address]) [facility] Brown, PA; Cowen, PA; Rice, MD; Helton, PA; Spry, MD [address] [place], [place] [address] [phone] Mon-Thur 8:00-7:00, Fri 8:00-5:00, Sat 8:00-12:00, Sun 9:00-12:00 Babies seen by [facility] providers Accepting Medicaid [facility] Coe-Goins, MD; Marshall, MD; Pierre-Louis, MD [address], [place], [place] [address] [phone] Mon-Thur 8:00-5:00 Babies seen by providers at [facility] Accepting Medicaid [facility] Bratton, MD; Coe-Goins, MD; Hayes, MD; Lewis, MD; List, MD; Lott, MD; Marshall, MD; Moran, MD; O'Bel[place] Zwahlen, MD; Pierre-Louis, MD; Pitonzo, MD; Scholer, MD; Spangle, MD [address] [place], [place] [address] [phone] Mon-Fri 8:00-5:30, Sat (Oct.-Mar.) 9:00-1:00 Babies seen by providers at [facility] Accepting Medicaid Must fill out new patient packet, available online at [email or web address] [facility] ([facility]) Friddle, NP; Harris, NP; Kelly, NP; Logan, MD; Melvin, PA; Poth, MD; Ramadoss, MD; Stanton, NP [address], [place], [place] [address] [phone] Mon-Thur 8:00-5:30, Fri 8:00-5:00 Babies seen by providers at [facility] Accepting Medicaid  [place] ([address]) [facility] Dixon, PA; [place], MD; Pickard, MD; Tapia, PA [address], [place], [place] [address] [phone] Mon-Fri 8:00-5:00 Babies seen by providers at [facility] Accepting Medicaid   [place] ([address]) [facility] [place] Masneri, DO; Meyers, MD; [place], PA [address], [place], [place] [address] [phone] Mon-Fri 8:00-5:00 Babies seen by providers at [facility] Does NOT accept Medicaid Limited appointment availability, please call early in hospitalization  [facility] [place] Kunedd, DO; McGowen, MD [address], [place], [place] [address] [phone] Mon-Fri 8:00-5:00 Babies seen by [facility] providers Does NOT accept Medicaid [facility] [place] Cameron, MD; MacDonald, MD; Michaels, PA; Nayak, MD [address] [place] [address], [place], [place] [address] [phone] Mon-Fri 8:00-5:00 After hours clinic ([address] , [place] [address]) [phone] Mon-Fri 5:00-8:00, Sat 12:00-6:00, Sun 10:00-4:00 Babies seen by [facility] providers Accepting Medicaid [facility] [place] [address], [place], [place]  [address] [phone]   Fax - [phone]  [place] ([address]) [facility] [place] [facility] Andy, MD [address], [place], [place] [address] [phone] Mon-Fri 8:00-5:00 Babies seen by [facility] providers Does NOT accept Medicaid [facility] [place] ([facility] [place]) Eksir, MD [address], [place], [place] [address] [phone] Mon-Thur 8:00-7:00, Fri 8:00-5:00, Sat 8:00-12:00 Babies seen by providers at [facility] Accepting Medicaid - but does not have vaccinations in office (must be received elsewhere) Limited availability, please call early in hospitalization  [place] ([address]) [facility]  Charlene Flemming, MD [address], [place] [address] [phone]  Fax [phone]  [facility]  Kimberly [place], MD, Annamarie Streilein, PA, Carla Hampton, PA [address] [place], [place]  [address] [phone] [facility]  [address], [place], [place] [address] [phone] [address], [place], [place] [address] [phone] (West Office)  [facility] [address], [place], [place] [address] [phone] [facility] [address] [place], [place], [place] [address] [phone] [facility] [address], [place], [place] [address] [phone] [facility] [address], [place], [place] [address] [phone] [facility] [address], [place], [place] [address] [phone] [facility] [address], [place], [place] [address] [phone] [facility]  [address], [place], [place] [address] [phone] Dr. [facility] [address] [place] [address], , [place] [address] [phone] [facility] [address], [place], [place] [address] [phone] [facility] [address], , [place] [address] [phone]  Safe Medications in Pregnancy   Acne:  Benzoyl Peroxide  Salicylic Acid   Backache/Headache:  Tylenol: 2 regular strength every 4 hours OR               2 Extra strength every 6 hours   Colds/Coughs/Allergies:  Benadryl (alcohol free) 25 mg every 6 hours as needed  Breath right strips  Claritin  Cepacol throat lozenges  Chloraseptic throat spray  Cold-Eeze- up to three times per day  Cough drops, alcohol free  Flonase (by prescription only)  Guaifenesin  Mucinex  Robitussin DM (plain only, alcohol free)  Saline nasal spray/drops  Sudafed (pseudoephedrine) & Actifed * use only after [redacted] weeks gestation and if you do not have high blood pressure  Tylenol  Vicks Vaporub  Zinc lozenges  Zyrtec   Constipation:  Colace  Ducolax suppositories  Fleet enema  Glycerin suppositories  Metamucil  Milk of magnesia  Miralax  Senokot  Smooth move tea   Diarrhea:  Kaopectate  Imodium A-D    *NO pepto Bismol   Hemorrhoids:  Anusol  Anusol HC  Preparation H  Tucks   Indigestion:  Tums  Maalox  Mylanta  Zantac  Pepcid   Insomnia:  Benadryl (alcohol free) 25mg every 6 hours as needed  Tylenol PM  Unisom, no Gelcaps   Leg Cramps:  Tums  MagGel   Nausea/Vomiting:  Bonine  Dramamine  Emetrol  Ginger extract  Sea bands  Meclizine  Nausea medication to take during pregnancy:  Unisom (doxylamine succinate 25 mg tablets) Take one tablet daily at bedtime. If symptoms are not adequately controlled, the dose can be increased to a maximum recommended dose of two tablets daily (1/2 tablet in the morning, 1/2 tablet mid-afternoon and one at bedtime).  Vitamin B6 100mg tablets. Take one tablet twice a day (up   to 200 mg per day).   Skin Rashes:  Aveeno products  Benadryl cream or 25mg every 6 hours as needed  Calamine Lotion  1% cortisone cream   Yeast infection:  Gyne-lotrimin 7  Monistat 7    **If taking multiple medications, please check labels to avoid duplicating the same active ingredients  **take medication as directed on the label  ** Do not exceed 4000 mg of tylenol in 24 hours  **Do not take medications that contain aspirin or ibuprofen           

## 2021-07-27 ENCOUNTER — Other Ambulatory Visit: Payer: Self-pay

## 2021-07-27 ENCOUNTER — Encounter: Payer: Self-pay | Admitting: Obstetrics & Gynecology

## 2021-07-27 ENCOUNTER — Ambulatory Visit (INDEPENDENT_AMBULATORY_CARE_PROVIDER_SITE_OTHER): Payer: Medicaid Other | Admitting: Obstetrics & Gynecology

## 2021-07-27 DIAGNOSIS — Z8759 Personal history of other complications of pregnancy, childbirth and the puerperium: Secondary | ICD-10-CM

## 2021-07-27 DIAGNOSIS — Z3A11 11 weeks gestation of pregnancy: Secondary | ICD-10-CM

## 2021-07-27 DIAGNOSIS — O0990 Supervision of high risk pregnancy, unspecified, unspecified trimester: Secondary | ICD-10-CM

## 2021-07-27 DIAGNOSIS — O99210 Obesity complicating pregnancy, unspecified trimester: Secondary | ICD-10-CM

## 2021-07-27 MED ORDER — ASPIRIN 81 MG PO TBEC: 81.0000 mg | DELAYED_RELEASE_TABLET | Freq: Every day | ORAL | 2 refills | Status: DC

## 2021-07-27 NOTE — Addendum Note (Signed)
Addended by: Mena Goes on: 07/27/2021 05:09 PM   Modules accepted: Orders

## 2021-07-28 LAB — COMPREHENSIVE METABOLIC PANEL
ALT: 35 IU/L — ABNORMAL HIGH (ref 0–32)
AST: 17 IU/L (ref 0–40)
Albumin/Globulin Ratio: 1.9 (ref 1.2–2.2)
Albumin: 4.4 g/dL (ref 3.8–4.8)
Alkaline Phosphatase: 41 IU/L — ABNORMAL LOW (ref 44–121)
BUN/Creatinine Ratio: 13 (ref 9–23)
BUN: 9 mg/dL (ref 6–20)
Bilirubin Total: <0.2 mg/dL (ref 0.0–1.2)
CO2: 19 mmol/L — ABNORMAL LOW (ref 20–29)
Calcium: 9.9 mg/dL (ref 8.7–10.2)
Chloride: 101 mmol/L (ref 96–106)
Creatinine, Ser: 0.68 mg/dL (ref 0.57–1.00)
Globulin, Total: 2.3 g/dL (ref 1.5–4.5)
Glucose: 89 mg/dL (ref 70–99)
Potassium: 4.2 mmol/L (ref 3.5–5.2)
Sodium: 135 mmol/L (ref 134–144)
Total Protein: 6.7 g/dL (ref 6.0–8.5)
eGFR: 119 mL/min/{1.73_m2} (ref >59)

## 2021-07-28 LAB — CBC/D/PLT+RPR+RH+ABO+RUBIGG...
Antibody Screen: NEGATIVE
Basophils Absolute: 0 10*3/uL (ref 0.0–0.2)
Basos: 0 %
EOS (ABSOLUTE): 0.1 10*3/uL (ref 0.0–0.4)
Eos: 1 %
HCV Ab: NONREACTIVE
HIV Screen 4th Generation wRfx: NONREACTIVE
Hematocrit: 36.9 % (ref 34.0–46.6)
Hemoglobin: 12.4 g/dL (ref 11.1–15.9)
Hepatitis B Surface Ag: NEGATIVE
Immature Grans (Abs): 0.1 10*3/uL (ref 0.0–0.1)
Immature Granulocytes: 1 %
Lymphocytes Absolute: 1.5 10*3/uL (ref 0.7–3.1)
Lymphs: 20 %
MCH: 26.6 pg (ref 26.6–33.0)
MCHC: 33.6 g/dL (ref 31.5–35.7)
MCV: 79 fL (ref 79–97)
Monocytes Absolute: 0.8 10*3/uL (ref 0.1–0.9)
Monocytes: 10 %
Neutrophils Absolute: 5.3 10*3/uL (ref 1.4–7.0)
Neutrophils: 68 %
Platelets: 248 10*3/uL (ref 150–450)
RBC: 4.67 x10E6/uL (ref 3.77–5.28)
RDW: 13.6 % (ref 11.7–15.4)
RPR Ser Ql: NONREACTIVE
Rh Factor: POSITIVE
Rubella Antibodies, IGG: 2.83 index (ref >0.99)
WBC: 7.8 10*3/uL (ref 3.4–10.8)

## 2021-07-28 LAB — HCV INTERPRETATION

## 2021-07-28 LAB — TSH: TSH: 0.961 u[IU]/mL (ref 0.450–4.500)

## 2021-07-28 LAB — HEMOGLOBIN A1C
Est. average glucose Bld gHb Est-mCnc: 117 mg/dL
Hgb A1c MFr Bld: 5.7 % — ABNORMAL HIGH (ref 4.8–5.6)

## 2021-07-29 LAB — CULTURE, OB URINE

## 2021-07-29 LAB — URINE CULTURE, OB REFLEX

## 2021-08-01 ENCOUNTER — Telehealth: Payer: Self-pay | Admitting: Lactation Services

## 2021-08-05 LAB — PANORAMA PRENATAL TEST FULL PANEL:PANORAMA TEST PLUS 5 ADDITIONAL MICRODELETIONS: FETAL FRACTION: 2.5

## 2021-08-09 ENCOUNTER — Encounter: Payer: Self-pay | Admitting: Obstetrics & Gynecology

## 2021-08-09 ENCOUNTER — Telehealth: Payer: Self-pay | Admitting: General Practice

## 2021-08-09 NOTE — Telephone Encounter (Signed)
Called patient regarding mychart message and discussed low fetal fraction results with her. Discussed odds of success with repeat draw. Patient verbalized understanding and would like redraw at lab appt next Friday. Note made in appt.

## 2021-08-14 ENCOUNTER — Telehealth: Payer: Self-pay | Admitting: *Deleted

## 2021-08-14 NOTE — Telephone Encounter (Signed)
Received a babyscripts alert for 08/13/21 of 125/100. I called Alicia Terry and she denies edema, headache. She states she thinks it is her machine. She states her Mom checked her blood pressure with her machine and it was 120/87. I offered nurse visit to check her bp with her machine against our bp machine. She agreed to appointment tomorrow after education appointment.  Staci Acosta

## 2021-08-15 ENCOUNTER — Other Ambulatory Visit: Payer: Self-pay

## 2021-08-15 ENCOUNTER — Ambulatory Visit (INDEPENDENT_AMBULATORY_CARE_PROVIDER_SITE_OTHER): Payer: Medicaid Other

## 2021-08-15 ENCOUNTER — Encounter: Payer: Medicaid Other | Attending: Nurse Practitioner | Admitting: Registered"

## 2021-08-15 ENCOUNTER — Ambulatory Visit (INDEPENDENT_AMBULATORY_CARE_PROVIDER_SITE_OTHER): Payer: Medicaid Other | Admitting: Registered"

## 2021-08-15 VITALS — BP 131/81 | HR 90 | Wt 277.0 lb

## 2021-08-15 DIAGNOSIS — O99210 Obesity complicating pregnancy, unspecified trimester: Secondary | ICD-10-CM | POA: Diagnosis not present

## 2021-08-15 DIAGNOSIS — Z3A00 Weeks of gestation of pregnancy not specified: Secondary | ICD-10-CM | POA: Diagnosis not present

## 2021-08-15 DIAGNOSIS — Z713 Dietary counseling and surveillance: Secondary | ICD-10-CM | POA: Diagnosis not present

## 2021-08-15 DIAGNOSIS — O0990 Supervision of high risk pregnancy, unspecified, unspecified trimester: Secondary | ICD-10-CM

## 2021-08-15 DIAGNOSIS — Z0130 Encounter for examination of blood pressure without abnormal findings: Secondary | ICD-10-CM

## 2021-08-15 DIAGNOSIS — R7309 Other abnormal glucose: Secondary | ICD-10-CM

## 2021-08-15 MED ORDER — BLOOD PRESSURE KIT DEVI: 1.0000 | 0 refills | Status: DC

## 2021-08-15 NOTE — Progress Notes (Signed)
Pt here today for BP check. Pt currently [redacted]wks pregnant. Denies any vaginal bleeding or pain.  Pt states cuff at home is having different reading. Pt did not bring cuff from home today. Figured out pt bought BP cuff at [employer] and has wrong cuff size. Rx sent to [facility] for new BP machine with large cuff. Pt aware and will pick up today. Pt advised to check BP weekly unless having hypertension s/s. Pt verbalized understanding. Pt denies any visual changes or headaches and does not have swelling today. Pt has next OB appt on 08/28/21 with Dr Elgie Congo.   BP today in office 131/81.  Shelda Pal

## 2021-08-15 NOTE — Progress Notes (Signed)
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 Pt states she read on babyscripts that breakfast was the most important meal and wants to know if just eating some yogurt in the morning was enough.  Pt states she started an exercise program "[employer] for Pregnancy" which includes yoga and is doing the program with a friend 30 min daily.  Pt states part of the reason she has been getting take out instead of cooking at home is the smell of raw meat makes her sick. Pt states it has kept her from going into the grocery store as well and has decided to use [employer] to get groceries.  Pt states she eats a lot of vegetables, not a lot of milk, but likes [place] yogurt.  Estimated daily fluid intake: 10 bottels of water Supplements: not assessed Sleep: not assessed Stress / self-care: not assessed Current average weekly physical activity: 30 min daily  24-Hr Dietary Recall First Meal: 1-2x/week [place] sausage, eggs OR [place] & egg, 16 oz OJ Snack:  Second Meal: burger, tomato, cheese Snack:  Third Meal: chicken salad Snack:  Beverages: water, vitamin water, OJ 2x/week   NUTRITION DIAGNOSIS  NB-1.1 Food and nutrition-related knowledge deficit As related to nutrition for pregnancy.  As evidenced by pt states gained new  knowledge.  NUTRITION INTERVENTION  Nutrition education (E-1) on the following topics:  Nutrition specific to pregnancy Balanced plate Beverages Food labels  Handouts Provided Include  MyPlate for Moms MyPlate (diabetes)  Learning Style & Readiness for Change Teaching method utilized: Visual & Auditory  Demonstrated degree of understanding via: Teach Back  Barriers to learning/adherence to lifestyle change: none  Goals Established by Pt Eat whole fruit instead of drinking juice Continue choosing beverages low in added sugar Consider meal prepping Aim for balanced meals ~50 g carb max, ~20 g protein min and vegetables daily.   MONITORING & EVALUATION Dietary intake, weekly physical activity, and weight in prn.

## 2021-08-16 ENCOUNTER — Telehealth: Payer: Self-pay | Admitting: Lactation Services

## 2021-08-16 NOTE — Telephone Encounter (Signed)
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   Recommended with mom to call [facility] at [phone] to schedule a telephone genetic counseling session to discuss results.   Informed mom it is recommended that FOB also be tested to see if he carries the same genes. She will be in the office on Friday for Panorama redraw and GTT, she will pick up partner testing kit at that time.   Patient will call with any questions or concerns as needed.

## 2021-08-18 ENCOUNTER — Other Ambulatory Visit: Payer: Medicaid Other

## 2021-08-18 ENCOUNTER — Encounter: Payer: Self-pay | Admitting: Obstetrics & Gynecology

## 2021-08-18 DIAGNOSIS — O0990 Supervision of high risk pregnancy, unspecified, unspecified trimester: Secondary | ICD-10-CM | POA: Diagnosis not present

## 2021-08-18 DIAGNOSIS — R7309 Other abnormal glucose: Secondary | ICD-10-CM | POA: Diagnosis not present

## 2021-08-18 DIAGNOSIS — D563 Thalassemia minor: Secondary | ICD-10-CM | POA: Insufficient documentation

## 2021-08-18 NOTE — Progress Notes (Signed)
Patient came for her labs and redraw her panorama.

## 2021-08-19 LAB — GLUCOSE TOLERANCE, 2 HOURS W/ 1HR
Glucose, 1 hour: 208 mg/dL — ABNORMAL HIGH (ref 70–179)
Glucose, 2 hour: 185 mg/dL — ABNORMAL HIGH (ref 70–152)
Glucose, Fasting: 100 mg/dL — ABNORMAL HIGH (ref 70–91)

## 2021-08-21 ENCOUNTER — Other Ambulatory Visit: Payer: Self-pay | Admitting: Obstetrics & Gynecology

## 2021-08-21 ENCOUNTER — Encounter: Payer: Self-pay | Admitting: Obstetrics & Gynecology

## 2021-08-21 DIAGNOSIS — O24919 Unspecified diabetes mellitus in pregnancy, unspecified trimester: Secondary | ICD-10-CM | POA: Insufficient documentation

## 2021-08-21 DIAGNOSIS — O24419 Gestational diabetes mellitus in pregnancy, unspecified control: Secondary | ICD-10-CM | POA: Insufficient documentation

## 2021-08-21 DIAGNOSIS — Z8632 Personal history of gestational diabetes: Secondary | ICD-10-CM | POA: Insufficient documentation

## 2021-08-21 DIAGNOSIS — O24912 Unspecified diabetes mellitus in pregnancy, second trimester: Secondary | ICD-10-CM

## 2021-08-26 ENCOUNTER — Encounter (HOSPITAL_COMMUNITY): Payer: Self-pay | Admitting: Obstetrics & Gynecology

## 2021-08-26 ENCOUNTER — Inpatient Hospital Stay (HOSPITAL_COMMUNITY)
Admission: AD | Admit: 2021-08-26 | Discharge: 2021-08-26 | Disposition: A | Discharge status: Home / Self Care | Payer: Medicaid Other | Attending: Obstetrics & Gynecology | Admitting: Obstetrics & Gynecology

## 2021-08-26 DIAGNOSIS — N949 Unspecified condition associated with female genital organs and menstrual cycle: Secondary | ICD-10-CM

## 2021-08-26 DIAGNOSIS — Z3A15 15 weeks gestation of pregnancy: Secondary | ICD-10-CM | POA: Insufficient documentation

## 2021-08-26 DIAGNOSIS — R109 Unspecified abdominal pain: Secondary | ICD-10-CM | POA: Diagnosis not present

## 2021-08-26 DIAGNOSIS — O26892 Other specified pregnancy related conditions, second trimester: Secondary | ICD-10-CM | POA: Insufficient documentation

## 2021-08-26 DIAGNOSIS — O09292 Supervision of pregnancy with other poor reproductive or obstetric history, second trimester: Secondary | ICD-10-CM | POA: Insufficient documentation

## 2021-08-26 LAB — PANORAMA PRENATAL TEST FULL PANEL:PANORAMA TEST PLUS 5 ADDITIONAL MICRODELETIONS: FETAL FRACTION: 5.1

## 2021-08-26 LAB — URINALYSIS, ROUTINE W REFLEX MICROSCOPIC
Bacteria, UA: NONE SEEN
Bilirubin Urine: NEGATIVE
Glucose, UA: NEGATIVE mg/dL
Hgb urine dipstick: NEGATIVE
Ketones, ur: NEGATIVE mg/dL
Leukocytes,Ua: NEGATIVE
Nitrite: NEGATIVE
Protein, ur: 30 mg/dL — AB
Specific Gravity, Urine: 1.032 — ABNORMAL HIGH (ref 1.005–1.030)
pH: 5 (ref 5.0–8.0)

## 2021-08-26 MED ORDER — ACETAMINOPHEN 500 MG PO TABS
1000.0000 mg | ORAL_TABLET | Freq: Once | ORAL | Status: AC
  Administered 2021-08-26: 1000 mg via ORAL
  Filled 2021-08-26: qty 2

## 2021-08-26 NOTE — Discharge Instructions (Signed)
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
## 2021-08-28 ENCOUNTER — Other Ambulatory Visit: Payer: Self-pay

## 2021-08-28 ENCOUNTER — Encounter: Payer: Self-pay | Admitting: Obstetrics and Gynecology

## 2021-08-28 ENCOUNTER — Other Ambulatory Visit (HOSPITAL_COMMUNITY)
Admission: RE | Admit: 2021-08-28 | Discharge: 2021-08-28 | Disposition: A | Discharge status: Home / Self Care | Payer: Medicaid Other | Source: Ambulatory Visit | Attending: Obstetrics and Gynecology | Admitting: Obstetrics and Gynecology

## 2021-08-28 ENCOUNTER — Ambulatory Visit (INDEPENDENT_AMBULATORY_CARE_PROVIDER_SITE_OTHER): Payer: Medicaid Other | Admitting: Obstetrics and Gynecology

## 2021-08-28 VITALS — BP 110/81 | HR 97 | Wt 277.6 lb

## 2021-08-28 DIAGNOSIS — O0990 Supervision of high risk pregnancy, unspecified, unspecified trimester: Secondary | ICD-10-CM | POA: Diagnosis not present

## 2021-08-28 DIAGNOSIS — Z3A15 15 weeks gestation of pregnancy: Secondary | ICD-10-CM

## 2021-08-28 DIAGNOSIS — O24419 Gestational diabetes mellitus in pregnancy, unspecified control: Secondary | ICD-10-CM

## 2021-08-28 DIAGNOSIS — O99210 Obesity complicating pregnancy, unspecified trimester: Secondary | ICD-10-CM

## 2021-08-28 DIAGNOSIS — D563 Thalassemia minor: Secondary | ICD-10-CM

## 2021-08-28 DIAGNOSIS — Z6841 Body Mass Index (BMI) 40.0 and over, adult: Secondary | ICD-10-CM | POA: Insufficient documentation

## 2021-08-28 MED ORDER — ACCU-CHEK SOFTCLIX LANCETS MISC: 12 refills | Status: DC

## 2021-08-28 MED ORDER — ACCU-CHEK GUIDE W/DEVICE KIT: 1.0000 | PACK | Freq: Once | 0 refills | Status: AC

## 2021-08-28 MED ORDER — ACCU-CHEK GUIDE VI STRP: ORAL_STRIP | 11 refills | Status: DC

## 2021-08-28 NOTE — Progress Notes (Signed)
   PRENATAL VISIT NOTE  Subjective:  Alicia Terry is a 31 y.o. G3P0110 at 84w6dbeing seen today for ongoing prenatal care.  She is currently monitored for the following issues for this high-risk pregnancy and has History of fetal demise, not currently pregnant; Iron deficiency anemia; Maternal morbid obesity, antepartum (Alicia Terry; Supervision of high risk pregnancy, antepartum; Alpha thalassemia silent carrier; Diabetes mellitus in pregnancy; and BMI 40.0-44.9, adult ([facility] on their problem list.  Pt will start diabetic teaching soon.  Pt only eats 1-2 times per day.  Patient doing well with no acute concerns today. She reports no complaints.  Contractions: Not present. Vag. Bleeding: None.  Movement: Absent. Denies leaking of fluid.   The following portions of the patient's history were reviewed and updated as appropriate: allergies, current medications, past family history, past medical history, past social history, past surgical history and problem list. Problem list updated.  Objective:   Vitals:   08/28/21 1132  BP: 110/81  Pulse: 97  Weight: 277 lb 9.6 oz (125.9 kg)    Fetal Status: Fetal Heart Rate (bpm): 152   Movement: Absent     General:  Alert, oriented and cooperative. Patient is in no acute distress.  Skin: Skin is warm and dry. No rash noted.   Cardiovascular: Normal heart rate noted  Respiratory: Normal respiratory effort, no problems with respiration noted  Abdomen: Soft, gravid, appropriate for gestational age.  Pain/Pressure: Absent     Pelvic: Cervical exam deferred        Extremities: Normal range of motion.  Edema: None  Mental Status:  Normal mood and affect. Normal behavior. Normal judgment and thought content.   Assessment and Plan:  Pregnancy: G3P0110 at 165w6d1. Supervision of high risk pregnancy, antepartum Continue routine prenatal care Pt has diabetic teaching this week [facility], Serum, Open Spina  Bifida - GC/Chlamydia probe amp (Alicia Terry)[facility]. [redacted] weeks gestation of pregnancy   3. Maternal morbid obesity, antepartum ([facility]  4. Alpha thalassemia silent carrier   5. BMI 40.0-44.9, adult ([place]  Preterm labor symptoms and general obstetric precautions including but not limited to vaginal bleeding, contractions, leaking of fluid and fetal movement were reviewed in detail with the patient.  Please refer to After Visit Summary for other counseling recommendations.   Return in about 4 weeks (around 09/25/2021) [facility]   Alicia ShieldsMD Faculty Attending Center for [facility]

## 2021-08-28 NOTE — Addendum Note (Signed)
Addended by: Louisa Second E on: 08/28/2021 02:20 PM   Modules accepted: Orders

## 2021-08-29 LAB — GC/CHLAMYDIA PROBE AMP (~~LOC~~) NOT AT ARMC
Chlamydia: NEGATIVE
Comment: NEGATIVE
Comment: NORMAL
Neisseria Gonorrhea: NEGATIVE

## 2021-08-30 LAB — AFP, SERUM, OPEN SPINA BIFIDA
AFP MoM: 0.62
AFP Value: 15.6 ng/mL
Gest. Age on Collection Date: 15.6 weeks
Maternal Age At EDD: 33.4 yr
OSBR Risk 1 IN: 10000
Test Results:: NEGATIVE
Weight: 278 [lb_av]

## 2021-08-31 ENCOUNTER — Other Ambulatory Visit: Payer: Self-pay

## 2021-08-31 ENCOUNTER — Encounter: Payer: Medicaid Other | Attending: Nurse Practitioner | Admitting: Registered"

## 2021-08-31 ENCOUNTER — Ambulatory Visit (INDEPENDENT_AMBULATORY_CARE_PROVIDER_SITE_OTHER): Payer: Medicaid Other | Admitting: Registered"

## 2021-08-31 DIAGNOSIS — O24912 Unspecified diabetes mellitus in pregnancy, second trimester: Secondary | ICD-10-CM

## 2021-08-31 DIAGNOSIS — Z3A00 Weeks of gestation of pregnancy not specified: Secondary | ICD-10-CM | POA: Insufficient documentation

## 2021-08-31 DIAGNOSIS — O99212 Obesity complicating pregnancy, second trimester: Secondary | ICD-10-CM | POA: Diagnosis not present

## 2021-08-31 NOTE — Progress Notes (Signed)
Cardio-Obstetrics Clinic  New Evaluation  Date:  09/01/2021   ID:  Alicia Terry, DOB 26-Feb-1990, MRN [id number]  PCP:  Venita Lick, NP   [facility] Providers Cardiologist:  None  Electrophysiologist:  None     Referring MD: Venita Lick, NP   Chief Complaint: morbid obesity, pre-diabetes  History of Present Illness:    Alicia Terry is a 31 y.o. female [G3P0110] who is being seen today for the evaluation of morbid obesity at the request of Venita Lick, NP.   Patient seen in clinic by Dr. Harolyn Rutherford on 07/27/21 where she was seen for high risk pregnancy. She was started on ASA 63m daily and referred to Cardio-OB for further risk stratification.   The patient is currently 16w and 3d pregnant. Has been doing well during pregnancy. No nausea, vomiting, chest pain, SOB, LE edema, orthopnea or LE edema. She was diagnosed with pre-diabetes and is monitoring her glucoses. Plans to do beach body pregnancy work-out.  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 Take after 12 weeks for prevention of preeclampsia later in pregnancy   Blood Pressure Monitoring (BLOOD PRESSURE KIT) DEVI 1 Device by Does not apply route  once a week.   Cholecalciferol (VITAMIN D3 GUMMIES ADULT PO) Take by mouth.   ferrous sulfate 325 (65 FE) MG tablet Take 1 tablet (325 mg total) by mouth daily.   glucose blood (ACCU-CHEK GUIDE) test strip Use as instructed; check blood glucose 4 times daily   Prenatal Vit-Fe Fumarate-FA (MULTIVITAMIN-PRENATAL) 27-0.8 MG TABS tablet Take 1 tablet by mouth daily at 12 noon.     Allergies:   Patient has no known allergies.   Social History   Socioeconomic History   Marital status: Single    Spouse name: Not on file   Number of children: Not on file   Years of education: Not on file   Highest education level: Not on file  Occupational History   Occupation: Beautician  Tobacco Use   Smoking status: Never   Smokeless tobacco: Never  Vaping Use   Vaping Use: Never used  Substance and Sexual Activity   Alcohol use: Not Currently   Drug use: Not Currently    Types: Marijuana   Sexual activity: Yes    Birth control/protection: None  Other Topics Concern   Not on file  Social History Narrative   Not on file   Social Determinants of Health   Financial Resource Strain: Low Risk  (08/05/2020)   Overall Financial Resource Strain (CARDIA)    Difficulty of Paying Living Expenses: Not hard at all  Food Insecurity: No Food Insecurity (08/28/2021)   Hunger Vital Sign    Worried About Running Out of Food in the Last Year: Never true    Ran Out of Food in the Last Year: Never true  Transportation Needs: No Transportation Needs (08/28/2021)   PRAPARE - Hydrologist (Medical): No    Lack of Transportation (Non-Medical): No  Physical Activity: Sufficiently Active (08/05/2020)   Exercise Vital Sign    Days of Exercise per Week: 7 days    Minutes of Exercise per Session: 30 min  Stress: No Stress Concern Present (08/05/2020)   [place]    Feeling of Stress : Only a little  Social Connections: Socially  Isolated (08/05/2020)   Social Connection and Isolation Panel [NHANES]    Frequency of Communication with Friends and Family: Twice a week    Frequency of Social Gatherings with Friends and Family: Twice a week    Attends Religious Services: Never    Marine scientist or Organizations: No    Attends Music therapist: Never    Marital Status: Never married      Family History  Problem Relation Age of Onset   Diabetes Mother    Hypertension Mother    Healthy Father    Diabetes Sister    Heart disease Maternal Grandmother    Hypertension Maternal Grandmother    Depression Maternal Grandmother    Heart disease Maternal Grandfather    Hypertension Maternal Grandfather    Depression Maternal Grandfather    Dementia Paternal Grandmother       ROS:   Please see the history of present illness.    All other systems reviewed and are negative.   Labs/EKG Reviewed:    EKG:   EKG is ordered today.  The ekg ordered today demonstrates NSR with HR 89  Recent Labs: 07/27/2021: ALT 35; BUN 9; Creatinine, Ser 0.68; Hemoglobin 12.4; Platelets 248; Potassium 4.2; Sodium 135; TSH 0.961   Recent Lipid Panel Lab Results  Component Value Date/Time   CHOL 135 08/05/2020 01:55 PM   TRIG 34 08/05/2020 01:55 PM   HDL 50 08/05/2020 01:55 PM   Boyd 76 08/05/2020 01:55 PM    Physical Exam:    VS:  BP 120/74   Pulse 89   Ht 5' 8"  (1.727 m)   Wt 279 lb 14.4 oz (127 kg)   LMP 05/09/2021   SpO2 95%   BMI 42.56 kg/m     Wt Readings from Last 3 Encounters:  09/01/21 279 lb 14.4 oz (127 kg)  08/28/21 277 lb 9.6 oz (125.9 kg)  08/26/21 278 lb 4.8 oz (126.2 kg)     GEN:  Well nourished, well developed in no acute distress HEENT: Normal NECK: No JVD; No carotid bruits CARDIAC: RRR, soft systolic murmur RESPIRATORY:  Clear to auscultation without rales, wheezing or rhonchi  ABDOMEN: Gravid, soft MUSCULOSKELETAL:  No edema; No deformity  SKIN: Warm and dry NEUROLOGIC:   Alert and oriented x 3 PSYCHIATRIC:  Normal affect    Risk Assessment/Risk Calculators:   { ASSESSMENT & PLAN:    #Morbid Obesity: #Pre-diabetes #High Risk Pregnancy: Patient with high risk pregnancy with  obesity and pre-diabetes. Discussed importance of healthy lifestyle choices with goal wt gain 11-20lbs. Emphasized that she is okay to exercise and goal is to get 153mn of moderate intensity exercise per week. She is also following with nutrition and committing to healthy diet options. She is monitoring her blood pressure closely and remains normotensive. On ASA. Will need ongoing prevention/screening once no longer pregnant. -Discussed that she should begin exercising with goal 1569m of moderate intensity exercise per week -Following with nutrition and emphasized importance of trying to prevent gestational diabetes -She is monitoring BP and remains normotensive -Continue ASA 8174maily -Will need continued follow-up following pregnancy for CV prevention/screening  Patient Instructions  Medication Instructions:   Your physician recommends that you continue on your current medications as directed. Please refer to the Current Medication list given to you today.  *If you need a refill on your cardiac medications before your next appointment, please call your pharmacy*   Follow-Up:  IN 6 WEEKS [facility]  Important Information About Sugar         Dispo:  No follow-ups on file.   Medication Adjustments/Labs and Tests Ordered: Current medicines are reviewed at length with the patient today.  Concerns regarding medicines are outlined above.  Tests Ordered: Orders Placed This Encounter  Procedures   EKG 12-Lead   Medication Changes: No orders of the defined types were placed in this encounter.

## 2021-08-31 NOTE — Progress Notes (Signed)
Patient was seen for Gestational Diabetes self-management on 08/31/21  Start time 1420 and End time 1513   Estimated due date: 02/13/2022; [redacted]w[redacted]d Clinical: Medications: reviewed Medical History: iron def anemia Labs: OGTT 100-208-185, A1c 5.7%   Dietary and Lifestyle History: Pt states she made changes since last visit and stopped drinking sweet tea, eating more vegetables. Pt reports [facility]  Physical Activity: BTeagueBody for pregnancy Stress: not assessed Sleep: not assessed  24 hr Recall: not assessed  NUTRITION INTERVENTION  Nutrition education (E-1) on the following topics:   Initial Follow-up  '[x]'$  '[]'$  Definition of Gestational Diabetes '[x]'$  '[]'$  Why dietary management is important in controlling blood glucose '[x]'$  '[]'$  Effects each nutrient has on blood glucose levels '[x]'$  '[]'$  Simple carbohydrates vs complex carbohydrates '[x]'$  '[]'$  Fluid intake '[x]'$  '[]'$  Creating a balanced meal plan '[x]'$  '[]'$  Carbohydrate counting  '[x]'$  '[]'$  When to check blood glucose levels '[x]'$  '[]'$  Proper blood glucose monitoring techniques '[x]'$  '[]'$  Effect of stress and stress reduction techniques  '[x]'$  '[]'$  Exercise effect on blood glucose levels, appropriate exercise during pregnancy '[x]'$  '[]'$  Importance of limiting caffeine and abstaining from alcohol and smoking '[]'$  '[]'$  Medications used for blood sugar control during pregnancy '[x]'$  '[]'$  Hypoglycemia and rule of 15 '[x]'$  '[]'$  Postpartum self care  Patient brought meter to visit for instruction: CBS: 88 mg/dL  Patient instructed to monitor glucose levels: FBS: 60 - ? 95 mg/dL (some clinics use 90 for cutoff) 1 hour: ? 140 mg/dL 2 hour: ? 120 mg/dL  Patient received handouts: Nutrition Diabetes and Pregnancy Carbohydrate Counting List  Patient will be seen for follow-up as needed.

## 2021-09-01 ENCOUNTER — Encounter: Payer: Self-pay | Admitting: Cardiology

## 2021-09-01 ENCOUNTER — Ambulatory Visit (INDEPENDENT_AMBULATORY_CARE_PROVIDER_SITE_OTHER): Payer: Medicaid Other | Admitting: Cardiology

## 2021-09-01 VITALS — BP 120/74 | HR 89 | Ht 68.0 in | Wt 279.9 lb

## 2021-09-01 DIAGNOSIS — O99210 Obesity complicating pregnancy, unspecified trimester: Secondary | ICD-10-CM

## 2021-09-01 DIAGNOSIS — R7303 Prediabetes: Secondary | ICD-10-CM | POA: Diagnosis not present

## 2021-09-01 DIAGNOSIS — O0990 Supervision of high risk pregnancy, unspecified, unspecified trimester: Secondary | ICD-10-CM | POA: Diagnosis not present

## 2021-09-01 NOTE — Patient Instructions (Signed)
Medication Instructions:   Your physician recommends that you continue on your current medications as directed. Please refer to the Current Medication list given to you today.  *If you need a refill on your cardiac medications before your next appointment, please call your pharmacy*   Follow-Up:  IN 6 WEEKS WITH DR. Johney Frame HERE AT [place]

## 2021-09-19 ENCOUNTER — Ambulatory Visit: Payer: Medicaid Other | Admitting: *Deleted

## 2021-09-19 ENCOUNTER — Ambulatory Visit: Payer: Medicaid Other | Attending: Obstetrics | Admitting: Obstetrics

## 2021-09-19 ENCOUNTER — Ambulatory Visit: Payer: Medicaid Other | Attending: Obstetrics & Gynecology

## 2021-09-19 ENCOUNTER — Encounter: Payer: Self-pay | Admitting: *Deleted

## 2021-09-19 ENCOUNTER — Other Ambulatory Visit: Payer: Self-pay | Admitting: *Deleted

## 2021-09-19 ENCOUNTER — Other Ambulatory Visit: Payer: Self-pay | Admitting: Obstetrics & Gynecology

## 2021-09-19 ENCOUNTER — Ambulatory Visit: Payer: Medicaid Other

## 2021-09-19 VITALS — BP 119/72 | HR 101

## 2021-09-19 DIAGNOSIS — O99212 Obesity complicating pregnancy, second trimester: Secondary | ICD-10-CM

## 2021-09-19 DIAGNOSIS — O09892 Supervision of other high risk pregnancies, second trimester: Secondary | ICD-10-CM

## 2021-09-19 DIAGNOSIS — Z362 Encounter for other antenatal screening follow-up: Secondary | ICD-10-CM

## 2021-09-19 DIAGNOSIS — O24410 Gestational diabetes mellitus in pregnancy, diet controlled: Secondary | ICD-10-CM | POA: Diagnosis not present

## 2021-09-19 DIAGNOSIS — O09292 Supervision of pregnancy with other poor reproductive or obstetric history, second trimester: Secondary | ICD-10-CM | POA: Diagnosis not present

## 2021-09-19 DIAGNOSIS — O0990 Supervision of high risk pregnancy, unspecified, unspecified trimester: Secondary | ICD-10-CM

## 2021-09-19 DIAGNOSIS — Z3A19 19 weeks gestation of pregnancy: Secondary | ICD-10-CM | POA: Diagnosis not present

## 2021-09-19 DIAGNOSIS — Z8759 Personal history of other complications of pregnancy, childbirth and the puerperium: Secondary | ICD-10-CM

## 2021-09-19 NOTE — Progress Notes (Signed)
MFM Note  Alicia Terry was seen for a detailed fetal anatomy scan due to maternal obesity with a BMI of 41.8.  She was recently diagnosed with diet-controlled gestational diabetes.    Her prior pregnancy was complicated by a previable delivery at 21 weeks and 4 days following PPROM.  She had a cell free DNA test earlier in her pregnancy which indicated a low risk for trisomy 32, 56, and 13. A female fetus is predicted.   She was informed that the fetal growth and amniotic fluid level were appropriate for her gestational age.   There were no obvious fetal anomalies noted on today's ultrasound exam.  However, the views of the fetal anatomy were limited today due to the fetal position.  The patient was informed that anomalies may be missed due to technical limitations. If the fetus is in a suboptimal position or maternal habitus is increased, visualization of the fetus in the maternal uterus may be impaired.  Due to her history of a previable delivery, a transvaginal ultrasound performed today showed a cervical length of 3.8 cm long without any signs of funneling.  The patient was reassured that based on the normal cervical length, her risk of a preterm delivery is low at this time.  The following were discussed during today's consultation:  Gestational diabetes and pregnancy  The implications and management of diabetes in pregnancy was discussed in detail with the patient.    She was advised to continue to monitor her fingersticks 4 times daily (fasting and 2 hours after each meal).    She was advised that our goals for her fingerstick values are fasting values of 90-95 or less and two-hour postprandial values of 120 or less.   Should the majority of her fingerstick results be above these values, she may have to be started on metformin or insulin to help her achieve better glycemic control.   The patient was advised that getting her fingerstick values as close to these goals as possible  would provide her with the most optimal obstetrical outcome.  The increased risk of polyhydramnios, fetal macrosomia, and preeclampsia associated with diabetes was also discussed.    We will continue to follow her with monthly growth ultrasounds.  The patient was advised that delivery for well-controlled diabetes in pregnancy is usually recommended at around 39 weeks.    Delivery at 37 weeks may be considered should her glycemic control be poor.  Obesity in pregnancy  The recommended total weight gain in pregnancy for obese women's between 10 to 20 pounds.  As maternal obesity may present challenges associated with the management of anesthesia, an anesthesia consult should be obtained when she is admitted in labor.  As her BMI is greater than 40, we will start weekly fetal testing at 34 weeks.  A follow-up growth scan and cervical length measurement was scheduled in 4 weeks.  The patient stated that all of her questions have been answered to her satisfaction.    A total of 30 minutes was spent counseling and coordinating the care for this patient.  Greater than 50% of the time was spent in direct face-to-face contact.

## 2021-09-28 ENCOUNTER — Encounter: Payer: Medicaid Other | Admitting: Family Medicine

## 2021-10-02 ENCOUNTER — Other Ambulatory Visit: Payer: Self-pay

## 2021-10-02 ENCOUNTER — Ambulatory Visit (INDEPENDENT_AMBULATORY_CARE_PROVIDER_SITE_OTHER): Payer: Medicaid Other | Admitting: Obstetrics and Gynecology

## 2021-10-02 ENCOUNTER — Encounter: Payer: Self-pay | Admitting: Obstetrics and Gynecology

## 2021-10-02 VITALS — BP 121/82 | HR 108 | Wt 283.1 lb

## 2021-10-02 DIAGNOSIS — O99210 Obesity complicating pregnancy, unspecified trimester: Secondary | ICD-10-CM

## 2021-10-02 DIAGNOSIS — D563 Thalassemia minor: Secondary | ICD-10-CM

## 2021-10-02 DIAGNOSIS — Z8759 Personal history of other complications of pregnancy, childbirth and the puerperium: Secondary | ICD-10-CM

## 2021-10-02 DIAGNOSIS — O0990 Supervision of high risk pregnancy, unspecified, unspecified trimester: Secondary | ICD-10-CM

## 2021-10-02 DIAGNOSIS — O24019 Pre-existing diabetes mellitus, type 1, in pregnancy, unspecified trimester: Secondary | ICD-10-CM

## 2021-10-02 LAB — POCT URINALYSIS DIP (DEVICE)
Bilirubin Urine: NEGATIVE
Glucose, UA: NEGATIVE mg/dL
Hgb urine dipstick: NEGATIVE
Ketones, ur: NEGATIVE mg/dL
Leukocytes,Ua: NEGATIVE
Nitrite: NEGATIVE
Protein, ur: NEGATIVE mg/dL
Specific Gravity, Urine: >=1.03 (ref 1.005–1.030)
Urobilinogen, UA: 0.2 mg/dL (ref 0.0–1.0)
pH: 6 (ref 5.0–8.0)

## 2021-10-02 NOTE — Patient Instructions (Signed)

## 2021-10-02 NOTE — Progress Notes (Signed)
C/o edema in left ankle, but denies pain in calf. Denies feeling baby movement at all this pregnancy.

## 2021-10-02 NOTE — Progress Notes (Signed)
Subjective:  Alicia Terry is a 31 y.o. G3P0110 at 71w6dbeing seen today for ongoing prenatal care.  She is currently monitored for the following issues for this high-risk pregnancy and has History of fetal demise, not currently pregnant; Iron deficiency anemia; Maternal morbid obesity, antepartum ([place]; Supervision of high risk pregnancy, antepartum; Alpha thalassemia silent carrier; Diabetes mellitus in pregnancy; and BMI 40.0-44.9, adult ([place] on their problem list.  Patient reports no complaints.  Contractions: Not present. Vag. Bleeding: None.  Movement: Absent. Denies leaking of fluid.   The following portions of the patient's history were reviewed and updated as appropriate: allergies, current medications, past family history, past medical history, past social history, past surgical history and problem list. Problem list updated.  Objective:   Vitals:   10/02/21 1504  BP: 121/82  Pulse: (!) 108  Weight: 283 lb 1.6 oz (128.4 kg)    Fetal Status: Fetal Heart Rate (bpm): 140   Movement: Absent     General:  Alert, oriented and cooperative. Patient is in no acute distress.  Skin: Skin is warm and dry. No rash noted.   Cardiovascular: Normal heart rate noted  Respiratory: Normal respiratory effort, no problems with respiration noted  Abdomen: Soft, gravid, appropriate for gestational age. Pain/Pressure: Absent     Pelvic:  Cervical exam deferred        Extremities: Normal range of motion.  Edema: Trace  Mental Status: Normal mood and affect. Normal behavior. Normal judgment and thought content.   Urinalysis:      Assessment and Plan:  Pregnancy: G3P0110 at 256w6d1. Supervision of high risk pregnancy, antepartum Stable  2. Maternal morbid obesity, antepartum (HCC) Stable Growth scan scheduled  3. Pre-existing type 1 diabetes mellitus during pregnancy, antepartum CBG's reviewed and in goal range Continue with diet Fetal ECHO ordered  4. Alpha thalassemia silent  carrier Partner negative  5. History of fetal demise, not currently pregnant Stable NL CL at anatomy scan F/U ordered  Preterm labor symptoms and general obstetric precautions including but not limited to vaginal bleeding, contractions, leaking of fluid and fetal movement were reviewed in detail with the patient. Please refer to After Visit Summary for other counseling recommendations.  Return in about 4 weeks (around 10/30/2021) for OB visit, face to face, MD only.   ErChancy MilroyMD

## 2021-10-18 NOTE — Progress Notes (Unsigned)
[facility]  Follow-up Visit  Date:  10/20/2021   ID:  Alicia Terry, DOB 1990/02/17, MRN [id number]  PCP:  Venita Lick, NP   [facility] Providers Cardiologist:  None  Electrophysiologist:  None     Referring MD: Venita Lick, NP   Chief Complaint: morbid obesity, pre-diabetes  History of Present Illness:    Alicia Terry is a 30 y.o. female [G3P0110] who presents to clinic for follow-up.  Patient seen in clinic by Dr. Harolyn Rutherford on 07/27/21 where she was seen for high risk pregnancy. She was started on ASA 30m daily and referred to Cardio-OB for further risk stratification given morbid obesity.  Was last seen in clinic on 09/01/21 where she was doing well. Was going to try to start exercising. Had been diagnosed with pre-diabetes in pregnancy and was working with a dietician to help.   Today, the patient feels well. Has been working on diet and is trying to remain active. BG have been well controlled with no readings above 120. Blood pressure is well controlled not on medication. Has been compliant with aspirin. Denies any chest pain, SOB, orthopnea or PND. No LE edema.   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 Take after 12 weeks for prevention of preeclampsia later in pregnancy   Blood Glucose Monitoring Suppl (ACCU-CHEK GUIDE ME) w/Device KIT See admin instructions.   Blood Pressure Monitoring (BLOOD PRESSURE KIT) DEVI 1 Device by Does not apply route once a week.   Cholecalciferol (VITAMIN D3 GUMMIES ADULT PO) Take by mouth.   ferrous sulfate 325 (65 FE) MG tablet Take 1 tablet (325 mg total) by mouth daily.   glucose blood (ACCU-CHEK GUIDE) test strip Use as instructed; check blood glucose 4 times daily   Prenatal Vit-Fe Fumarate-FA (MULTIVITAMIN-PRENATAL) 27-0.8 MG TABS tablet Take 1 tablet by mouth daily at 12 noon.     Allergies:   Patient has no known allergies.   Social History   Socioeconomic History   Marital status: Single    Spouse name: Not on file   Number of children: Not on file   Years of education: Not on file   Highest education level: Not on file  Occupational History   Occupation: Beautician  Tobacco Use   Smoking status: Never   Smokeless tobacco: Never  Vaping Use   Vaping  Use: Never used  Substance and Sexual Activity   Alcohol use: Not Currently   Drug use: Not Currently    Types: Marijuana   Sexual activity: Yes    Birth control/protection: None  Other Topics Concern   Not on file  Social History Narrative   Not on file   Social Determinants of Health   Financial Resource Strain: Low Risk  (08/05/2020)   Overall Financial Resource Strain (CARDIA)    Difficulty of Paying Living Expenses: Not hard at all  Food Insecurity: No Food Insecurity (10/02/2021)   Hunger Vital Sign    Worried About Running Out of Food in the Last Year: Never true    Ran Out of Food in the Last Year: Never true  Transportation Needs: No Transportation Needs (10/02/2021)   PRAPARE - Hydrologist (Medical): No    Lack of Transportation (Non-Medical): No  Physical Activity: Sufficiently Active (08/05/2020)   Exercise Vital Sign    Days of  Exercise per Week: 7 days    Minutes of Exercise per Session: 30 min  Stress: No Stress Concern Present (08/05/2020)   [place]    Feeling of Stress : Only a little  Social Connections: Socially Isolated (08/05/2020)   Social Connection and Isolation Panel [NHANES]    Frequency of Communication with Friends and Family: Twice a week    Frequency of Social Gatherings with Friends and Family: Twice a week    Attends Religious Services: Never    Marine scientist or Organizations: No    Attends Music therapist: Never    Marital Status: Never married      Family History  Problem Relation Age of Onset   Diabetes Mother    Hypertension Mother    Healthy Father    Diabetes Sister    Heart disease Maternal Grandmother    Hypertension Maternal Grandmother    Depression Maternal Grandmother    Heart disease Maternal Grandfather    Hypertension Maternal Grandfather    Depression Maternal Grandfather    Dementia Paternal Grandmother       ROS:   Please see the history of present illness.    All other systems reviewed and are negative.   Labs/EKG Reviewed:    EKG:   EKG is ordered today.  The ekg ordered today demonstrates NSR with HR 89  Recent Labs: 07/27/2021: ALT 35; BUN 9; Creatinine, Ser 0.68; Hemoglobin 12.4; Platelets 248; Potassium 4.2; Sodium 135; TSH 0.961   Recent Lipid Panel Lab Results  Component Value Date/Time   CHOL 135 08/05/2020 01:55 PM   TRIG 34 08/05/2020 01:55 PM   HDL 50 08/05/2020 01:55 PM   Sharon 76 08/05/2020 01:55 PM    Physical Exam:    VS:  BP 120/74   Pulse 97   Ht _0  (1.727 m)   Wt 286 lb (129.7 kg)   LMP 05/09/2021 (Exact Date)   SpO2 99%   BMI 43.49 kg/m     Wt Readings from Last 3 Encounters:  10/20/21 286 lb (129.7 kg)  10/02/21 283 lb 1.6 oz (128.4 kg)  09/01/21 279 lb 14.4 oz (127 kg)     GEN:  Comfortable, NAD HEENT: Normal NECK: No  JVD, no carotid bruits CARDIAC: RRR, soft systolic flow murmur RESPIRATORY:  CTAB, no wheezes ABDOMEN: Gravid, soft MUSCULOSKELETAL:  No edema; No deformity  SKIN: Warm and dry NEUROLOGIC:  Alert and oriented  x 3 PSYCHIATRIC:  Normal affect    Risk Assessment/Risk Calculators:   { ASSESSMENT & PLAN:    #Morbid Obesity: #Pre-diabetes #High Risk Pregnancy: Patient with high risk pregnancy with obesity and pre-diabetes. Discussed importance of healthy lifestyle choices with goal wt gain 11-20lbs. She is trying to remain active with a goal of 154mn of exercise per week. She has also worked on diet and has maintained her BG<120. Has been monitoring her blood pressures closely and they are well controlled without medications. Will continue to monitor throughout her pregnancy. -Continue exercise with goal 1523m of moderate intensity exercise per week -Following with dietician and emphasized importance of trying to prevent gestational diabetes -She is monitoring BP and remains normotensive -Continue ASA 8129maily -Will need continued follow-up following pregnancy for CV prevention/screening  Patient Instructions  Medication Instructions:   Your physician recommends that you continue on your current medications as directed. Please refer to the Current Medication list given to you today.  *If you need a refill on your cardiac medications before your next appointment, please call your pharmacy*    Follow-Up:  11 [place] [place]   Medication Adjustments/Labs and Tests Ordered: Current medicines are reviewed at length with the patient today.  Concerns regarding medicines are outlined above.  Tests Ordered: No orders of the defined types were placed in this encounter.  Medication Changes: No orders of the defined types were placed in this encounter.

## 2021-10-20 ENCOUNTER — Ambulatory Visit: Payer: Medicaid Other | Attending: Obstetrics

## 2021-10-20 ENCOUNTER — Ambulatory Visit (INDEPENDENT_AMBULATORY_CARE_PROVIDER_SITE_OTHER): Payer: Medicaid Other | Admitting: Cardiology

## 2021-10-20 ENCOUNTER — Encounter: Payer: Self-pay | Admitting: Cardiology

## 2021-10-20 ENCOUNTER — Ambulatory Visit: Payer: Medicaid Other | Admitting: *Deleted

## 2021-10-20 VITALS — BP 120/74 | HR 97 | Ht 68.0 in | Wt 286.0 lb

## 2021-10-20 VITALS — BP 113/62 | HR 90

## 2021-10-20 DIAGNOSIS — O0990 Supervision of high risk pregnancy, unspecified, unspecified trimester: Secondary | ICD-10-CM | POA: Insufficient documentation

## 2021-10-20 DIAGNOSIS — Z3A23 23 weeks gestation of pregnancy: Secondary | ICD-10-CM | POA: Diagnosis not present

## 2021-10-20 DIAGNOSIS — Z8759 Personal history of other complications of pregnancy, childbirth and the puerperium: Secondary | ICD-10-CM | POA: Insufficient documentation

## 2021-10-20 DIAGNOSIS — O09212 Supervision of pregnancy with history of pre-term labor, second trimester: Secondary | ICD-10-CM

## 2021-10-20 DIAGNOSIS — Z362 Encounter for other antenatal screening follow-up: Secondary | ICD-10-CM | POA: Diagnosis not present

## 2021-10-20 DIAGNOSIS — O24312 Unspecified pre-existing diabetes mellitus in pregnancy, second trimester: Secondary | ICD-10-CM

## 2021-10-20 DIAGNOSIS — R7303 Prediabetes: Secondary | ICD-10-CM | POA: Diagnosis not present

## 2021-10-20 DIAGNOSIS — O99212 Obesity complicating pregnancy, second trimester: Secondary | ICD-10-CM

## 2021-10-20 DIAGNOSIS — O99210 Obesity complicating pregnancy, unspecified trimester: Secondary | ICD-10-CM

## 2021-10-20 NOTE — Patient Instructions (Signed)
Medication Instructions:   Your physician recommends that you continue on your current medications as directed. Please refer to the Current Medication list given to you today.  *If you need a refill on your cardiac medications before your next appointment, please call your pharmacy*    Follow-Up:  11 weeks with Dr. Johney Frame here at [facility]

## 2021-10-23 ENCOUNTER — Other Ambulatory Visit: Payer: Self-pay | Admitting: *Deleted

## 2021-10-23 DIAGNOSIS — O99212 Obesity complicating pregnancy, second trimester: Secondary | ICD-10-CM

## 2021-10-23 DIAGNOSIS — Z362 Encounter for other antenatal screening follow-up: Secondary | ICD-10-CM

## 2021-10-30 ENCOUNTER — Other Ambulatory Visit: Payer: Self-pay

## 2021-10-30 ENCOUNTER — Ambulatory Visit (INDEPENDENT_AMBULATORY_CARE_PROVIDER_SITE_OTHER): Payer: Medicaid Other | Admitting: Obstetrics & Gynecology

## 2021-10-30 VITALS — BP 127/83 | HR 98 | Wt 285.6 lb

## 2021-10-30 DIAGNOSIS — O24912 Unspecified diabetes mellitus in pregnancy, second trimester: Secondary | ICD-10-CM

## 2021-10-30 DIAGNOSIS — Z3A24 24 weeks gestation of pregnancy: Secondary | ICD-10-CM

## 2021-10-30 DIAGNOSIS — O99212 Obesity complicating pregnancy, second trimester: Secondary | ICD-10-CM

## 2021-10-30 DIAGNOSIS — Z8759 Personal history of other complications of pregnancy, childbirth and the puerperium: Secondary | ICD-10-CM

## 2021-10-30 DIAGNOSIS — O0990 Supervision of high risk pregnancy, unspecified, unspecified trimester: Secondary | ICD-10-CM

## 2021-10-30 NOTE — Patient Instructions (Signed)
AREA PEDIATRIC/FAMILY PRACTICE PHYSICIANS  Central/[place]outheast [place] ([address]) [facility] Chambliss, MD; Eniola, MD; Hale, MD; Hensel, MD; McDiarmid, MD; McIntyer, MD; Neal, MD; Walden, MD [address] [place], [place] [address] [phone] Mon-Fri 8:30-12:30, 1:30-5:00 Providers come to see babies at [facility] Accepting Medicaid [facility] providers who accept newborns: Koirala, MD; Morrow, MD; Wolters, MD [address], [place], [place] [address] [phone] Mon-Fri 8:00-5:30 Babies seen by providers at [facility] Does NOT accept Medicaid Please call early in hospitalization for appointment (limited availability)  [facility] Mulberry, MD [address] [place], [place] [address] [phone] Mon, Tue, Thur, Fri 8:30-5:00, Wed 10:00-7:00 (closed 1-2pm) Babies seen by [facility] providers Accepting Medicaid Rubin - Pediatrician Rubin, MD [address], [place], [place] [address] [phone] Mon-Fri 8:30-5:00, Sat 8:30-12:00 Provider comes to see babies at [facility] Accepting Medicaid Must have been referred from current patients or contacted office prior to delivery [facility] ([facility]) Brown, MD; Chandler, MD; Ettefagh, MD; Grant, MD; Lester, MD; McCormick, MD; McQueen, MD; Prose, MD; Simha, MD; Stanley, MD; Stryffeler, NP; Tebben, NP [address], [place], [place] [address] [phone] Mon, Tue, Thur, Fri 8:30-5:30, Wed 9:30-5:30, Sat 8:30-12:30 Babies seen by [facility] providers Accepting Medicaid Only accepting infants of first-time parents or siblings of current patients Hospital discharge coordinator will make follow-up appointment Alicia Terry [address], [place], [place]  [address] [phone]   Fax - [phone] [facility] [address], [place], [place]  [address] Phone - [phone]   Fax - [phone] Alicia Terry [address], [place], [place]  [address] [phone]  [place] ([address])  [facility] Bates, MD; Brassfield, MD; Cooper, Cox, MD; MD; Davis, MD; Dovico, MD; Ettefaugh, MD; Little, MD; Lowe, MD; Keiffer, MD; Melvin, MD; Sumner, MD; Williams, MD [address], [place], [place] [address] [phone] Mon-Fri 8:30-5:00 (extended evenings Mon-Thur as needed), Sat-Sun 10:00-1:00 Providers come to see babies at [facility] Accepting Medicaid for families of first-time babies and families with all children in the household age 3 and under. Must register with office prior to making appointment (M-F only). [facility] Henson, NP; Knapp, MD; Lalonde, MD; Tysinger, PA [address] [place], [place] [address] [phone] Mon-Fri 8:00-5:00 Babies seen by providers at [facility] Does NOT accept Medicaid/Commercial Insurance Only [facility] ([facility])  Artis, MD; Barnes, MD; Bratton, MD; Coccaro, MD; [place] Gardner, MD; Kramer, MD; Marshall, MD; Netherton, MD; Poleto, MD; Skinner, MD [address] [place], [place] [address] [phone] Mon-Fri 8:30-5:30, Sat (Oct.-Mar.) 9:00-1:00 Babies seen by providers at [facility] Accepting Medicaid  [place] ([address]) [facility] Reid, MD; Warner, MD [address], [place], [place] [address] [phone] Mon-Fri 8:30-5:00, Sat 8:30-12:00 Providers come to see babies at [facility] Does NOT accept Medicaid [facility] Becker, PA; Hagler, MD; Scifres, PA; Sun, MD; Swayne, MD [address], [place], [place] [address] [phone] Mon-Fri 8:00-5:00 Babies seen by providers at [facility] Does NOT accept Medicaid Only accepting babies of parents who  are patients Please call early in hospitalization for appointment (limited availability) [facility] Clark, MD; Frye, MD; Kelleher, MD; Mack, NP; Miller, MD; O'Keller, MD; Patterson, NP; Pudlo, MD; Puzio, MD; Thomas, MD; Tucker, MD; Twiselton, MD [address], [place], [place] [address] [phone] Mon-Fri 8:00-5:00, Sat 9:00-12:00 Providers come to see babies at [facility] Does NOT accept Medicaid  [place] ([address]) [facility] providers accepting new patients: Brake, NP; Wharton, PA [address], [place], [place] [address] [phone] Mon-Fri 8:00-5:00 Babies seen by providers at [facility] Does NOT accept Medicaid Only accepting babies of parents who are patients Please call early in hospitalization for appointment (limited availability) [facility] Gay, MD; Quinlan, MD [address] [place], [place] [address] [phone] (press 1 to schedule appointment) Mon-Fri 8:00-5:00 Providers come to see babies at [facility] Does NOT accept Medicaid [facility] Mazer, MD [address] [place], [place] [address] [phone] Mon-Fri 8:30-5:00 (lunch 12:30-1:00), extended hours by appointment only Wed 5:00-6:30 Babies seen by [facility] providers Accepting Medicaid [facility] Banks, MD; Jordan, MD; Koberlein, MD [address], [place], [place] [address] [phone] Mon-Fri 8:00-5:00 Babies seen by [facility] providers Does NOT accept Medicaid [facility] Parker, MD; Hunter, MD; Wallace, DO [address] [place], [place] [address] [phone] Mon-Fri 8:00-5:00 Babies seen by [facility] providers Does NOT accept Medicaid [facility] [place], PA; Brecken, PA; Christy, NP; Dees, MD; DeClaire, MD; DeWeese, MD; Hansen, NP; Mills, NP; Parrish, NP; Smoot, NP; Summer, MD; Vapne,  MD [address] [place], [place] [address] [phone] Mon-Fri 8:30-5:00, Sat 10:00-1:00 Providers come to see babies at [facility] Does NOT accept Medicaid Free prenatal information session Tuesdays at 4:45pm [facility] Bouska, MD; Gordon, PA; Jeffery, PA; Weber, PA [address] [place] [address] [phone] Mon-Fri 7:30-5:30 Babies seen by [facility] providers [facility] [address], [place], [place]  [address] [phone]   Fax - [phone]  [place] ([address] & [address]) [facility] [place] [address] [place], [place] [address] [phone] Mon-Thur 8:00-6:00 Providers come to see babies at [facility] Accepting Medicaid [facility] Anderson, NP; Badger, MD; Beal, PA; Spencer, PA [address] [place], [place] [address] [phone] Mon-Thur 7:30-7:30, Fri 7:30-4:30 Babies seen by [facility] providers Accepting Medicaid [facility] Agbuya, MD; Klett, NP; Romgoolam, MD [address], [place], [place] [address] [phone] Mon-Fri 8:30-5:00, Sat 8:30-12:00 Providers come to see babies at [facility] Accepting Medicaid Must have "Meet & Greet" appointment at office prior to delivery [facility] ([facility] McCord, MD; Wallace, MD; Wood, MD [address], [place], [place] [address] [phone] Mon-Wed 8:00-6:00, Thur-Fri 8:00-5:00, Sat 9:00-12:00 Providers come to see babies at [facility] Does NOT accept Medicaid Only accepting siblings of current patients [facility]  [address], [place], [place]  [address] [phone]   Fax - [phone] [facility] [address], [place], [place]  [address] [phone]   Fax -  [phone]  [place] ([address] & [address]) [facility] Cirigliano, DO; Matthews, DO [address] [place], [place] [address] [phone] Mon-Fri 7:00-5:00 Babies seen by [facility] providers Does NOT accept Medicaid [facility] Briscoe, MD; Howley, PA; Moreira, PA [address], [place], [place] [address] [phone] Mon-Fri 8:00-5:00 Babies seen by [facility] providers Accepting Medicaid [facility] Boyd, MD; Church, PA; Jones, NP; Osborn, PA [address], , [place] [address] [phone] Mon-Fri 8:00-5:00 Babies seen by providers at [facility] Accepting Medicaid  [place] ([address]) [facility] [place] Wend[place], DO [address] [place], [place] [address] [phone] Mon-Fri 8:00-5:00 Babies seen by [facility] providers Does NOT accept Medicaid Limited availability, please call early in hospitalization to schedule follow-up [facility] Calderon, PA; Cummings, MD; Dillard, MD; Martin, PA; Olson, MD; VanDeven, PA [address], [place], [place] [address] [phone] Mon-Fri 8:30-5:00, Sat 9:00-12:00 Babies seen by providers at [facility] Accepting Medicaid Please register online then schedule online or call office [email or web address]) Hunter, NP; Kumar, MD; Martin Rogers, PA [address], [place], [place] [address] [phone] Mon-Fri 8:00-5:00 Babies seen by providers at [facility] Accepting Medicaid [facility] ([facility]) [place], MD; Kristi Fleenor, NP; West, MD [address], [place], [place] [address] [phone] Mon-Fri 8:00-5:30, Sat&Sun by appointment (phones open at  8:30) Babies seen by [facility] providers Accepting Medicaid Must be a first-time baby or sibling of current patient [facility] [place]  [address], [place], [place]  [address] [phone]   Fax - [phone]  [place] ([address]) [facility] Brown, PA; Cowen, PA; Rice, MD; Helton, PA; Spry, MD [address] [place], [place] [address] [phone] Mon-Thur 8:00-7:00, Fri 8:00-5:00, Sat 8:00-12:00, Sun 9:00-12:00 Babies seen by [facility] providers Accepting Medicaid [facility] Coe-Goins, MD; Marshall, MD; Pierre-Louis, MD [address], [place], [place] [address] [phone] Mon-Thur 8:00-5:00 Babies seen by providers at [facility] Accepting Medicaid [facility] Bratton, MD; Coe-Goins, MD; Hayes, MD; Lewis, MD; List, MD; Lott, MD; Marshall, MD; Moran, MD; O'Neal, MD; Pierre-Louis, MD; Pitonzo, MD; Scholer, MD; Spangle, MD [address] [place], [place] [address] [phone] Mon-Fri 8:00-5:30, Sat (Oct.-Mar.) 9:00-1:00 Babies seen by providers at [facility] Accepting Medicaid Must fill out new patient packet, available online at [email or web address] [facility] ([facility]) Friddle, NP; Harris, NP; Kelly, NP; Logan, MD; Melvin, PA; Poth, MD; Ramadoss, MD; Stanton, NP [address], [place], [place] [address] [phone] Mon-Thur 8:00-5:30, Fri 8:00-5:00 Babies seen by providers at [facility] Accepting Medicaid  [place] ([address]) [facility] Dixon, PA; Warsaw, MD; Pickard, MD; Tapia, PA [address], [place], [place] [address] [phone] Mon-Fri 8:00-5:00 Babies seen by providers at [facility] Accepting Medicaid   [place] ([address]) [facility] [place] Masneri, DO; Meyers, MD; Nelson, PA [address], [place], [place] [address] [phone] Mon-Fri 8:00-5:00 Babies seen by providers at [facility] Does NOT accept Medicaid Limited appointment availability, please call early in hospitalization  [facility] [place] Kunedd, DO; McGowen, MD [address], [place], [place] [address] [phone] Mon-Fri 8:00-5:00 Babies seen by [facility] providers Does NOT accept Medicaid [facility] [place] Cameron, MD; MacDonald, MD; Michaels, PA; Nayak, MD [address] [place] [address], [place], [place] [address] [phone] Mon-Fri 8:00-5:00 After hours clinic ([address] [place], [place] [address]) [phone] Mon-Fri 5:00-8:00, Sat 12:00-6:00, Sun 10:00-4:00 Babies seen by [facility] providers Accepting Medicaid [facility] [place] [address], [place], [place]  [address] [phone]   Fax - [phone]  [place] ([address]) [facility] [place] [facility] Andy, MD [address], [place], [place] [address] [phone] Mon-Fri 8:00-5:00 Babies seen by [facility] providers Does NOT accept Medicaid [facility] [place] ([facility] [place]) Eksir, MD [address], [place], [place] [address] [phone] Mon-Thur 8:00-7:00, Fri 8:00-5:00, Sat 8:00-12:00 Babies seen by providers at [facility] Accepting Medicaid - but does not have vaccinations in office (must be received elsewhere) Limited availability, please call early in hospitalization  [place] ([address]) [facility]  Charlene Flemming, MD [address], [place] [address] [phone]  Fax [phone]  

## 2021-10-30 NOTE — Progress Notes (Signed)
PRENATAL VISIT NOTE  Subjective:  Alicia Terry is a 31 y.o. G3P0110 at 38w6dbeing seen today for ongoing prenatal care.  She is currently monitored for the following issues for this high-risk pregnancy and has History of fetal demise, not currently pregnant; Iron deficiency anemia; Maternal morbid obesity, antepartum ([place]; Supervision of high risk pregnancy, antepartum; Alpha thalassemia silent carrier; Diabetes mellitus in pregnancy; and BMI 40.0-44.9, adult ([place] on their problem list.  Patient reports no complaints.  Contractions: Not present. Vag. Bleeding: None.  Movement: Present. Denies leaking of fluid.   The following portions of the patient's history were reviewed and updated as appropriate: allergies, current medications, past family history, past medical history, past social history, past surgical history and problem list.   Objective:   Vitals:   10/30/21 1621  BP: 127/83  Pulse: 98  Weight: 285 lb 9.6 oz (129.5 kg)    Fetal Status: Fetal Heart Rate (bpm): 155   Movement: Present     General:  Alert, oriented and cooperative. Patient is in no acute distress.  Skin: Skin is warm and dry. No rash noted.   Cardiovascular: Normal heart rate noted  Respiratory: Normal respiratory effort, no problems with respiration noted  Abdomen: Soft, gravid, appropriate for gestational age.  Pain/Pressure: Absent     Pelvic: Cervical exam deferred        Extremities: Normal range of motion.     Mental Status: Normal mood and affect. Normal behavior. Normal judgment and thought content.  [place]FM OB FOLLOW UP  Result Date: 10/20/2021 ----------------------------------------------------------------------  OBSTETRICS REPORT                       (Signed Final 10/20/2021 03:54 pm) ---------------------------------------------------------------------- Patient Info  ID #:       [id number]                         D.O.B.:  0Jan 19, 1992(31 yrs)  Name:       Alicia Terry               Visit Date: 10/20/2021 03:32 pm ---------------------------------------------------------------------- Performed By  Attending:        CSander Nephew     Ref. Address:     [address]                   [place]                                                             [place] Performed By:     AEveline Keto        [facility]                                                             [place] [facility]                                                             [facility]  Referred By:      [facility]                    for Women ---------------------------------------------------------------------- Orders  #  Description                           Code        Ordered By  1  [place] MFM OB FOLLOW UP                   [phone]    Peterson Ao ----------------------------------------------------------------------  #  Order #                     Accession #                Episode #  1  [id number]                   [id number]                 [id number] ---------------------------------------------------------------------- Indications  [redacted] weeks gestation of pregnancy                Z3A.23  Maternal morbid obesity (41.82)                O99.210 E66.01  Diabetes - Pregestational, 2nd trimester       O24.312  Poor obstetric history: Previous preterm       O09.219  delivery, antepartum (21w 4d IUFD)  NIPS LR female, AFP neg  [place], [facility], increased risk for  SMA (partner neg)  Antenatal follow-up for nonvisualized fetal    Z36.2  anatomy ---------------------------------------------------------------------- Vital Signs  BP:          113/62 ---------------------------------------------------------------------- Fetal Evaluation  Num Of Fetuses:         1  Fetal Heart Rate(bpm):  167  Cardiac Activity:       Observed  Presentation:           Breech  Placenta:                Anterior  P. Cord Insertion:      Previously Visualized  Amniotic Fluid  AFI FV:      Within normal limits                              Largest Pocket(cm)                              5.74 ---------------------------------------------------------------------- Biometry  BPD:     59.56  mm  G. Age:  24w 2d         76  %    CI:         74.4   %    70 - 86                                                          FL/HC:      18.6   %    19.2 - 20.8  HC:    219.19   mm     G. Age:  24w 0d         55  %    HC/AC:      1.16        1.05 - 1.21  AC:    188.74   mm     G. Age:  23w 4d         48  %    FL/BPD:     68.3   %    71 - 87  FL:       40.7  mm     G. Age:  23w 1d         30  %    FL/AC:      21.6   %    20 - 24  CER:      26.8  mm     G. Age:  24w 0d         86  %  LV:        4.5  mm  CM:        5.6  mm  Est. FW:     603  gm      1 lb 5 oz     47  % ---------------------------------------------------------------------- OB History  Gravidity:    3         Prem:   1  Ectopic:      1        Living:  0 ---------------------------------------------------------------------- Gestational Age  LMP:           23w 3d        Date:  05/09/21                   EDD:   02/13/22  U/S Today:     23w 5d                                        EDD:   02/11/22  Best:          23w 3d     Det. By:  LMP  (05/09/21)          EDD:   02/13/22 ---------------------------------------------------------------------- Anatomy  Cranium:               Appears normal         Aortic Arch:            Previously seen  Cavum:                 Appears normal         Ductal Arch:            Appears normal  Ventricles:            Appears normal         Diaphragm:              Appears normal  Choroid Plexus:        Previously seen        Stomach:                Appears normal, left                                                                        sided  Cerebellum:            Appears normal         Abdomen:                Appears normal  Posterior  Fossa:       Appears normal         Abdominal Wall:         Previously seen  Nuchal Fold:           Previously seen        Cord Vessels:           Previously seen  Face:                  Orbits prev seen,      Kidneys:                Appear normal                         Profile WNL  Lips:                  Previously seen        Bladder:                Appears normal  Thoracic:              Appears normal         Spine:                  Previously seen  Heart:                 Appears normal         Upper Extremities:      Previously seen                         (4CH, axis, and                         situs)  RVOT:                  Appears normal         Lower Extremities:      Previously seen  LVOT:                  Appears normal  Other:  Heels/feet and open hands/5th digits previously visualized. Nasal          bone, lenses, maxilla, mandible and falx previously visualized. 3VV  and 3VTV visualized. ---------------------------------------------------------------------- Cervix Uterus Adnexa  Cervix  Length:            3.9  cm.  Normal appearance by transabdominal scan.  Uterus  No abnormality visualized.  Right Ovary  Not visualized.  Left Ovary  Not visualized. ---------------------------------------------------------------------- Impression  Follow up growth to complete the fetal anatomy.  Normal interval growth with measurements consistent with  dates  Good fetal movement and amniotic fluid volume  Suboptimal views of the fetal anatomy due to fetal position. ---------------------------------------------------------------------- Recommendations  Follow up growth in 4 weeks and 8 weeks.  Initiate weekly testing at 34 weeks. ----------------------------------------------------------------------              Sander Nephew, MD Electronically Signed Final Report   10/20/2021 03:54 pm ----------------------------------------------------------------------   Assessment and Plan:  Pregnancy: [id number] at  66w6d1. Diabetes mellitus during pregnancy in second trimester Blood sugars within range, continue diet control Fetal ECHO appointment to be made Eye exam scheduled next month Continue MFM scans   2. Maternal morbid obesity, antepartum (HCC) TWG 10 lbs  3. History of fetal demise at 247 weeks not currently pregnant Doing well  4. Supervision of high risk pregnancy, antepartum Preterm labor symptoms and general obstetric precautions including but not limited to vaginal bleeding, contractions, leaking of fluid and fetal movement were reviewed in detail with the patient. Please refer to After Visit Summary for other counseling recommendations.   Return in about 3 weeks (around 11/20/2021) for OFFICE OB VISIT (MD only), 3rd trimester labs (no GTT, already has DM).  Future Appointments  Date Time Provider DGrayson 11/20/2021  3:30 [facility] 11/20/2021  3:45 PM [facility] 12/15/2021  1:15 PM [facility] NURSE [facility] 12/15/2021  1:30 PM [facility] 01/12/2022  1:40 PM Pemberton, HGreer Ee MD [facility] None    UVerita Schneiders MD

## 2021-11-05 ENCOUNTER — Other Ambulatory Visit: Payer: Self-pay

## 2021-11-05 ENCOUNTER — Inpatient Hospital Stay (HOSPITAL_COMMUNITY)
Admission: AD | Admit: 2021-11-05 | Discharge: 2021-11-05 | Disposition: A | Discharge status: Home / Self Care | Payer: Medicaid Other | Attending: Obstetrics and Gynecology | Admitting: Obstetrics and Gynecology

## 2021-11-05 ENCOUNTER — Encounter (HOSPITAL_COMMUNITY): Payer: Self-pay | Admitting: Obstetrics and Gynecology

## 2021-11-05 DIAGNOSIS — O23592 Infection of other part of genital tract in pregnancy, second trimester: Secondary | ICD-10-CM | POA: Insufficient documentation

## 2021-11-05 DIAGNOSIS — R109 Unspecified abdominal pain: Secondary | ICD-10-CM | POA: Diagnosis not present

## 2021-11-05 DIAGNOSIS — O09292 Supervision of pregnancy with other poor reproductive or obstetric history, second trimester: Secondary | ICD-10-CM | POA: Diagnosis not present

## 2021-11-05 DIAGNOSIS — Z3A25 25 weeks gestation of pregnancy: Secondary | ICD-10-CM | POA: Diagnosis not present

## 2021-11-05 DIAGNOSIS — O26892 Other specified pregnancy related conditions, second trimester: Secondary | ICD-10-CM | POA: Insufficient documentation

## 2021-11-05 DIAGNOSIS — B9689 Other specified bacterial agents as the cause of diseases classified elsewhere: Secondary | ICD-10-CM | POA: Insufficient documentation

## 2021-11-05 DIAGNOSIS — Z0371 Encounter for suspected problem with amniotic cavity and membrane ruled out: Secondary | ICD-10-CM

## 2021-11-05 DIAGNOSIS — N760 Acute vaginitis: Secondary | ICD-10-CM

## 2021-11-05 DIAGNOSIS — R102 Pelvic and perineal pain: Secondary | ICD-10-CM | POA: Insufficient documentation

## 2021-11-05 LAB — URINALYSIS, ROUTINE W REFLEX MICROSCOPIC
Bilirubin Urine: NEGATIVE
Glucose, UA: NEGATIVE mg/dL
Hgb urine dipstick: NEGATIVE
Ketones, ur: NEGATIVE mg/dL
Leukocytes,Ua: NEGATIVE
Nitrite: NEGATIVE
Protein, ur: 30 mg/dL — AB
Specific Gravity, Urine: 1.031 — ABNORMAL HIGH (ref 1.005–1.030)
pH: 5 (ref 5.0–8.0)

## 2021-11-05 LAB — WET PREP, GENITAL
Sperm: NONE SEEN
Trich, Wet Prep: NONE SEEN
WBC, Wet Prep HPF POC: >=10 — AB (ref <10)
Yeast Wet Prep HPF POC: NONE SEEN

## 2021-11-05 LAB — AMNISURE RUPTURE OF MEMBRANE (ROM) NOT AT ARMC: Amnisure ROM: NEGATIVE

## 2021-11-05 MED ORDER — METRONIDAZOLE 500 MG PO TABS: 500.0000 mg | ORAL_TABLET | Freq: Two times a day (BID) | ORAL | 0 refills | Status: DC

## 2021-11-05 NOTE — MAU Provider Note (Signed)
History     CSN: [id number]  Arrival date and time: 11/05/21 2052   Event Date/Time   First Provider Initiated Contact with Patient 11/05/21 2119      Chief Complaint  Patient presents with   Vaginal Discharge   Contractions   Alicia Terry is a 31 y.o. G3P0110 at 38w5dwho presents today with clear discharge. She reports that she has been leaking clear discharge all day today. She did not have a large gush of fluid at any point it has mostly been leaking throughout the day. She also feels some cramping that she feels may be contractions. She states that she has felt this sensation about every 30 mins for the last few hours. She states that she has felt less fetal movement today than typical however now she is starting to feel the baby move normally since being here on the monitor.   Vaginal Discharge The patient's primary symptoms include pelvic pain and vaginal discharge. This is a new problem. The current episode started today. The problem occurs intermittently. The problem has been unchanged. The pain is mild. The problem affects both sides. She is pregnant. The vaginal discharge was watery. There has been no bleeding. She has not been passing clots. She has not been passing tissue. Nothing aggravates the symptoms. She has tried nothing for the symptoms. Sexual activity: denies intercourse in the last 24 hours.    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   pos HSV 1 IgG    Past Surgical History:  Procedure Laterality Date   CYST EXCISION     Chest   WISDOM TOOTH EXTRACTION      Family History  Problem Relation Age of Onset   Diabetes Mother     Hypertension Mother    Healthy Father    Diabetes Sister    Heart disease Maternal Grandmother    Hypertension Maternal Grandmother    Depression Maternal Grandmother    Heart disease Maternal Grandfather    Hypertension Maternal Grandfather    Depression Maternal Grandfather    Dementia Paternal Grandmother     Social History   Tobacco Use   Smoking status: Never   Smokeless tobacco: Never  Vaping Use   Vaping Use: Never used  Substance Use Topics   Alcohol use: Not Currently   Drug use: Not Currently    Types: Marijuana    Allergies: No Known Allergies  Medications Prior to Admission  Medication Sig Dispense Refill Last Dose   Accu-Chek Softclix Lancets lancets Use as instructed; check blood glucose 4 times daily 100 each 12    aspirin EC 81 MG tablet Take 1 tablet (81 mg total) by mouth daily. Take after 12 weeks for prevention of preeclampsia later in pregnancy 300 tablet 2    Blood Glucose Monitoring Suppl (ACCU-CHEK GUIDE ME) w/Device KIT See admin instructions.      Blood Pressure  Monitoring (BLOOD PRESSURE KIT) DEVI 1 Device by Does not apply route once a week. 1 each 0    Cholecalciferol (VITAMIN D3 GUMMIES ADULT PO) Take by mouth.      ferrous sulfate 325 (65 FE) MG tablet Take 1 tablet (325 mg total) by mouth daily.  3    glucose blood (ACCU-CHEK GUIDE) test strip Use as instructed; check blood glucose 4 times daily 100 each 11    Prenatal Vit-Fe Fumarate-FA (MULTIVITAMIN-PRENATAL) 27-0.8 MG TABS tablet Take 1 tablet by mouth daily at 12 noon.       Review of Systems  Genitourinary:  Positive for pelvic pain and vaginal discharge.  All other systems reviewed and are negative.  Physical Exam   Temperature 98.4 F (36.9 C), resp. rate 20, last menstrual period 05/09/2021, SpO2 97 %.  Physical Exam Constitutional:      Appearance: She is well-developed.  HENT:     Head: Normocephalic.  Eyes:     Pupils: Pupils are equal, round, and reactive to light.   Cardiovascular:     Rate and Rhythm: Normal rate and regular rhythm.     Heart sounds: Normal heart sounds.  Pulmonary:     Effort: Pulmonary effort is normal. No respiratory distress.     Breath sounds: Normal breath sounds.  Abdominal:     Palpations: Abdomen is soft.     Tenderness: There is no abdominal tenderness.  Genitourinary:    Vagina: No bleeding. Vaginal discharge: mucusy.    Comments: External: no lesion Vagina: small amount of milky, white discharge Dilation: Closed Exam by:: Marcille Buffy, CNM      Musculoskeletal:        General: Normal range of motion.     Cervical back: Normal range of motion and neck supple.  Skin:    General: Skin is warm and dry.  Neurological:     Mental Status: She is alert and oriented to person, place, and time.  Psychiatric:        Mood and Affect: Mood normal.        Behavior: Behavior normal.    NST:  Baseline: 145 Variability: moderate Accels: 10x10 Decels: none Toco: none Reactive/Appropriate for GA   Results for orders placed or performed during the hospital encounter of 11/05/21 (from the past 24 hour(s))  Urinalysis, Routine w reflex microscopic     Status: Abnormal   Collection Time: 11/05/21  9:00 PM  Result Value Ref Range   Color, Urine YELLOW YELLOW   APPearance HAZY (A) CLEAR   Specific Gravity, Urine 1.031 (H) 1.005 - 1.030   pH 5.0 5.0 - 8.0   Glucose, UA NEGATIVE NEGATIVE mg/dL   Hgb urine dipstick NEGATIVE NEGATIVE   Bilirubin Urine NEGATIVE NEGATIVE   Ketones, ur NEGATIVE NEGATIVE mg/dL   Protein, ur 30 (A) NEGATIVE mg/dL   Nitrite NEGATIVE NEGATIVE   Leukocytes,Ua NEGATIVE NEGATIVE   RBC / HPF 0-5 0 - 5 RBC/hpf   WBC, UA 0-5 0 - 5 WBC/hpf   Bacteria, UA RARE (A) NONE SEEN   Squamous Epithelial / LPF 0-5 0 - 5   Mucus PRESENT    Hyaline Casts, UA PRESENT   Amnisure rupture of membrane (rom)not at [facility]     Status: None   Collection Time: 11/05/21  9:28 PM  Result Value Ref Range   Amnisure  ROM NEGATIVE   Wet prep, genital     Status: Abnormal   Collection Time: 11/05/21  9:28 PM  Result Value  Ref Range   Yeast Wet Prep HPF POC NONE SEEN NONE SEEN   Trich, Wet Prep NONE SEEN NONE SEEN   Clue Cells Wet Prep HPF POC PRESENT (A) NONE SEEN   WBC, Wet Prep HPF POC >=10 (A) <10   Sperm NONE SEEN      MAU Course  Procedures  MDM   Assessment and Plan   1. Encounter for suspected premature rupture of membranes, with rupture of membranes not found   2. Bacterial vaginosis   3. [redacted] weeks gestation of pregnancy    DC home in stable condition  Comfort measures reviewed  2nd/3rd Trimester precautions  PTL precautions  Fetal kick counts RX: flagyl 517m BID x 7 days  Return to MAU as needed FU with OB as planned   [facility].   Specialty: Obstetrics and Gynecology Contact information: [id number] HeightsDNP, CNM  11/05/21  10:10 PM

## 2021-11-05 NOTE — MAU Note (Signed)
.  Alicia Terry is a 31 y.o. at 27w5dhere in MAU reporting: Pt reports abdominal cramping since this morning. Pt reports clear discharge at 1200 when she wiped, none since.   Onset of complaint: today  Pain score: 10/10 intermittent There were no vitals filed for this visit.    Lab orders placed from triage:   ua

## 2021-11-06 LAB — GC/CHLAMYDIA PROBE AMP (~~LOC~~) NOT AT ARMC
Chlamydia: NEGATIVE
Comment: NEGATIVE
Comment: NORMAL
Neisseria Gonorrhea: NEGATIVE

## 2021-11-07 ENCOUNTER — Telehealth: Payer: Self-pay | Admitting: General Practice

## 2021-11-07 ENCOUNTER — Inpatient Hospital Stay (HOSPITAL_COMMUNITY)
Admission: AD | Admit: 2021-11-07 | Discharge: 2021-11-07 | Disposition: A | Discharge status: Home / Self Care | Payer: Medicaid Other | Attending: Obstetrics and Gynecology | Admitting: Obstetrics and Gynecology

## 2021-11-07 ENCOUNTER — Encounter (HOSPITAL_COMMUNITY): Payer: Self-pay | Admitting: Obstetrics and Gynecology

## 2021-11-07 DIAGNOSIS — O26893 Other specified pregnancy related conditions, third trimester: Secondary | ICD-10-CM | POA: Insufficient documentation

## 2021-11-07 DIAGNOSIS — O26892 Other specified pregnancy related conditions, second trimester: Secondary | ICD-10-CM

## 2021-11-07 DIAGNOSIS — R109 Unspecified abdominal pain: Secondary | ICD-10-CM | POA: Diagnosis not present

## 2021-11-07 DIAGNOSIS — Z3A26 26 weeks gestation of pregnancy: Secondary | ICD-10-CM | POA: Diagnosis not present

## 2021-11-07 DIAGNOSIS — M791 Myalgia, unspecified site: Secondary | ICD-10-CM | POA: Diagnosis not present

## 2021-11-07 LAB — URINALYSIS, ROUTINE W REFLEX MICROSCOPIC
Bilirubin Urine: NEGATIVE
Glucose, UA: NEGATIVE mg/dL
Hgb urine dipstick: NEGATIVE
Ketones, ur: NEGATIVE mg/dL
Leukocytes,Ua: NEGATIVE
Nitrite: NEGATIVE
Protein, ur: NEGATIVE mg/dL
Specific Gravity, Urine: 1.02 (ref 1.005–1.030)
pH: 6 (ref 5.0–8.0)

## 2021-11-07 MED ORDER — ALUM & MAG HYDROXIDE-SIMETH 200-200-20 MG/5ML PO SUSP
30.0000 mL | Freq: Once | ORAL | Status: AC
  Administered 2021-11-07: 30 mL via ORAL
  Filled 2021-11-07: qty 30

## 2021-11-07 MED ORDER — CYCLOBENZAPRINE HCL 5 MG PO TABS
10.0000 mg | ORAL_TABLET | Freq: Once | ORAL | Status: AC
  Administered 2021-11-07: 10 mg via ORAL
  Filled 2021-11-07: qty 2

## 2021-11-07 MED ORDER — CYCLOBENZAPRINE HCL 10 MG PO TABS: 10.0000 mg | ORAL_TABLET | Freq: Three times a day (TID) | ORAL | 2 refills | Status: DC | PRN

## 2021-11-07 NOTE — MAU Note (Signed)
.  Alicia Terry is a 31 y.o. at 42w0dhere in MAU reporting: she was seen two days ago with lower abd pain and pain just above her umbilicus. Was told she had bacteria in her wet prep and started antibiotics. The pain is worsening and she called the office and they told her to come here. Reports positive fetal movement. Pain is constant.   Onset of complaint: 3 days ago Pain score: 10/10 Vitals:   11/07/21 1129  BP: 113/68  Pulse: 94  Resp: 18  Temp: 98 F (36.7 C)  SpO2: 98%     FHT:159 Lab orders placed from triage:urine

## 2021-11-07 NOTE — MAU Provider Note (Signed)
Patient Alicia Terry is a 31 y.o. G3P0110;  At 70w0dhere with complaints of on-going "stomach pain". She reports that she was in MAU 2 days ago for the same pain; she was diagnosed with BV and had a NST which was reactive. She started taking her medicine on Monday. She reports that she is tolerating the medicine.    She denies VB, LOF, contraction. She endorses strong fetal movements. She denies nausea, vomiting, diarrhea, fever, SOB.   Yesterday she had chicken for lunch and fruit in the morning; this morning she had tKuwaitsausage and hash browns.   Patient states that she "got worried" because of her history and so she came in to be seen. She states that she thinks it is a muscle ache and that its just the baby growing. She does not think anything is wrong with her baby at this moment.  History     CSN: [id number] Arrival date and time: 11/07/21 1113   Event Date/Time   First Provider Initiated Contact with Patient 11/07/21 1200      Chief Complaint  Patient presents with   Pelvic Pain   Abdominal Pain This is a new problem. The current episode started in the past 7 days. The problem has been unchanged. The pain is at a severity of 8/10 (its 8 when she lays there, its a 10 when she stands up). The abdominal pain does not radiate. Pertinent negatives include no constipation, diarrhea, dysuria, fever, nausea or vomiting.   She reports that it hurts more when she stands up and when she moves.  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   pos HSV 1 IgG    Past Surgical History:  Procedure Laterality Date   CYST EXCISION     Chest   WISDOM TOOTH EXTRACTION       Family History  Problem Relation Age of Onset   Diabetes Mother    Hypertension Mother    Healthy Father    Diabetes Sister    Heart disease Maternal Grandmother    Hypertension Maternal Grandmother    Depression Maternal Grandmother    Heart disease Maternal Grandfather    Hypertension Maternal Grandfather    Depression Maternal Grandfather    Dementia Paternal Grandmother     Social History   Tobacco Use   Smoking status: Never   Smokeless tobacco: Never  Vaping Use   Vaping Use: Never used  Substance Use Topics   Alcohol use: Not Currently   Drug use: Not Currently    Types: Marijuana    Allergies: No Known Allergies  No medications prior to admission.    Review of Systems  Constitutional:  Negative for fever.  Gastrointestinal:  Positive for abdominal pain. Negative for constipation, diarrhea, nausea and vomiting.  Genitourinary:  Negative for dysuria.   Physical Exam   Blood pressure 119/69, pulse (!) 102, temperature 98  F (36.7 C), temperature source Oral, resp. rate 18, height '5\' 8"'$  (1.727 m), weight 130.2 kg, last menstrual period 05/09/2021, SpO2 99 %.  Physical Exam Constitutional:      Appearance: Normal appearance.  Cardiovascular:     Rate and Rhythm: Normal rate.  Pulmonary:     Effort: Pulmonary effort is normal.  Abdominal:     General: Abdomen is flat. There is no distension.     Palpations: Abdomen is soft.     Tenderness: There is no abdominal tenderness. There is no guarding or rebound.     Hernia: No hernia is present.  Skin:    General: Skin is warm.  Neurological:     General: No focal deficit present.     Mental Status: She is alert.  Psychiatric:        Mood and Affect: Mood normal.     MAU Course  Procedures  MDM -NST: 140 bpm, mod var, present acel, no decel, no contractions -patient had GI cocktail and flexeril, reports that she feels better. She states that she is standing up and walking without pain, which is  new for her.  -reviewed that patient should continue taking her flagyl -abdomen is soft, non-tender, no signs of bruising or her Assessment and Plan   1. Muscle pain   2. Abdominal pain in pregnancy, second trimester   3. [redacted] weeks gestation of pregnancy    -patient safe for discharge with RX for flexeril, instructed to return to MAU if contractions, LOF, VB, concerns for fetal status -recommended that she take flexeril is needed  Alicia Terry 11/07/2021, 4:42 PM

## 2021-11-07 NOTE — Telephone Encounter (Signed)
Patient called into front office wanting an urgent appointment in the office for pain. Patient reports being in MAU 2 days ago and was told everything was fine but she is still having pain. She reports the pain is a 8/10 laying down and gets worse when she changes position. She states the pain is right above her belly button and hurts if she touches that spot. Patient states nothing makes the pain better. Reviewed patient's history with [place] who recommends patient go to MAU rather than office appt. Discussed with patient. Patient verbalized understanding.

## 2021-11-20 ENCOUNTER — Other Ambulatory Visit: Payer: Self-pay

## 2021-11-20 ENCOUNTER — Ambulatory Visit: Payer: Medicaid Other | Admitting: *Deleted

## 2021-11-20 ENCOUNTER — Ambulatory Visit (INDEPENDENT_AMBULATORY_CARE_PROVIDER_SITE_OTHER): Payer: Medicaid Other | Admitting: Obstetrics & Gynecology

## 2021-11-20 ENCOUNTER — Ambulatory Visit: Payer: Medicaid Other | Attending: Maternal & Fetal Medicine

## 2021-11-20 VITALS — BP 130/70 | HR 110

## 2021-11-20 VITALS — BP 125/83 | HR 103 | Wt 283.4 lb

## 2021-11-20 DIAGNOSIS — Z362 Encounter for other antenatal screening follow-up: Secondary | ICD-10-CM | POA: Insufficient documentation

## 2021-11-20 DIAGNOSIS — Z23 Encounter for immunization: Secondary | ICD-10-CM | POA: Diagnosis not present

## 2021-11-20 DIAGNOSIS — O99212 Obesity complicating pregnancy, second trimester: Secondary | ICD-10-CM | POA: Diagnosis not present

## 2021-11-20 DIAGNOSIS — Z3A27 27 weeks gestation of pregnancy: Secondary | ICD-10-CM

## 2021-11-20 DIAGNOSIS — O0990 Supervision of high risk pregnancy, unspecified, unspecified trimester: Secondary | ICD-10-CM

## 2021-11-20 DIAGNOSIS — O24912 Unspecified diabetes mellitus in pregnancy, second trimester: Secondary | ICD-10-CM | POA: Diagnosis not present

## 2021-11-20 DIAGNOSIS — E669 Obesity, unspecified: Secondary | ICD-10-CM

## 2021-11-20 DIAGNOSIS — O09292 Supervision of pregnancy with other poor reproductive or obstetric history, second trimester: Secondary | ICD-10-CM | POA: Diagnosis not present

## 2021-11-20 DIAGNOSIS — O24410 Gestational diabetes mellitus in pregnancy, diet controlled: Secondary | ICD-10-CM | POA: Diagnosis not present

## 2021-11-20 DIAGNOSIS — O3660X0 Maternal care for excessive fetal growth, unspecified trimester, not applicable or unspecified: Secondary | ICD-10-CM | POA: Diagnosis not present

## 2021-11-20 NOTE — Patient Instructions (Signed)
Return to office for any scheduled appointments. Call the office or go to the MAU at [facility] if: You begin to have strong, frequent contractions Your water breaks.  Sometimes it is a big gush of fluid, sometimes it is just a trickle that keeps getting your underwear wet or running down your legs You have vaginal bleeding.  It is normal to have a small amount of spotting if your cervix was checked.  You do not feel your baby moving like normal.  If you do not, get something to eat and drink and lay down and focus on feeling your baby move.   If your baby is still not moving like normal, you should call the office or go to MAU. Any other obstetric concerns.  

## 2021-11-20 NOTE — Progress Notes (Signed)
   PRENATAL VISIT NOTE  Subjective:  Alicia Terry is a 31 y.o. G3P0110 at 76w6dbeing seen today for ongoing prenatal care.  She is currently monitored for the following issues for this high-risk pregnancy and has History of fetal demise, not currently pregnant; Iron deficiency anemia; Maternal morbid obesity, antepartum ([place]; Supervision of high risk pregnancy, antepartum; Alpha thalassemia silent carrier; Diabetes mellitus in pregnancy; and BMI 40.0-44.9, adult ([place] on their problem list.  Patient reports no complaints.  Contractions: Not present. Vag. Bleeding: None.  Movement: Present. Denies leaking of fluid.   The following portions of the patient's history were reviewed and updated as appropriate: allergies, current medications, past family history, past medical history, past social history, past surgical history and problem list.   Objective:   Vitals:   11/20/21 1416  BP: 125/83  Pulse: (!) 103  Weight: 283 lb 6.2 oz (128.5 kg)    Fetal Status: Fetal Heart Rate (bpm): 155   Movement: Present     General:  Alert, oriented and cooperative. Patient is in no acute distress.  Skin: Skin is warm and dry. No rash noted.   Cardiovascular: Normal heart rate noted  Respiratory: Normal respiratory effort, no problems with respiration noted  Abdomen: Soft, gravid, appropriate for gestational age.  Pain/Pressure: Present     Pelvic: Cervical exam deferred        Extremities: Normal range of motion.     Mental Status: Normal mood and affect. Normal behavior. Normal judgment and thought content.   Assessment and Plan:  Pregnancy: G3P0110 at 271w6d. Diabetes mellitus during pregnancy in second trimester, unspecified diabetes mellitus type 2. [redacted] weeks gestation of pregnancy 3. Supervision of high risk pregnancy, antepartum Fasting BS this morning- 82, range up to 95.  Continue diet control.  Continue scans as per MFM, scan scheduled today.  Third trimester labs, Tdap today. - HIV  Antibody (routine testing w rflx) - CBC - RPR - Hemoglobin A1c - Comprehensive metabolic panel Preterm labor symptoms and general obstetric precautions including but not limited to vaginal bleeding, contractions, leaking of fluid and fetal movement were reviewed in detail with the patient. Please refer to After Visit Summary for other counseling recommendations.   Return in about 2 weeks (around 12/04/2021) for OFFICE OB VISIT (MD only).  Future Appointments  Date Time Provider DeHenning10/16/2023  3:30 PM [facility]10/16/2023  3:45 PM [facility]-MFC US1 [facility]11/11/2021  1:15 PM [facility] NURSE [facility]11/11/2021  1:30 PM [facility] US2 [facility]12/09/2021  1:40 PM [place], HeGreer EeMD CVD-WMC None    UgVerita SchneidersMD

## 2021-11-21 LAB — COMPREHENSIVE METABOLIC PANEL
ALT: 15 IU/L (ref 0–32)
AST: 15 IU/L (ref 0–40)
Albumin/Globulin Ratio: 1.4 (ref 1.2–2.2)
Albumin: 3.7 g/dL — ABNORMAL LOW (ref 3.9–4.9)
Alkaline Phosphatase: 72 IU/L (ref 44–121)
BUN/Creatinine Ratio: 10 (ref 9–23)
BUN: 7 mg/dL (ref 6–20)
Bilirubin Total: <0.2 mg/dL (ref 0.0–1.2)
CO2: 18 mmol/L — ABNORMAL LOW (ref 20–29)
Calcium: 9.5 mg/dL (ref 8.7–10.2)
Chloride: 102 mmol/L (ref 96–106)
Creatinine, Ser: 0.67 mg/dL (ref 0.57–1.00)
Globulin, Total: 2.6 g/dL (ref 1.5–4.5)
Glucose: 197 mg/dL — ABNORMAL HIGH (ref 70–99)
Potassium: 4 mmol/L (ref 3.5–5.2)
Sodium: 137 mmol/L (ref 134–144)
Total Protein: 6.3 g/dL (ref 6.0–8.5)
eGFR: 118 mL/min/{1.73_m2} (ref >59)

## 2021-11-21 LAB — CBC
Hematocrit: 34.9 % (ref 34.0–46.6)
Hemoglobin: 11.7 g/dL (ref 11.1–15.9)
MCH: 27.1 pg (ref 26.6–33.0)
MCHC: 33.5 g/dL (ref 31.5–35.7)
MCV: 81 fL (ref 79–97)
Platelets: 262 10*3/uL (ref 150–450)
RBC: 4.32 x10E6/uL (ref 3.77–5.28)
RDW: 12.8 % (ref 11.7–15.4)
WBC: 7 10*3/uL (ref 3.4–10.8)

## 2021-11-21 LAB — RPR: RPR Ser Ql: NONREACTIVE

## 2021-11-21 LAB — HEMOGLOBIN A1C
Est. average glucose Bld gHb Est-mCnc: 140 mg/dL
Hgb A1c MFr Bld: 6.5 % — ABNORMAL HIGH (ref 4.8–5.6)

## 2021-11-21 LAB — HIV ANTIBODY (ROUTINE TESTING W REFLEX): HIV Screen 4th Generation wRfx: NONREACTIVE

## 2021-12-05 ENCOUNTER — Ambulatory Visit (INDEPENDENT_AMBULATORY_CARE_PROVIDER_SITE_OTHER): Payer: Medicaid Other | Admitting: Family Medicine

## 2021-12-05 ENCOUNTER — Other Ambulatory Visit: Payer: Self-pay

## 2021-12-05 ENCOUNTER — Encounter: Payer: Self-pay | Admitting: Family Medicine

## 2021-12-05 VITALS — BP 106/71 | HR 112 | Wt 288.0 lb

## 2021-12-05 DIAGNOSIS — O0990 Supervision of high risk pregnancy, unspecified, unspecified trimester: Secondary | ICD-10-CM

## 2021-12-05 DIAGNOSIS — O24113 Pre-existing diabetes mellitus, type 2, in pregnancy, third trimester: Secondary | ICD-10-CM

## 2021-12-05 DIAGNOSIS — Z8759 Personal history of other complications of pregnancy, childbirth and the puerperium: Secondary | ICD-10-CM

## 2021-12-05 DIAGNOSIS — O0993 Supervision of high risk pregnancy, unspecified, third trimester: Secondary | ICD-10-CM

## 2021-12-05 DIAGNOSIS — Z3A30 30 weeks gestation of pregnancy: Secondary | ICD-10-CM

## 2021-12-05 NOTE — Progress Notes (Signed)
   Subjective:  Alicia Terry is a 31 y.o. G3P0110 at 75w0dbeing seen today for ongoing prenatal care.  She is currently monitored for the following issues for this high-risk pregnancy and has History of fetal demise, not currently pregnant; Iron deficiency anemia; Maternal morbid obesity, antepartum ([place]; Supervision of high risk pregnancy, antepartum; Alpha thalassemia silent carrier; Diabetes mellitus in pregnancy; and BMI 40.0-44.9, adult ([place] on their problem list.  Patient reports no complaints.  Contractions: Not present. Vag. Bleeding: None.  Movement: Present. Denies leaking of fluid.   The following portions of the patient's history were reviewed and updated as appropriate: allergies, current medications, past family history, past medical history, past social history, past surgical history and problem list. Problem list updated.  Objective:   Vitals:   12/05/21 1027  BP: 106/71  Pulse: (!) 112  Weight: 288 lb (130.6 kg)    Fetal Status: Fetal Heart Rate (bpm): 145   Movement: Present     General:  Alert, oriented and cooperative. Patient is in no acute distress.  Skin: Skin is warm and dry. No rash noted.   Cardiovascular: Normal heart rate noted  Respiratory: Normal respiratory effort, no problems with respiration noted  Abdomen: Soft, gravid, appropriate for gestational age. Pain/Pressure: Absent     Pelvic: Vag. Bleeding: None     Cervical exam deferred        Extremities: Normal range of motion.     Mental Status: Normal mood and affect. Normal behavior. Normal judgment and thought content.   Urinalysis:      Assessment and Plan:  Pregnancy: G3P0110 at 329w0d1. Supervision of high risk pregnancy, antepartum BP and FHR normal  2. Pre-existing type 2 diabetes mellitus during pregnancy in third trimester Forgot log On recall fastings are always below 90, post prandials are always between 105-115 Continue diet control Following w MFM, macrosomia on last  visit, may need to start antenatal testing based on next growth [place]. History of fetal demise, not currently pregnant Does not meet critera for antenatal testing  Preterm labor symptoms and general obstetric precautions including but not limited to vaginal bleeding, contractions, leaking of fluid and fetal movement were reviewed in detail with the patient. Please refer to After Visit Summary for other counseling recommendations.  Return in 2 weeks (on 12/19/2021) [facility], needs MD.   EcClarnce FlockMD

## 2021-12-05 NOTE — Patient Instructions (Signed)

## 2021-12-07 DIAGNOSIS — O24812 Other pre-existing diabetes mellitus in pregnancy, second trimester: Secondary | ICD-10-CM | POA: Diagnosis not present

## 2021-12-15 ENCOUNTER — Ambulatory Visit: Payer: Medicaid Other | Admitting: *Deleted

## 2021-12-15 ENCOUNTER — Ambulatory Visit: Payer: Medicaid Other | Attending: Obstetrics and Gynecology

## 2021-12-15 ENCOUNTER — Other Ambulatory Visit: Payer: Self-pay | Admitting: *Deleted

## 2021-12-15 VITALS — BP 127/67 | HR 99

## 2021-12-15 DIAGNOSIS — O09293 Supervision of pregnancy with other poor reproductive or obstetric history, third trimester: Secondary | ICD-10-CM

## 2021-12-15 DIAGNOSIS — O09213 Supervision of pregnancy with history of pre-term labor, third trimester: Secondary | ICD-10-CM

## 2021-12-15 DIAGNOSIS — O3660X1 Maternal care for excessive fetal growth, unspecified trimester, fetus 1: Secondary | ICD-10-CM

## 2021-12-15 DIAGNOSIS — O24410 Gestational diabetes mellitus in pregnancy, diet controlled: Secondary | ICD-10-CM | POA: Diagnosis not present

## 2021-12-15 DIAGNOSIS — O99213 Obesity complicating pregnancy, third trimester: Secondary | ICD-10-CM

## 2021-12-15 DIAGNOSIS — O09893 Supervision of other high risk pregnancies, third trimester: Secondary | ICD-10-CM

## 2021-12-15 DIAGNOSIS — Z3A31 31 weeks gestation of pregnancy: Secondary | ICD-10-CM

## 2021-12-15 DIAGNOSIS — O3663X0 Maternal care for excessive fetal growth, third trimester, not applicable or unspecified: Secondary | ICD-10-CM | POA: Diagnosis not present

## 2021-12-15 DIAGNOSIS — E669 Obesity, unspecified: Secondary | ICD-10-CM | POA: Diagnosis not present

## 2021-12-15 DIAGNOSIS — O0990 Supervision of high risk pregnancy, unspecified, unspecified trimester: Secondary | ICD-10-CM | POA: Diagnosis not present

## 2021-12-15 DIAGNOSIS — Z362 Encounter for other antenatal screening follow-up: Secondary | ICD-10-CM | POA: Insufficient documentation

## 2021-12-15 DIAGNOSIS — O99212 Obesity complicating pregnancy, second trimester: Secondary | ICD-10-CM | POA: Insufficient documentation

## 2021-12-18 ENCOUNTER — Encounter: Payer: Self-pay | Admitting: Obstetrics and Gynecology

## 2021-12-18 ENCOUNTER — Ambulatory Visit (INDEPENDENT_AMBULATORY_CARE_PROVIDER_SITE_OTHER): Payer: Medicaid Other | Admitting: Obstetrics and Gynecology

## 2021-12-18 ENCOUNTER — Other Ambulatory Visit: Payer: Self-pay

## 2021-12-18 VITALS — BP 132/89 | HR 97 | Wt 294.0 lb

## 2021-12-18 DIAGNOSIS — Z3A31 31 weeks gestation of pregnancy: Secondary | ICD-10-CM

## 2021-12-18 DIAGNOSIS — O24113 Pre-existing diabetes mellitus, type 2, in pregnancy, third trimester: Secondary | ICD-10-CM

## 2021-12-18 DIAGNOSIS — Z6841 Body Mass Index (BMI) 40.0 and over, adult: Secondary | ICD-10-CM

## 2021-12-18 DIAGNOSIS — O0990 Supervision of high risk pregnancy, unspecified, unspecified trimester: Secondary | ICD-10-CM

## 2021-12-18 DIAGNOSIS — O0993 Supervision of high risk pregnancy, unspecified, third trimester: Secondary | ICD-10-CM

## 2021-12-18 NOTE — Patient Instructions (Signed)

## 2021-12-18 NOTE — Progress Notes (Signed)
Subjective:  Alicia Terry is a 31 y.o. G3P0110 at 71w6dbeing seen today for ongoing prenatal care.  She is currently monitored for the following issues for this high-risk pregnancy and has History of fetal demise, not currently pregnant; Iron deficiency anemia; Maternal morbid obesity, antepartum (HB[place]; Supervision of high risk pregnancy, antepartum; Alpha thalassemia silent carrier; Diabetes mellitus in pregnancy; and BMI 40.0-44.9, adult ([place] on their problem list.  Patient reports no complaints.  Contractions: Not present. Vag. Bleeding: None.  Movement: Present. Denies leaking of fluid.   The following portions of the patient's history were reviewed and updated as appropriate: allergies, current medications, past family history, past medical history, past social history, past surgical history and problem list. Problem list updated.  Objective:   Vitals:   12/18/21 1621  BP: 132/89  Pulse: 97  Weight: 294 lb (133.4 kg)    Fetal Status: Fetal Heart Rate (bpm): 150   Movement: Present     General:  Alert, oriented and cooperative. Patient is in no acute distress.  Skin: Skin is warm and dry. No rash noted.   Cardiovascular: Normal heart rate noted  Respiratory: Normal respiratory effort, no problems with respiration noted  Abdomen: Soft, gravid, appropriate for gestational age. Pain/Pressure: Present     Pelvic:  Cervical exam deferred        Extremities: Normal range of motion.     Mental Status: Normal mood and affect. Normal behavior. Normal judgment and thought content.   Urinalysis:      Assessment and Plan:  Pregnancy: G3P0110 at 378w6d1. Supervision of high risk pregnancy, antepartum Stable  2. Pre-existing type 2 diabetes mellitus during pregnancy in third trimester Stable Has not been able to check CBG's for the last few days Has misplaced glucose monitor. To let usKoreanow if she needs a new one Serial growth scans and antenatal testing as per MFM  3. BMI  40.0-44.9, adult (HCC) Stable  Preterm labor symptoms and general obstetric precautions including but not limited to vaginal bleeding, contractions, leaking of fluid and fetal movement were reviewed in detail with the patient. Please refer to After Visit Summary for other counseling recommendations.  Return in about 2 weeks (around 01/01/2022) for OB visit, face to face, MD only.   ErChancy MilroyMD

## 2021-12-19 ENCOUNTER — Encounter: Payer: Self-pay | Admitting: Obstetrics and Gynecology

## 2021-12-21 ENCOUNTER — Other Ambulatory Visit: Payer: Self-pay | Admitting: Lactation Services

## 2021-12-21 ENCOUNTER — Ambulatory Visit (HOSPITAL_BASED_OUTPATIENT_CLINIC_OR_DEPARTMENT_OTHER): Payer: Medicaid Other | Admitting: *Deleted

## 2021-12-21 ENCOUNTER — Ambulatory Visit: Payer: Medicaid Other | Attending: Obstetrics | Admitting: *Deleted

## 2021-12-21 VITALS — BP 132/87 | HR 103

## 2021-12-21 DIAGNOSIS — Z3A32 32 weeks gestation of pregnancy: Secondary | ICD-10-CM | POA: Insufficient documentation

## 2021-12-21 DIAGNOSIS — Z8751 Personal history of pre-term labor: Secondary | ICD-10-CM | POA: Insufficient documentation

## 2021-12-21 DIAGNOSIS — O0990 Supervision of high risk pregnancy, unspecified, unspecified trimester: Secondary | ICD-10-CM

## 2021-12-21 DIAGNOSIS — Z8759 Personal history of other complications of pregnancy, childbirth and the puerperium: Secondary | ICD-10-CM | POA: Diagnosis not present

## 2021-12-21 DIAGNOSIS — O99213 Obesity complicating pregnancy, third trimester: Secondary | ICD-10-CM | POA: Diagnosis not present

## 2021-12-21 DIAGNOSIS — O24410 Gestational diabetes mellitus in pregnancy, diet controlled: Secondary | ICD-10-CM

## 2021-12-21 MED ORDER — ACCU-CHEK GUIDE W/DEVICE KIT: 1.0000 | PACK | Freq: Once | 0 refills | Status: AC

## 2021-12-21 NOTE — Progress Notes (Signed)
Ordered replacement Glucometer at patient request as patients other one has been misplaced.

## 2021-12-21 NOTE — Procedures (Signed)
Alicia Terry April 15, 1990 [redacted]w[redacted]d Fetus A Non-Stress Test Interpretation for 12/21/21  Indication: Diabetes and obese  Fetal Heart Rate A Mode: External Baseline Rate (A): 155 bpm Variability: Moderate Accelerations: 15 x 15 Decelerations: Variable Multiple birth?: No  Uterine Activity Mode: Toco Contraction Frequency (min): none Resting Tone Palpated: Relaxed  Interpretation (Fetal Testing) Nonstress Test Interpretation: Reactive Overall Impression: Reassuring for gestational age Comments: tracing reviewed by Dr. FAnnamaria Boots

## 2021-12-26 ENCOUNTER — Ambulatory Visit: Payer: Medicaid Other | Attending: Obstetrics | Admitting: *Deleted

## 2021-12-26 ENCOUNTER — Ambulatory Visit (HOSPITAL_BASED_OUTPATIENT_CLINIC_OR_DEPARTMENT_OTHER): Payer: Medicaid Other | Admitting: *Deleted

## 2021-12-26 VITALS — BP 137/73 | HR 83

## 2021-12-26 DIAGNOSIS — Z8759 Personal history of other complications of pregnancy, childbirth and the puerperium: Secondary | ICD-10-CM

## 2021-12-26 DIAGNOSIS — E669 Obesity, unspecified: Secondary | ICD-10-CM | POA: Diagnosis present

## 2021-12-26 DIAGNOSIS — Z3A33 33 weeks gestation of pregnancy: Secondary | ICD-10-CM

## 2021-12-26 DIAGNOSIS — O99213 Obesity complicating pregnancy, third trimester: Secondary | ICD-10-CM

## 2021-12-26 DIAGNOSIS — O24419 Gestational diabetes mellitus in pregnancy, unspecified control: Secondary | ICD-10-CM | POA: Diagnosis not present

## 2021-12-26 DIAGNOSIS — O24410 Gestational diabetes mellitus in pregnancy, diet controlled: Secondary | ICD-10-CM

## 2021-12-26 DIAGNOSIS — O0990 Supervision of high risk pregnancy, unspecified, unspecified trimester: Secondary | ICD-10-CM

## 2021-12-26 NOTE — Procedures (Signed)
Alicia Terry 1990/02/27 [redacted]w[redacted]d Fetus A Non-Stress Test Interpretation for 12/26/21  Indication:  Hx IUFD, GDM-diet, Obesity  Fetal Heart Rate A Mode: External Baseline Rate (A): 135 bpm Variability: Moderate Accelerations: 15 x 15 Decelerations: None Multiple birth?: No  Uterine Activity Mode: Palpation Contraction Frequency (min): Occas Contraction Quality: Mild  Interpretation (Fetal Testing) Nonstress Test Interpretation: Reactive Comments: Dr. SDonalee Citrinreviewed tracing.

## 2022-01-01 ENCOUNTER — Encounter: Payer: Self-pay | Admitting: Obstetrics and Gynecology

## 2022-01-01 ENCOUNTER — Other Ambulatory Visit: Payer: Self-pay

## 2022-01-01 ENCOUNTER — Ambulatory Visit (INDEPENDENT_AMBULATORY_CARE_PROVIDER_SITE_OTHER): Payer: Medicaid Other | Admitting: Obstetrics and Gynecology

## 2022-01-01 VITALS — BP 138/86 | HR 105 | Wt 297.3 lb

## 2022-01-01 DIAGNOSIS — O24113 Pre-existing diabetes mellitus, type 2, in pregnancy, third trimester: Secondary | ICD-10-CM

## 2022-01-01 DIAGNOSIS — O3663X0 Maternal care for excessive fetal growth, third trimester, not applicable or unspecified: Secondary | ICD-10-CM

## 2022-01-01 DIAGNOSIS — O0990 Supervision of high risk pregnancy, unspecified, unspecified trimester: Secondary | ICD-10-CM

## 2022-01-01 DIAGNOSIS — Z3A33 33 weeks gestation of pregnancy: Secondary | ICD-10-CM

## 2022-01-01 DIAGNOSIS — O3660X0 Maternal care for excessive fetal growth, unspecified trimester, not applicable or unspecified: Secondary | ICD-10-CM | POA: Insufficient documentation

## 2022-01-01 MED ORDER — METFORMIN HCL 500 MG PO TABS: 500.0000 mg | ORAL_TABLET | Freq: Two times a day (BID) | ORAL | 5 refills | Status: DC

## 2022-01-01 NOTE — Progress Notes (Signed)
Fasting BS between 80-103, after meals 128-133.

## 2022-01-01 NOTE — Patient Instructions (Signed)

## 2022-01-01 NOTE — Progress Notes (Signed)
Subjective:  Alicia Terry is a 31 y.o. G3P0110 at 16w6dbeing seen today for ongoing prenatal care.  She is currently monitored for the following issues for this high-risk pregnancy and has History of fetal demise, not currently pregnant; Iron deficiency anemia; Maternal morbid obesity, antepartum ([place]; Supervision of high risk pregnancy, antepartum; Alpha thalassemia silent carrier; Diabetes mellitus in pregnancy; BMI 40.0-44.9, adult (HM[place]; and Macrosomia affecting management of mother on their problem list.  Patient reports  general discomforts of pregnancy .  Contractions: Not present. Vag. Bleeding: None.  Movement: Present. Denies leaking of fluid.   The following portions of the patient's history were reviewed and updated as appropriate: allergies, current medications, past family history, past medical history, past social history, past surgical history and problem list. Problem list updated.  Objective:   Vitals:   01/01/22 1629  BP: 138/86  Pulse: (!) 105  Weight: 297 lb 4.8 oz (134.9 kg)    Fetal Status: Fetal Heart Rate (bpm): 153   Movement: Present     General:  Alert, oriented and cooperative. Patient is in no acute distress.  Skin: Skin is warm and dry. No rash noted.   Cardiovascular: Normal heart rate noted  Respiratory: Normal respiratory effort, no problems with respiration noted  Abdomen: Soft, gravid, appropriate for gestational age. Pain/Pressure: Present     Pelvic:  Cervical exam deferred        Extremities: Normal range of motion.     Mental Status: Normal mood and affect. Normal behavior. Normal judgment and thought content.   Urinalysis:      Assessment and Plan:  Pregnancy: G3P0110 at 347w6d1. Supervision of high risk pregnancy, antepartum Stable GBS next visit  2. Pre-existing type 2 diabetes mellitus during pregnancy in third trimester Did not bring CBG's, but reports not in goal range. Will start Bid Metformin Continue with weekly  antenatal testing Growth 99 % on 12/15/21 Nl Fetal ECHO   3. Macrosomia of fetus affecting management of mother in third trimester, single or unspecified fetus See above  Preterm labor symptoms and general obstetric precautions including but not limited to vaginal bleeding, contractions, leaking of fluid and fetal movement were reviewed in detail with the patient. Please refer to After Visit Summary for other counseling recommendations.  Return in about 2 weeks (around 01/15/2022) for OB visit, face to face, MD only.   ErChancy MilroyMD

## 2022-01-04 ENCOUNTER — Encounter: Payer: Self-pay | Admitting: Obstetrics and Gynecology

## 2022-01-05 ENCOUNTER — Ambulatory Visit: Payer: Medicaid Other | Attending: Obstetrics

## 2022-01-05 ENCOUNTER — Ambulatory Visit: Payer: Medicaid Other | Admitting: *Deleted

## 2022-01-05 ENCOUNTER — Encounter (HOSPITAL_COMMUNITY): Payer: Self-pay | Admitting: Obstetrics and Gynecology

## 2022-01-05 ENCOUNTER — Other Ambulatory Visit: Payer: Self-pay

## 2022-01-05 ENCOUNTER — Inpatient Hospital Stay (HOSPITAL_BASED_OUTPATIENT_CLINIC_OR_DEPARTMENT_OTHER): Payer: Medicaid Other | Admitting: Maternal & Fetal Medicine

## 2022-01-05 ENCOUNTER — Inpatient Hospital Stay (HOSPITAL_COMMUNITY)
Admission: AD | Admit: 2022-01-05 | Discharge: 2022-01-05 | Discharge status: Against Medical Advice | Payer: Medicaid Other | Attending: Obstetrics and Gynecology | Admitting: Obstetrics and Gynecology

## 2022-01-05 ENCOUNTER — Inpatient Hospital Stay (EMERGENCY_DEPARTMENT_HOSPITAL)
Admission: AD | Admit: 2022-01-05 | Discharge: 2022-01-05 | Disposition: A | Discharge status: Home / Self Care | Payer: Medicaid Other | Source: Home / Self Care | Attending: Obstetrics and Gynecology | Admitting: Obstetrics and Gynecology

## 2022-01-05 VITALS — BP 144/93 | HR 87

## 2022-01-05 DIAGNOSIS — R030 Elevated blood-pressure reading, without diagnosis of hypertension: Secondary | ICD-10-CM | POA: Insufficient documentation

## 2022-01-05 DIAGNOSIS — O139 Gestational [pregnancy-induced] hypertension without significant proteinuria, unspecified trimester: Secondary | ICD-10-CM | POA: Diagnosis not present

## 2022-01-05 DIAGNOSIS — O163 Unspecified maternal hypertension, third trimester: Secondary | ICD-10-CM | POA: Diagnosis not present

## 2022-01-05 DIAGNOSIS — E669 Obesity, unspecified: Secondary | ICD-10-CM

## 2022-01-05 DIAGNOSIS — O99213 Obesity complicating pregnancy, third trimester: Secondary | ICD-10-CM

## 2022-01-05 DIAGNOSIS — O3660X1 Maternal care for excessive fetal growth, unspecified trimester, fetus 1: Secondary | ICD-10-CM | POA: Diagnosis not present

## 2022-01-05 DIAGNOSIS — Z3A34 34 weeks gestation of pregnancy: Secondary | ICD-10-CM

## 2022-01-05 DIAGNOSIS — O26893 Other specified pregnancy related conditions, third trimester: Secondary | ICD-10-CM | POA: Diagnosis present

## 2022-01-05 DIAGNOSIS — O3663X0 Maternal care for excessive fetal growth, third trimester, not applicable or unspecified: Secondary | ICD-10-CM

## 2022-01-05 DIAGNOSIS — O24410 Gestational diabetes mellitus in pregnancy, diet controlled: Secondary | ICD-10-CM

## 2022-01-05 DIAGNOSIS — O1414 Severe pre-eclampsia complicating childbirth: Secondary | ICD-10-CM | POA: Insufficient documentation

## 2022-01-05 DIAGNOSIS — O133 Gestational [pregnancy-induced] hypertension without significant proteinuria, third trimester: Secondary | ICD-10-CM | POA: Insufficient documentation

## 2022-01-05 DIAGNOSIS — O09293 Supervision of pregnancy with other poor reproductive or obstetric history, third trimester: Secondary | ICD-10-CM | POA: Insufficient documentation

## 2022-01-05 DIAGNOSIS — O09213 Supervision of pregnancy with history of pre-term labor, third trimester: Secondary | ICD-10-CM

## 2022-01-05 DIAGNOSIS — O09893 Supervision of other high risk pregnancies, third trimester: Secondary | ICD-10-CM | POA: Insufficient documentation

## 2022-01-05 DIAGNOSIS — O1493 Unspecified pre-eclampsia, third trimester: Secondary | ICD-10-CM | POA: Insufficient documentation

## 2022-01-05 DIAGNOSIS — O0990 Supervision of high risk pregnancy, unspecified, unspecified trimester: Secondary | ICD-10-CM

## 2022-01-05 DIAGNOSIS — Z8759 Personal history of other complications of pregnancy, childbirth and the puerperium: Secondary | ICD-10-CM | POA: Insufficient documentation

## 2022-01-05 HISTORY — DX: Gestational diabetes mellitus in pregnancy, unspecified control: O24.419

## 2022-01-05 LAB — CBC WITH DIFFERENTIAL/PLATELET
Abs Immature Granulocytes: 0.04 10*3/uL (ref 0.00–0.07)
Basophils Absolute: 0 10*3/uL (ref 0.0–0.1)
Basophils Relative: 0 %
Eosinophils Absolute: 0.1 10*3/uL (ref 0.0–0.5)
Eosinophils Relative: 1 %
HCT: 33.9 % — ABNORMAL LOW (ref 36.0–46.0)
Hemoglobin: 10.8 g/dL — ABNORMAL LOW (ref 12.0–15.0)
Immature Granulocytes: 1 %
Lymphocytes Relative: 17 %
Lymphs Abs: 1.3 10*3/uL (ref 0.7–4.0)
MCH: 26.1 pg (ref 26.0–34.0)
MCHC: 31.9 g/dL (ref 30.0–36.0)
MCV: 81.9 fL (ref 80.0–100.0)
Monocytes Absolute: 1 10*3/uL (ref 0.1–1.0)
Monocytes Relative: 13 %
Neutro Abs: 5.2 10*3/uL (ref 1.7–7.7)
Neutrophils Relative %: 68 %
Platelets: 213 10*3/uL (ref 150–400)
RBC: 4.14 MIL/uL (ref 3.87–5.11)
RDW: 12.6 % (ref 11.5–15.5)
WBC: 7.5 10*3/uL (ref 4.0–10.5)
nRBC: 0 % (ref 0.0–0.2)

## 2022-01-05 LAB — COMPREHENSIVE METABOLIC PANEL
ALT: 22 U/L (ref 0–44)
AST: 15 U/L (ref 15–41)
Albumin: 2.6 g/dL — ABNORMAL LOW (ref 3.5–5.0)
Alkaline Phosphatase: 100 U/L (ref 38–126)
Anion gap: 6 (ref 5–15)
BUN: 9 mg/dL (ref 6–20)
CO2: 21 mmol/L — ABNORMAL LOW (ref 22–32)
Calcium: 9.1 mg/dL (ref 8.9–10.3)
Chloride: 109 mmol/L (ref 98–111)
Creatinine, Ser: 0.86 mg/dL (ref 0.44–1.00)
GFR, Estimated: >60 mL/min (ref >60)
Glucose, Bld: 135 mg/dL — ABNORMAL HIGH (ref 70–99)
Potassium: 4 mmol/L (ref 3.5–5.1)
Sodium: 136 mmol/L (ref 135–145)
Total Bilirubin: 0.3 mg/dL (ref 0.3–1.2)
Total Protein: 5.6 g/dL — ABNORMAL LOW (ref 6.5–8.1)

## 2022-01-05 LAB — PROTEIN / CREATININE RATIO, URINE
Creatinine, Urine: 172 mg/dL
Protein Creatinine Ratio: 0.28 mg/mg{Cre} — ABNORMAL HIGH (ref 0.00–0.15)
Total Protein, Urine: 49 mg/dL

## 2022-01-05 NOTE — Progress Notes (Signed)
Pt states she's only here to have blood work drawn and needs to leave to attend a 1000 appt in [place].    Altamease Oiler, NP spoke to pt regarding POC for this visit.  Pt reports she will return today after appt in [place].    Left AMA stating she will return later today.

## 2022-01-05 NOTE — MAU Provider Note (Signed)
History     CSN: [id number]  Arrival date and time: 01/05/22 1247   Event Date/Time   First Provider Initiated Contact with Patient 01/05/22 1428      Chief Complaint  Patient presents with   BP Eval   HPI  Ms.Alicia Terry is a 31 y.o. female G78P0110 @ 44w3dhere in MAU with elevated BP. She was seen by MFM today and had elevated BP's. She was sent here for additional workup. Initially she came and had to leave d[place]t [place] She had her labs drawn and left and then returned.   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   pos HSV 1 IgG    Past Surgical History:  Procedure Laterality Date   CYST EXCISION     Chest   WISDOM TOOTH EXTRACTION      Family History  Problem Relation Age of Onset   Diabetes Mother    Hypertension Mother    Healthy Father    Diabetes Sister    Heart disease Maternal Grandmother    Hypertension Maternal Grandmother    Depression Maternal Grandmother    Heart disease Maternal Grandfather    Hypertension Maternal Grandfather    Depression Maternal Grandfather    Dementia Paternal Grandmother     Social History   Tobacco Use   Smoking status: Never   Smokeless tobacco: Never  Vaping Use   Vaping Use: Never used  Substance Use Topics   Alcohol use: Not Currently   Drug use: Not Currently    Types: Marijuana    Allergies: No Known Allergies  Medications Prior to Admission  Medication Sig Dispense Refill Last Dose   Cholecalciferol (VITAMIN D3 GUMMIES ADULT PO) Take by mouth.   01/04/2022   metFORMIN (GLUCOPHAGE) 500 MG tablet Take 1 tablet (500 mg total) by mouth 2  (two) times daily with a meal. 60 tablet 5 01/05/2022   Prenatal Vit-Fe Fumarate-FA (MULTIVITAMIN-PRENATAL) 27-0.8 MG TABS tablet Take 1 tablet by mouth daily at 12 noon.   01/04/2022   Accu-Chek Softclix Lancets lancets Use as instructed; check blood glucose 4 times daily 100 each 12    aspirin EC 81 MG tablet Take 1 tablet (81 mg total) by mouth daily. Take after 12 weeks for prevention of preeclampsia later in pregnancy 300 tablet 2 01/02/2022   Blood Glucose Monitoring Suppl (ACCU-CHEK GUIDE ME) w/Device KIT See admin instructions.      ferrous sulfate 325 (65 FE) MG tablet Take 1 tablet (325 mg total) by mouth daily. (Patient not taking: Reported on 12/15/2021)  3    glucose blood (ACCU-CHEK GUIDE) test strip Use as instructed; check blood glucose 4 times daily 100 each 11    Results for orders placed or performed during the hospital encounter of 01/05/22 (from the past 48 hour(s))  CBC with Differential/Platelet  Status: Abnormal   Collection Time: 01/05/22  9:05 AM  Result Value Ref Range   WBC 7.5 4.0 - 10.5 K/uL   RBC 4.14 3.87 - 5.11 MIL/uL   Hemoglobin 10.8 (L) 12.0 - 15.0 g/dL   HCT 33.9 (L) 36.0 - 46.0 %   MCV 81.9 80.0 - 100.0 fL   MCH 26.1 26.0 - 34.0 pg   MCHC 31.9 30.0 - 36.0 g/dL   RDW 12.6 11.5 - 15.5 %   Platelets 213 150 - 400 K/uL   nRBC 0.0 0.0 - 0.2 %   Neutrophils Relative % 68 %   Neutro Abs 5.2 1.7 - 7.7 K/uL   Lymphocytes Relative 17 %   Lymphs Abs 1.3 0.7 - 4.0 K/uL   Monocytes Relative 13 %   Monocytes Absolute 1.0 0.1 - 1.0 K/uL   Eosinophils Relative 1 %   Eosinophils Absolute 0.1 0.0 - 0.5 K/uL   Basophils Relative 0 %   Basophils Absolute 0.0 0.0 - 0.1 K/uL   Immature Granulocytes 1 %   Abs Immature Granulocytes 0.04 0.00 - 0.07 K/uL    Comment: Performed at [facility], [address] [place], [place] [address]  Comprehensive metabolic panel     Status: Abnormal   Collection Time: 01/05/22  9:05 AM  Result Value Ref Range   Sodium 136  135 - 145 mmol/L   Potassium 4.0 3.5 - 5.1 mmol/L   Chloride 109 98 - 111 mmol/L   CO2 21 (L) 22 - 32 mmol/L   Glucose, Bld 135 (H) 70 - 99 mg/dL    Comment: Glucose reference range applies only to samples taken after fasting for at least 8 hours.   BUN 9 6 - 20 mg/dL   Creatinine, Ser 0.86 0.44 - 1.00 mg/dL   Calcium 9.1 8.9 - 10.3 mg/dL   Total Protein 5.6 (L) 6.5 - 8.1 g/dL   Albumin 2.6 (L) 3.5 - 5.0 g/dL   AST 15 15 - 41 U/L   ALT 22 0 - 44 U/L   Alkaline Phosphatase 100 38 - 126 U/L   Total Bilirubin 0.3 0.3 - 1.2 mg/dL   GFR, Estimated >60 >60 mL/min    Comment: (NOTE) Calculated using the CKD-EPI Creatinine Equation (2021)    Anion gap 6 5 - 15    Comment: Performed at [facility], [place] [address], [place], [place] [address]  Protein / creatinine ratio, urine     Status: Abnormal   Collection Time: 01/05/22  1:17 PM  Result Value Ref Range   Creatinine, Urine 172 mg/dL   Total Protein, Urine 49 mg/dL    Comment: NO NORMAL RANGE ESTABLISHED FOR THIS TEST   Protein Creatinine Ratio 0.28 (H) 0.00 - 0.15 mg/mg[Cre]    Comment: Performed at [facility] [address] [place], [place] [address]    Review of Systems  Eyes:  Negative for photophobia and visual disturbance.  Neurological:  Negative for headaches.   Physical Exam   Blood pressure (!) 132/92, pulse 92, temperature 97.8 F (36.6 C), temperature source Oral, resp. rate 19, last menstrual period 05/09/2021, SpO2 99 %. Patient Vitals for the past 24 hrs:  BP Temp Temp src Pulse Resp SpO2  01/05/22 1430 (!) 141/88 -- -- 95 -- 97 %  01/05/22 1415 (!) 132/92 -- -- 92 -- 99 %  01/05/22 1400 (!) 151/96 -- -- 90 -- 100 %  01/05/22 1345 (!) 148/89 -- -- 81 --  93 %  01/05/22 1330 (!) 144/100 -- -- 99 -- 98 %  01/05/22 1326 (!) 140/100 97.8 F (36.6 C) Oral (!) 109 19 97 %     Physical Exam Constitutional:      General: She is not in acute distress.    Appearance: Normal appearance. She is not  ill-appearing, toxic-appearing or diaphoretic.  HENT:     Head: Normocephalic.  Musculoskeletal:        General: Normal range of motion.  Skin:    General: Skin is warm.  Neurological:     Mental Status: She is alert and oriented to person, place, and time.     Deep Tendon Reflexes: Reflexes normal.     Comments: Negative clonus   Psychiatric:        Behavior: Behavior normal.   Fetal Tracing: Baseline: 135 bpm Variability: Moderate  Accelerations: 15x15 Decelerations: None Toco: None  MAU Course  Procedures None  MDM  PIH labs collected and reviewed with patient. No severe range BP's today.  She is asymptomatic.   Assessment and Plan   A:  1. Gestational hypertension, antepartum   2. [redacted] weeks gestation of pregnancy      P:   Dc home Follow up with the OB office [facility] on Monday for BP check.  Message sent to [facility] Preeclampsia precautions.  Noni Saupe I, NP 01/05/2022 7:07 PM

## 2022-01-05 NOTE — Progress Notes (Signed)
MFM Consult Note Patient Name: Alicia Terry  Patient MRN:   [id number]  Referring provider: [facility]  Reason for Consult: Elevated BP   HPI: [name] is a 31 y.o. G3P0110 at 27w3dhere for ultrasound and consultation.   The patient was seen in the office today for a biophysical profile for gestational diabetes type A2, obesity and prior demise at 21 weeks 4 days.  Her blood pressure was elevated at 153/79 with a repeat of 144/93.  She denies headaches, vision changes, chest pain, shortness of breath or right upper quadrant pain.  She reports good fetal movement and denies contractions, vaginal bleeding or loss of fluid.  I discussed the importance of going to the hospital for rule out of preeclampsia.  She verbalized understanding and will go straight to the MAU.  Also discussed that if she develops gestational hypertension or preeclampsia without severe features that delivery at 37 weeks is indicated.  Review of Systems: A review of systems was performed and was negative except per HPI   Vitals and Physical Exam See intake sheet for vitals Sitting comfortably on the sonogram table Nonlabored breathing Normal rate and rhythm Abdomen is nontender  Sonographic findings Single intrauterine pregnancy. Observed fetal cardiac activity. Cephalic presentation. Interval fetal anatomy appears normal.  Amniotic fluid volume: Within normal limits. AFI: 13.7 cm.  MVP: 4.3 cm. Placenta is anterior. BPP is 8/8.   Recommendations 1. Sent to MAU for preeeclampsia rule out. I have called the MAU provider and notified them.  2. BBPs weekly until delivery if discharged 3. Delivery at 37 weeks or sooner if indicated if she rules in for gHTN  I spent 25 minutes reviewing the patients chart, including labs and images as well as counseling the patient about her medical conditions.  Alicia Terry MFM, [place]  01/05/2022  8:46 AM

## 2022-01-05 NOTE — MAU Note (Signed)
Alicia Terry is a 31 y.o. at 60w3dhere in MAU reporting: she was sent from MFM for BP evaluation.  Endorses mild H/A, states hasn't eaten today.  Denies visual disturbances & epigastric pain.  Endorses +FM, denies VB or LOF LMP: N/A Onset of complaint: today Pain score: 3 There were no vitals filed for this visit.   FHT:161 bpm Lab orders placed from triage:   UA

## 2022-01-08 ENCOUNTER — Telehealth: Payer: Self-pay | Admitting: Family Medicine

## 2022-01-08 ENCOUNTER — Telehealth (INDEPENDENT_AMBULATORY_CARE_PROVIDER_SITE_OTHER): Payer: Medicaid Other

## 2022-01-08 VITALS — BP 143/99 | HR 100

## 2022-01-08 DIAGNOSIS — O139 Gestational [pregnancy-induced] hypertension without significant proteinuria, unspecified trimester: Secondary | ICD-10-CM

## 2022-01-08 DIAGNOSIS — Z0130 Encounter for examination of blood pressure without abnormal findings: Secondary | ICD-10-CM

## 2022-01-08 NOTE — Progress Notes (Signed)
Blood Pressure Check Visit  I connected with  Alicia Terry on 01/08/22 at  2:30 PM EST by telephone and verified that I am speaking with the correct person using two identifiers.   I discussed the limitations, risks, security and privacy concerns of performing an evaluation and management service by telephone and the availability of in person appointments. I also discussed with the patient that there may be a patient responsible charge related to this service. The patient expressed understanding and agreed to proceed.  Alicia Terry is here for blood pressure check following admission to MAU on 12/01 for gestational hypertension. BP today is 122/98, 143/99. Patient denies any dizzness blurred vision headache chest pain shortness of breath peripheral edema. Reviewed with K. Forsyth MD. Pt advised to be evaluated by MAU if she checks blood pressure and has reading of 160/100 or greater. Pt given s/s of HTN and agreeable with plan. Pt denied further questions at this time.   Pt has follow up appointment 12/11 with Dr. Rip Harbour. Pt aware.   Darlyne Russian, RN 01/08/2022  2:35 PM

## 2022-01-08 NOTE — Telephone Encounter (Signed)
Called patient to schedule BP Check but there was no answer, left voicemail

## 2022-01-09 ENCOUNTER — Encounter (HOSPITAL_COMMUNITY): Payer: Self-pay | Admitting: Obstetrics & Gynecology

## 2022-01-09 ENCOUNTER — Telehealth: Payer: Self-pay

## 2022-01-09 ENCOUNTER — Other Ambulatory Visit: Payer: Self-pay

## 2022-01-09 ENCOUNTER — Inpatient Hospital Stay (HOSPITAL_COMMUNITY)
Admission: AD | Admit: 2022-01-09 | Discharge: 2022-01-09 | Disposition: A | Discharge status: Home / Self Care | Payer: Medicaid Other | Attending: Obstetrics & Gynecology | Admitting: Obstetrics & Gynecology

## 2022-01-09 DIAGNOSIS — O26893 Other specified pregnancy related conditions, third trimester: Secondary | ICD-10-CM

## 2022-01-09 DIAGNOSIS — Z3A35 35 weeks gestation of pregnancy: Secondary | ICD-10-CM

## 2022-01-09 DIAGNOSIS — O1493 Unspecified pre-eclampsia, third trimester: Secondary | ICD-10-CM

## 2022-01-09 DIAGNOSIS — O99323 Drug use complicating pregnancy, third trimester: Secondary | ICD-10-CM | POA: Insufficient documentation

## 2022-01-09 DIAGNOSIS — F1290 Cannabis use, unspecified, uncomplicated: Secondary | ICD-10-CM | POA: Insufficient documentation

## 2022-01-09 DIAGNOSIS — Z794 Long term (current) use of insulin: Secondary | ICD-10-CM | POA: Diagnosis not present

## 2022-01-09 DIAGNOSIS — O24415 Gestational diabetes mellitus in pregnancy, controlled by oral hypoglycemic drugs: Secondary | ICD-10-CM | POA: Insufficient documentation

## 2022-01-09 DIAGNOSIS — O10913 Unspecified pre-existing hypertension complicating pregnancy, third trimester: Secondary | ICD-10-CM | POA: Diagnosis present

## 2022-01-09 DIAGNOSIS — Z79899 Other long term (current) drug therapy: Secondary | ICD-10-CM | POA: Insufficient documentation

## 2022-01-09 DIAGNOSIS — H538 Other visual disturbances: Secondary | ICD-10-CM | POA: Insufficient documentation

## 2022-01-09 DIAGNOSIS — Z7982 Long term (current) use of aspirin: Secondary | ICD-10-CM | POA: Diagnosis not present

## 2022-01-09 DIAGNOSIS — O1403 Mild to moderate pre-eclampsia, third trimester: Secondary | ICD-10-CM | POA: Insufficient documentation

## 2022-01-09 LAB — COMPREHENSIVE METABOLIC PANEL
ALT: 22 U/L (ref 0–44)
AST: 21 U/L (ref 15–41)
Albumin: 2.7 g/dL — ABNORMAL LOW (ref 3.5–5.0)
Alkaline Phosphatase: 115 U/L (ref 38–126)
Anion gap: 8 (ref 5–15)
BUN: 13 mg/dL (ref 6–20)
CO2: 20 mmol/L — ABNORMAL LOW (ref 22–32)
Calcium: 8.8 mg/dL — ABNORMAL LOW (ref 8.9–10.3)
Chloride: 109 mmol/L (ref 98–111)
Creatinine, Ser: 0.88 mg/dL (ref 0.44–1.00)
GFR, Estimated: >60 mL/min (ref >60)
Glucose, Bld: 101 mg/dL — ABNORMAL HIGH (ref 70–99)
Potassium: 4 mmol/L (ref 3.5–5.1)
Sodium: 137 mmol/L (ref 135–145)
Total Bilirubin: 0.3 mg/dL (ref 0.3–1.2)
Total Protein: 6 g/dL — ABNORMAL LOW (ref 6.5–8.1)

## 2022-01-09 LAB — PROTEIN / CREATININE RATIO, URINE
Creatinine, Urine: 378 mg/dL
Protein Creatinine Ratio: 0.4 mg/mg{Cre} — ABNORMAL HIGH (ref 0.00–0.15)
Total Protein, Urine: 150 mg/dL

## 2022-01-09 LAB — URINALYSIS, ROUTINE W REFLEX MICROSCOPIC
Bilirubin Urine: NEGATIVE
Glucose, UA: NEGATIVE mg/dL
Hgb urine dipstick: NEGATIVE
Ketones, ur: NEGATIVE mg/dL
Leukocytes,Ua: NEGATIVE
Nitrite: NEGATIVE
Protein, ur: >=300 mg/dL — AB
Specific Gravity, Urine: 1.028 (ref 1.005–1.030)
pH: 5 (ref 5.0–8.0)

## 2022-01-09 LAB — CBC
HCT: 32.6 % — ABNORMAL LOW (ref 36.0–46.0)
Hemoglobin: 10.9 g/dL — ABNORMAL LOW (ref 12.0–15.0)
MCH: 26.7 pg (ref 26.0–34.0)
MCHC: 33.4 g/dL (ref 30.0–36.0)
MCV: 79.9 fL — ABNORMAL LOW (ref 80.0–100.0)
Platelets: 209 10*3/uL (ref 150–400)
RBC: 4.08 MIL/uL (ref 3.87–5.11)
RDW: 12.7 % (ref 11.5–15.5)
WBC: 5.6 10*3/uL (ref 4.0–10.5)
nRBC: 0 % (ref 0.0–0.2)

## 2022-01-09 MED ORDER — CAFFEINE 200 MG PO TABS
200.0000 mg | ORAL_TABLET | Freq: Once | ORAL | Status: AC
  Administered 2022-01-09: 200 mg via ORAL
  Filled 2022-01-09: qty 1

## 2022-01-09 MED ORDER — CAFFEINE 200 MG PO TABS: 200.0000 mg | ORAL_TABLET | ORAL | 0 refills | Status: DC | PRN

## 2022-01-09 MED ORDER — METOCLOPRAMIDE HCL 10 MG PO TABS
10.0000 mg | ORAL_TABLET | Freq: Once | ORAL | Status: AC
  Administered 2022-01-09: 10 mg via ORAL
  Filled 2022-01-09: qty 1

## 2022-01-09 MED ORDER — METOCLOPRAMIDE HCL 10 MG PO TABS: 10.0000 mg | ORAL_TABLET | Freq: Four times a day (QID) | ORAL | 0 refills | Status: DC

## 2022-01-09 NOTE — MAU Note (Signed)
.  Alicia Terry is a 31 y.o. at 35w0dhere in MAU reporting: Pt reports she took her blood pressure at home and it was 164/102. Pt reports she is suppose to be watching her blood pressures.  Onset of complaint: pt reports her last appointment. Pain score: 5/10 headache and reports blurred vision today  There were no vitals filed for this visit.    Lab orders placed from triage:   none

## 2022-01-09 NOTE — Telephone Encounter (Signed)
Patient called to report BP. Per chart review, patient was seen at MAU 12/1 for elevated BP and diagnosed with gest htn. BP today is 164/103 on her home cuff. Denies s/s of htn. Spoke with Tamala Julian CNM who recommended MAU for eval.

## 2022-01-09 NOTE — MAU Provider Note (Cosign Needed Addendum)
History     CSN: [id number]  Arrival date and time: 01/09/22 1621   None     Chief Complaint  Patient presents with   Hypertension   31yo G3P0110 at 17w0dpresenting with elevated blood pressure readings in the setting of gestational hypertension and GDMA2 (metformin 5073mBID). Patient had elevated blood pressure reading at home: 164/103 on home cuff. Currently with a 5/10 headache, which is unusual for her. Patient reports she does not typically get headaches. Also noting some blurry vision - wears glasses normally and is not currently. No abdominal pain. Noting BLE edema. No VB, LOF. +FM.   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   pos HSV 1 IgG    Past Surgical History:  Procedure Laterality Date   CYST EXCISION     Chest   WISDOM TOOTH EXTRACTION      Family History  Problem Relation Age of Onset   Diabetes Mother    Hypertension Mother    Healthy Father    Diabetes Sister    Heart disease Maternal Grandmother    Hypertension Maternal Grandmother    Depression Maternal Grandmother    Heart disease Maternal Grandfather    Hypertension Maternal Grandfather    Depression Maternal Grandfather    Dementia Paternal Grandmother     Social History   Tobacco Use   Smoking status: Never   Smokeless tobacco: Never  Vaping Use   Vaping Use: Never used  Substance Use Topics   Alcohol use: Not Currently   Drug use: Not Currently    Types: Marijuana    Allergies: No Known Allergies  Medications Prior to Admission  Medication Sig Dispense Refill Last Dose   Accu-Chek Softclix Lancets lancets Use as instructed; check blood  glucose 4 times daily 100 each 12 01/09/2022   glucose blood (ACCU-CHEK GUIDE) test strip Use as instructed; check blood glucose 4 times daily 100 each 11 01/09/2022   metFORMIN (GLUCOPHAGE) 500 MG tablet Take 1 tablet (500 mg total) by mouth 2 (two) times daily with a meal. 60 tablet 5 01/09/2022   Prenatal Vit-Fe Fumarate-FA (MULTIVITAMIN-PRENATAL) 27-0.8 MG TABS tablet Take 1 tablet by mouth daily at 12 noon.   01/09/2022   aspirin EC 81 MG tablet Take 1 tablet (81 mg total) by mouth daily. Take after 12 weeks for prevention of preeclampsia later in pregnancy 300 tablet 2    Blood Glucose Monitoring Suppl (ACCU-CHEK GUIDE ME) w/Device KIT See admin instructions.      Cholecalciferol (VITAMIN D3 GUMMIES ADULT PO) Take by mouth.      ferrous sulfate 325 (65 FE) MG tablet Take 1 tablet (325 mg total) by mouth daily. (Patient not taking: Reported on 12/15/2021)  3     Review of Systems  Constitutional:  Negative for fever.  HENT:  Negative for congestion.   Eyes:  Positive for  visual disturbance.  Respiratory:  Negative for cough, shortness of breath and wheezing.   Cardiovascular:  Positive for leg swelling. Negative for chest pain.  Gastrointestinal:  Negative for abdominal pain, nausea and vomiting.  Genitourinary:  Negative for dysuria, vaginal bleeding and vaginal discharge.  Neurological:  Positive for headaches.   Physical Exam   Blood pressure (!) 140/98, pulse 84, temperature 97.6 F (36.4 C), resp. rate 16, last menstrual period 05/09/2021, SpO2 100 %.  Physical Exam Vitals and nursing note reviewed.  Constitutional:      General: She is not in acute distress.    Appearance: Normal appearance. She is well-developed. She is not toxic-appearing.  HENT:     Head: Normocephalic and atraumatic.  Eyes:     Pupils: Pupils are equal, round, and reactive to light.  Cardiovascular:     Rate and Rhythm: Normal rate and regular rhythm.     Heart sounds: Normal heart sounds.  Pulmonary:      Effort: Pulmonary effort is normal. No respiratory distress.     Breath sounds: Normal breath sounds. No wheezing.  Abdominal:     General: Bowel sounds are normal. There is no distension.     Palpations: Abdomen is soft.     Tenderness: There is no abdominal tenderness.  Musculoskeletal:     Right lower leg: Edema present.     Left lower leg: Edema present.  Skin:    General: Skin is warm and dry.  Neurological:     Mental Status: She is alert and oriented to person, place, and time.     Motor: No abnormal muscle tone.     Coordination: Coordination normal.     Deep Tendon Reflexes: Reflexes are normal and symmetric. Reflexes normal.  Psychiatric:        Behavior: Behavior normal.        Thought Content: Thought content normal.        Judgment: Judgment normal.     Fetal Tracing: Baseline: 130 bpm Variability: Moderate  Accelerations: 15x15 Decelerations: None Toco: mild, irregular  Results for orders placed or performed during the hospital encounter of 01/09/22 (from the past 24 hour(s))  Protein / creatinine ratio, urine     Status: Abnormal   Collection Time: 01/09/22  4:42 PM  Result Value Ref Range   Creatinine, Urine 378 mg/dL   Total Protein, Urine 150 mg/dL   Protein Creatinine Ratio 0.40 (H) 0.00 - 0.15 mg/mg[Cre]  CBC     Status: Abnormal   Collection Time: 01/09/22  4:53 PM  Result Value Ref Range   WBC 5.6 4.0 - 10.5 K/uL   RBC 4.08 3.87 - 5.11 MIL/uL   Hemoglobin 10.9 (L) 12.0 - 15.0 g/dL   HCT 32.6 (L) 36.0 - 46.0 %   MCV 79.9 (L) 80.0 - 100.0 fL   MCH 26.7 26.0 - 34.0 pg   MCHC 33.4 30.0 - 36.0 g/dL   RDW 12.7 11.5 - 15.5 %   Platelets 209 150 - 400 K/uL   nRBC 0.0 0.0 - 0.2 %  Comprehensive metabolic panel     Status: Abnormal   Collection Time: 01/09/22  4:53 PM  Result Value Ref Range   Sodium 137 135 - 145 mmol/L   Potassium 4.0 3.5 - 5.1 mmol/L   Chloride 109 98 - 111 mmol/L   CO2 20 (L) 22 - 32 mmol/L   Glucose, Bld 101 (H) 70 - 99  mg/dL   BUN 13 6 - 20 mg/dL  Creatinine, Ser 0.88 0.44 - 1.00 mg/dL   Calcium 8.8 (L) 8.9 - 10.3 mg/dL   Total Protein 6.0 (L) 6.5 - 8.1 g/dL   Albumin 2.7 (L) 3.5 - 5.0 g/dL   AST 21 15 - 41 U/L   ALT 22 0 - 44 U/L   Alkaline Phosphatase 115 38 - 126 U/L   Total Bilirubin 0.3 0.3 - 1.2 mg/dL   GFR, Estimated >60 >60 mL/min   Anion gap 8 5 - 15    MAU Course  Procedures  MDM Patient presenting with elevated home blood pressure reading of 164/103. No severe range blood pressure readings here. Noting headache that is 5/10 in intensity - resolved with caffeine/reglan administration. -CMP, CBC, PCR ordered  Assessment and Plan   1. Preeclampsia, third trimester   2. Headache in pregnancy, antepartum, third trimester   3. [redacted] weeks gestation of pregnancy    - CMP, CBC reassuring. PCR elevated to 0.4 - diagnostic for pre-eclampsia without severe features. Headache resolved with caffeine and reglan. Patient requested not to have tylenol - concerned for possible risk of autism even after counseling. No evidence for severe features at this time. - Patient to have follow-up BPP on 12/8.  - Patient to follow up with Dr. Rip Harbour on 12/11.  - Patient scheduled for IOL for pre-eclampsia at 55w1don 01/24/22 - Return precautions given   Jiyun N. Chang 01/09/2022, 7:20 PM   Attestation of Supervision of Student:  I confirm that I have verified the information documented in the  resident  student's note and that I have also personally reperformed the history, physical exam and all medical decision making activities.  I have verified that all services and findings are accurately documented in this student's note; and I agree with management and plan as outlined in the documentation. I have also made any necessary editorial changes.  Lengthy discussion of preeclampsia precautions and when to return to MAU. Severe features reviewed at length. Patient believes her home cuff is inaccurate and reports it  is much lower on her mother's cuff. Encouraged patient to bring cuff to office visit to compare and use mother's cuff in the meantime.   Patient has appointment on Friday for BPP and Monday 12/11 for HROB. IOL scheduled for 12/20 in the morning   CWende Mott CMarbleheadfor [employer] [facility] 01/09/2022 7:39 PM

## 2022-01-10 NOTE — Progress Notes (Signed)
[facility]  Follow-up Visit  Date:  01/12/2022   ID:  Alicia Terry, DOB [birth year], MRN [id number]  PCP:  Venita Lick, NP   [facility] Providers Cardiologist:  None  Electrophysiologist:  None     Referring MD: Venita Lick, NP   Chief Complaint: morbid obesity, pre-diabetes  History of Present Illness:    Alicia Terry is a 31 y.o. female [G3P0110] who presents to clinic for follow-up.  Patient seen in clinic by Dr. Harolyn Rutherford on 07/27/21 where she was seen for high risk pregnancy. She was started on ASA 74m daily and referred to Cardio-OB for further risk stratification given morbid obesity.  Was seen in clinic on 09/01/21 where she was doing well. Was going to try to start exercising. Had been diagnosed with pre-diabetes in pregnancy and was working with a dietician to help.   Was seen in MAU on 01/09/22 with elevated blood pressure 164/103. Protein/Cr ratio elevated at 0.4. Diagnosed with pre-eclampsia. She was stabilized and discharged home.   Today, the patient is currently 318w3dShe states that she has been having elevated blood pressures since week 32. No associated headaches, chest pain, SOB, lightheadedness or dizziness. Has mild left ankle swelling. Was seen in MAU on multiple occasions and was recently diagnosed with pre-eclampsia. Currently BP remains elevated in the 140-150s at home which is consistent with readings here. She is not currently on antihypertensives, but is willing to start them today. Following very closely with OBGYN.  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 Take after 12 weeks for prevention of preeclampsia later in pregnancy   Blood Glucose Monitoring Suppl (ACCU-CHEK GUIDE ME) w/Device KIT See admin instructions.   Cholecalciferol (VITAMIN D3 GUMMIES ADULT PO) Take by mouth.   glucose blood (ACCU-CHEK GUIDE) test strip Use as instructed; check blood glucose 4 times daily   metFORMIN (GLUCOPHAGE) 500 MG tablet Take 1 tablet (500 mg total) by mouth 2 (two) times daily with a meal.   metoCLOPramide (REGLAN) 10 MG tablet Take 1 tablet (10 mg total) by mouth 4 (four) times daily for 30 doses.   NIFEdipine (PROCARDIA-XL/NIFEDICAL-XL) 30 MG 24 hr tablet Take 1 tablet (30 mg total) by mouth daily.   Prenatal Vit-Fe Fumarate-FA (MULTIVITAMIN-PRENATAL) 27-0.8 MG TABS tablet Take 1 tablet by mouth daily at 12  noon.     Allergies:   Patient has no known allergies.   Social History   Socioeconomic History   Marital status: Single    Spouse name: Not on file   Number of children: Not on file   Years of education: Not on file   Highest education level: Not on file  Occupational History   Occupation: Beautician  Tobacco Use   Smoking status: Never   Smokeless tobacco: Never  Vaping Use   Vaping Use: Never used  Substance and Sexual Activity   Alcohol use: Not Currently   Drug use: Not Currently    Types: Marijuana   Sexual activity: Yes    Birth control/protection: None  Other Topics Concern   Not on file  Social History Narrative   Not on file   Social Determinants of Health   Financial Resource Strain: Low Risk   (08/05/2020)   Overall Financial Resource Strain (CARDIA)    Difficulty of Paying Living Expenses: Not hard at all  Food Insecurity: No Food Insecurity (10/30/2021)   Hunger Vital Sign    Worried About Running Out of Food in the Last Year: Never true    [place] in the Last Year: Never true  Transportation Needs: No Transportation Needs (10/30/2021)   PRAPARE - Hydrologist (Medical): No    Lack of Transportation (Non-Medical): No  Physical Activity: Sufficiently Active (08/05/2020)   Exercise Vital Sign    Days of Exercise per Week: 7 days    Minutes of Exercise per Session: 30 min  Stress: No Stress Concern Present (08/05/2020)   [place]    Feeling of Stress : Only a little  Social Connections: Socially Isolated (08/05/2020)   Social Connection and Isolation Panel [NHANES]    Frequency of Communication with Friends and Family: Twice a week    Frequency of Social Gatherings with Friends and Family: Twice a week    Attends Religious Services: Never    Marine scientist or Organizations: No    Attends Music therapist: Never    Marital Status: Never married      Family History  Problem Relation Age of Onset   Diabetes Mother    Hypertension Mother    Healthy Father    Diabetes Sister    Heart disease Maternal Grandmother    Hypertension Maternal Grandmother    Depression Maternal Grandmother    Heart disease Maternal Grandfather    Hypertension Maternal Grandfather    Depression Maternal Grandfather    Dementia Paternal Grandmother       ROS:   Please see the history of present illness.    All other systems reviewed and are negative.   Labs/EKG Reviewed:    EKG:   EKG is ordered today.  The ekg ordered today demonstrates NSR with HR 89  Recent Labs: 07/27/2021: TSH 0.961 01/09/2022: ALT 22; BUN 13; Creatinine, Ser 0.88; Hemoglobin 10.9; Platelets 209;  Potassium 4.0; Sodium 137   Recent Lipid Panel Lab Results  Component Value Date/Time   CHOL 135 08/05/2020 01:55 PM   TRIG 34 08/05/2020 01:55 PM   HDL 50 08/05/2020 01:55 PM   LDLCALC 76 08/05/2020 01:55 PM    Physical Exam:    VS:  BP (!) 146/92   Pulse 92   Ht _0  (1.727 m)   Wt (!) 312 lb (141.5 kg)   LMP 05/09/2021 (Exact Date)  SpO2 97%   BMI 47.44 kg/m     Wt Readings from Last 3 Encounters:  01/12/22 (!) 312 lb (141.5 kg)  01/05/22 (!) 303 lb 12.8 oz (137.8 kg)  01/01/22 297 lb 4.8 oz (134.9 kg)     GEN:  Comfortable, NAD HEENT: Normal NECK: No JVD, no carotid bruits CARDIAC: RRR, soft systolic flow murmur RESPIRATORY:  CTAB, no wheezes ABDOMEN: Gravid, soft MUSCULOSKELETAL:  No edema; No deformity  SKIN: Warm and dry NEUROLOGIC:  Alert and oriented x 3 PSYCHIATRIC:  Normal affect    Risk Assessment/Risk Calculators:   { ASSESSMENT & PLAN:    #Pre-Eclampsia in 3rd Trimester: #High Risk Pregnancy: Recently diagnosed when she went to MAU with severely elevated blood pressure. Pr/Cr ratio elevated at 0.4. Currently, with SBP 140-150s/100s. Will start nifedipine and uptitrate as needed. -Continue ASA 80m daily -Start nifedipine 362mdaily and up-titrate as needed   #Diabetes in Pregnancy: A1C 6.5. Now on metformin.  #Morbid Obesity: Has maintained healthy lifestyle during pregnancy.  -Continue exercise with goal 15056mof moderate intensity exercise per week -Following with dietician  -Will need continued follow-up following pregnancy for CV prevention/screening  Patient Instructions  Medication Instructions:   START TAKING NIFEDIPINE 30 MG BY MOUTH DAILY--IF AFTER 2 DAYS YOUR BP ISN'T DOWN, THEN YOU CAN DOUBLE UP ON THIS DOSE AS DIRECTED BY DR. PEMJohney FrameDAY  *If you need a refill on your cardiac medications before your next appointment, please call your pharmacy*    Follow-Up:  4 MONTHS WITH DR. [facility]EMJohney FrameRE [facility]         Dispo:  No follow-ups on file.   Medication Adjustments/Labs and Tests Ordered: Current medicines are reviewed at length with the patient today.  Concerns regarding medicines are outlined above.  Tests Ordered: No orders of the defined types were placed in this encounter.  Medication Changes: Meds ordered this encounter  Medications   NIFEdipine (PROCARDIA-XL/NIFEDICAL-XL) 30 MG 24 hr tablet    Sig: Take 1 tablet (30 mg total) by mouth daily.    Dispense:  90 tablet    Refill:  3

## 2022-01-12 ENCOUNTER — Ambulatory Visit: Payer: Medicaid Other | Attending: Obstetrics

## 2022-01-12 ENCOUNTER — Ambulatory Visit: Payer: Medicaid Other | Admitting: *Deleted

## 2022-01-12 ENCOUNTER — Encounter: Payer: Self-pay | Admitting: Cardiology

## 2022-01-12 ENCOUNTER — Ambulatory Visit (INDEPENDENT_AMBULATORY_CARE_PROVIDER_SITE_OTHER): Payer: Medicaid Other | Admitting: Cardiology

## 2022-01-12 VITALS — BP 146/92 | HR 92 | Ht 68.0 in | Wt 312.0 lb

## 2022-01-12 VITALS — BP 139/87 | HR 98

## 2022-01-12 DIAGNOSIS — O1403 Mild to moderate pre-eclampsia, third trimester: Secondary | ICD-10-CM | POA: Diagnosis not present

## 2022-01-12 DIAGNOSIS — O99213 Obesity complicating pregnancy, third trimester: Secondary | ICD-10-CM | POA: Diagnosis not present

## 2022-01-12 DIAGNOSIS — E669 Obesity, unspecified: Secondary | ICD-10-CM

## 2022-01-12 DIAGNOSIS — O24410 Gestational diabetes mellitus in pregnancy, diet controlled: Secondary | ICD-10-CM | POA: Diagnosis not present

## 2022-01-12 DIAGNOSIS — O0990 Supervision of high risk pregnancy, unspecified, unspecified trimester: Secondary | ICD-10-CM | POA: Insufficient documentation

## 2022-01-12 DIAGNOSIS — O99210 Obesity complicating pregnancy, unspecified trimester: Secondary | ICD-10-CM

## 2022-01-12 DIAGNOSIS — O09893 Supervision of other high risk pregnancies, third trimester: Secondary | ICD-10-CM | POA: Insufficient documentation

## 2022-01-12 DIAGNOSIS — O09293 Supervision of pregnancy with other poor reproductive or obstetric history, third trimester: Secondary | ICD-10-CM | POA: Diagnosis not present

## 2022-01-12 DIAGNOSIS — O3660X0 Maternal care for excessive fetal growth, unspecified trimester, not applicable or unspecified: Secondary | ICD-10-CM | POA: Diagnosis not present

## 2022-01-12 DIAGNOSIS — O403XX0 Polyhydramnios, third trimester, not applicable or unspecified: Secondary | ICD-10-CM

## 2022-01-12 DIAGNOSIS — O3660X1 Maternal care for excessive fetal growth, unspecified trimester, fetus 1: Secondary | ICD-10-CM | POA: Diagnosis not present

## 2022-01-12 DIAGNOSIS — O133 Gestational [pregnancy-induced] hypertension without significant proteinuria, third trimester: Secondary | ICD-10-CM

## 2022-01-12 DIAGNOSIS — Z3A35 35 weeks gestation of pregnancy: Secondary | ICD-10-CM | POA: Diagnosis not present

## 2022-01-12 DIAGNOSIS — O09213 Supervision of pregnancy with history of pre-term labor, third trimester: Secondary | ICD-10-CM | POA: Diagnosis not present

## 2022-01-12 DIAGNOSIS — R7303 Prediabetes: Secondary | ICD-10-CM

## 2022-01-12 MED ORDER — NIFEDIPINE ER OSMOTIC RELEASE 30 MG PO TB24: 30.0000 mg | ORAL_TABLET | Freq: Every day | ORAL | 3 refills | Status: DC

## 2022-01-12 NOTE — Patient Instructions (Signed)
Medication Instructions:   START TAKING NIFEDIPINE 30 MG BY MOUTH DAILY--IF AFTER 2 DAYS YOUR BP ISN'T DOWN, THEN YOU CAN DOUBLE UP ON THIS DOSE AS DIRECTED BY DR. Johney Frame TODAY  *If you need a refill on your cardiac medications before your next appointment, please call your pharmacy*    Follow-Up:  4 MONTHS WITH DR. Johney Frame HERE AT [place]

## 2022-01-15 ENCOUNTER — Ambulatory Visit (INDEPENDENT_AMBULATORY_CARE_PROVIDER_SITE_OTHER): Payer: Medicaid Other | Admitting: Obstetrics and Gynecology

## 2022-01-15 ENCOUNTER — Encounter: Payer: Self-pay | Admitting: Obstetrics and Gynecology

## 2022-01-15 ENCOUNTER — Other Ambulatory Visit: Payer: Self-pay

## 2022-01-15 VITALS — BP 151/89 | HR 101 | Wt 317.9 lb

## 2022-01-15 DIAGNOSIS — O0990 Supervision of high risk pregnancy, unspecified, unspecified trimester: Secondary | ICD-10-CM

## 2022-01-15 DIAGNOSIS — O24415 Gestational diabetes mellitus in pregnancy, controlled by oral hypoglycemic drugs: Secondary | ICD-10-CM

## 2022-01-15 DIAGNOSIS — O1493 Unspecified pre-eclampsia, third trimester: Secondary | ICD-10-CM

## 2022-01-15 DIAGNOSIS — Z8759 Personal history of other complications of pregnancy, childbirth and the puerperium: Secondary | ICD-10-CM

## 2022-01-15 DIAGNOSIS — Z3A35 35 weeks gestation of pregnancy: Secondary | ICD-10-CM

## 2022-01-15 NOTE — Progress Notes (Signed)
Subjective:  Alicia Terry is a 31 y.o. G3P0110 at 59w6dbeing seen today for ongoing prenatal care.  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  Patient reports  general discomforts of pregnancy, denies HA or visual changes. .  Contractions: Not present. Vag. Bleeding: None.  Movement: Present. Denies leaking of fluid.   The following portions of the patient's history were reviewed and updated as appropriate: allergies, current medications, past family history, past medical history, past social history, past surgical history and problem list. Problem list updated.  Objective:   Vitals:   01/15/22 1629 01/15/22 1631  BP: (!) 150/91 (!) 151/89  Pulse: 99 (!) 101  Weight: (!) 317 lb 14.4 oz (144.2 kg)     Fetal Status: Fetal Heart Rate (bpm): 159   Movement: Present     General:  Alert, oriented and cooperative. Patient is in no acute distress.  Skin: Skin is warm and dry. No rash noted.   Cardiovascular: Normal heart rate noted  Respiratory: Normal respiratory effort, no problems with respiration noted  Abdomen: Soft, gravid, appropriate for gestational age. Pain/Pressure: Present     Pelvic:  Cervical exam performed        Extremities: Normal range of motion.  Edema: Trace  Mental Status: Normal mood and affect. Normal behavior. Normal judgment and thought content.   Urinalysis:      Assessment and Plan:  Pregnancy: G3P0110 at 366w6d1. Supervision of high risk pregnancy, antepartum Stable Labor precautions  2. Preeclampsia, third trimester Labs today Continue with weekly antenatal testing IOL scheduled  3. Gestational  diabetes mellitus (GDM) in third trimester controlled on oral hypoglycemic drug Fasting CBG's low and pt feels drowsy Will stop Am dose of Metformin  4. History of fetal demise, not currently pregnant   Term labor symptoms and general obstetric precautions including but not limited to vaginal bleeding, contractions, leaking of fluid and fetal movement were reviewed in detail with the patient. Please refer to After Visit Summary for other counseling recommendations.  No follow-ups on file.   ErChancy MilroyMD

## 2022-01-15 NOTE — Progress Notes (Signed)
Pt states that she feels that the Metformin is too strong seems to drop her levels too low.

## 2022-01-15 NOTE — Patient Instructions (Signed)
Vaginal Delivery  Vaginal delivery means that you give birth by pushing your baby out of your birth canal (vagina). Your health care team will help you before, during, and after vaginal delivery. Birth experiences are unique for every woman and every pregnancy, and birth experiences vary depending on where you choose to give birth. What are the risks and benefits? Generally, this is safe. However, problems may occur, including: Bleeding. Infection. Damage to other structures such as vaginal tearing. Allergic reactions to medicines. Despite the risks, benefits of vaginal delivery include less risk of bleeding and infection and a shorter recovery time compared to a Cesarean delivery. Cesarean delivery, or C-section, is the surgical delivery of a baby. What happens when I arrive at the birth center or hospital? Once you are in labor and have been admitted into the hospital or birth center, your health care team may: Review your pregnancy history and any concerns that you have. Talk with you about your birth plan and discuss pain control options. Check your blood pressure, breathing, and heartbeat. Assess your baby's heartbeat. Monitor your uterus for contractions. Check whether your bag of water (amniotic sac) has broken (ruptured). Insert an IV into one of your veins. This may be used to give you fluids and medicines. Monitoring Your health care team may assess your contractions (uterine monitoring) and your baby's heart rate (fetal monitoring). You may need to be monitored: Often, but not continuously (intermittently). All the time or for long periods at a time (continuously). Continuous monitoring may be needed if: You are taking certain medicines, such as medicine to relieve pain or make your contractions stronger. You have pregnancy or labor complications. Monitoring may be done by: Placing a special stethoscope or a handheld monitoring device on your abdomen to check your baby's  heartbeat and to check for contractions. Placing monitors on your abdomen (external monitors) to record your baby's heartbeat and the frequency and length of contractions. Placing monitors inside your uterus through your vagina (internal monitors) to record your baby's heartbeat and the frequency, length, and strength of your contractions. Depending on the type of monitor, it may remain in your uterus or on your baby's head until birth. Telemetry. This is a type of continuous monitoring that can be done with external or internal monitors. Instead of having to stay in bed, you are able to move around. Physical exam Your health care team may perform frequent physical exams. This may include: Checking how and where your baby is positioned in your uterus. Checking your cervix to determine: Whether it is thinning out (effacing). Whether it is opening up (dilating). What happens during labor and delivery?  Normal labor and delivery is divided into the following three stages: Stage 1 This is the longest stage of labor. Throughout this stage, you will feel contractions. Contractions generally feel mild, infrequent, and irregular at first. They get stronger, more frequent, and more regular as you move through this stage. You may have contractions about every 2-3 minutes. This stage ends when your cervix is completely dilated to 4 inches (10 cm) and completely effaced. Stage 2 This stage starts once your cervix is completely effaced and dilated and lasts until the delivery of your baby. This is the stage where you will feel an urge to push your baby out of your vagina. You may feel stretching and burning pain, especially when the widest part of your baby's head passes through the vaginal opening (crowning). Once your baby is delivered, the umbilical cord will be   clamped and cut. Timing of cutting the cord will depend on your wishes, your baby's health, and your health care provider's practices. Your baby  will be placed on your bare chest (skin-to-skin contact) in an upright position and covered with a warm blanket. If you are choosing to breastfeed, watch your baby for feeding cues, like rooting or sucking, and help the baby to your breast for his or her first feeding. Stage 3 This stage starts immediately after the birth of your baby and ends after you deliver the placenta. This stage may take anywhere from 5 to 30 minutes. After your baby has been delivered, you will feel contractions as your body expels the placenta. These contractions also help your uterus get smaller and reduce bleeding. What can I expect after labor and delivery? After labor is over, you and your baby will be assessed closely until you are ready to go home. Your health care team will teach you how to care for yourself and your baby. You and your baby may be encouraged to stay in the same room (rooming in) during your hospital stay. This will help promote early bonding and successful breastfeeding. Your uterus will be checked and massaged regularly (fundal massage). You may continue to receive fluids and medicines through an IV. You will have some soreness and pain in your abdomen, vagina, and the area of skin between your vaginal opening and your anus (perineum). If an incision was made near your vagina (episiotomy) or if you had some vaginal tearing during delivery, cold compresses may be placed on your episiotomy or your tear. This helps to reduce pain and swelling. It is normal to have vaginal bleeding after delivery. Wear a sanitary pad for vaginal bleeding and discharge. Summary Vaginal delivery means that you will give birth by pushing your baby out of your birth canal (vagina). Your health care team will monitor you and your baby throughout the stages of labor. After you deliver your baby, your health care team will continue to assess you and your baby to ensure you are both recovering as expected after delivery. This  information is not intended to replace advice given to you by your health care provider. Make sure you discuss any questions you have with your health care provider. Document Revised: 12/21/2019 Document Reviewed: 12/21/2019 Elsevier Patient Education  2023 [employer] Inc.  

## 2022-01-16 ENCOUNTER — Telehealth (HOSPITAL_COMMUNITY): Payer: Self-pay | Admitting: *Deleted

## 2022-01-16 ENCOUNTER — Encounter (HOSPITAL_COMMUNITY): Payer: Self-pay | Admitting: *Deleted

## 2022-01-16 LAB — CBC
Hematocrit: 33.4 % — ABNORMAL LOW (ref 34.0–46.6)
Hemoglobin: 11 g/dL — ABNORMAL LOW (ref 11.1–15.9)
MCH: 26.1 pg — ABNORMAL LOW (ref 26.6–33.0)
MCHC: 32.9 g/dL (ref 31.5–35.7)
MCV: 79 fL (ref 79–97)
Platelets: 206 10*3/uL (ref 150–450)
RBC: 4.21 x10E6/uL (ref 3.77–5.28)
RDW: 12.5 % (ref 11.7–15.4)
WBC: 5.9 10*3/uL (ref 3.4–10.8)

## 2022-01-16 LAB — COMPREHENSIVE METABOLIC PANEL
ALT: 14 IU/L (ref 0–32)
AST: 15 IU/L (ref 0–40)
Albumin/Globulin Ratio: 1.5 (ref 1.2–2.2)
Albumin: 3.3 g/dL — ABNORMAL LOW (ref 3.9–4.9)
Alkaline Phosphatase: 147 IU/L — ABNORMAL HIGH (ref 44–121)
BUN/Creatinine Ratio: 13 (ref 9–23)
BUN: 13 mg/dL (ref 6–20)
Bilirubin Total: <0.2 mg/dL (ref 0.0–1.2)
CO2: 18 mmol/L — ABNORMAL LOW (ref 20–29)
Calcium: 9.2 mg/dL (ref 8.7–10.2)
Chloride: 106 mmol/L (ref 96–106)
Creatinine, Ser: 0.99 mg/dL (ref 0.57–1.00)
Globulin, Total: 2.2 g/dL (ref 1.5–4.5)
Glucose: 140 mg/dL — ABNORMAL HIGH (ref 70–99)
Potassium: 4.1 mmol/L (ref 3.5–5.2)
Sodium: 139 mmol/L (ref 134–144)
Total Protein: 5.5 g/dL — ABNORMAL LOW (ref 6.0–8.5)
eGFR: 77 mL/min/{1.73_m2} (ref >59)

## 2022-01-16 LAB — PROTEIN / CREATININE RATIO, URINE
Creatinine, Urine: 638.3 mg/dL
Protein, Ur: 605.1 mg/dL
Protein/Creat Ratio: 948 mg/g creat — ABNORMAL HIGH (ref 0–200)

## 2022-01-16 NOTE — Telephone Encounter (Signed)
Preadmission screen  

## 2022-01-18 ENCOUNTER — Inpatient Hospital Stay (HOSPITAL_COMMUNITY)
Admission: AD | Admit: 2022-01-18 | Discharge: 2022-01-18 | Disposition: A | Discharge status: Home / Self Care | Payer: Medicaid Other | Attending: Obstetrics and Gynecology | Admitting: Obstetrics and Gynecology

## 2022-01-18 DIAGNOSIS — O1403 Mild to moderate pre-eclampsia, third trimester: Secondary | ICD-10-CM | POA: Diagnosis not present

## 2022-01-18 DIAGNOSIS — Z3A36 36 weeks gestation of pregnancy: Secondary | ICD-10-CM | POA: Diagnosis not present

## 2022-01-18 DIAGNOSIS — N898 Other specified noninflammatory disorders of vagina: Secondary | ICD-10-CM | POA: Diagnosis not present

## 2022-01-18 DIAGNOSIS — O26893 Other specified pregnancy related conditions, third trimester: Secondary | ICD-10-CM | POA: Diagnosis not present

## 2022-01-18 NOTE — MAU Provider Note (Signed)
Event Date/Time   First Provider Initiated Contact with Patient 01/18/22 2009      S [name] is a 31 y.o. G3P0110 patient who presents to MAU today with complaint of mucous plug coming out.  States phone nurse told her to come in. She does not think she needs to be seen.  Has known preeclampsia and is scheduled to be induced next week. Has MFM appt tomorrow. Denies HA, visual changes or abd pain. Good Fetal Movement. .   RN Note: Alicia Terry is a 31 y.o. at 15w2dhere in MAU reporting: lost her mucous plug, was told to come into MAU by nurse line.  Denies vaginal bleeding, leaking of fluid, or ctx. Reports vaginal pressure.  Denies headache, visual changes, or epigastric pain. Reports she has induction scheduled on 12/20 +FM  O BP (!) 143/89 (BP Location: Right Arm)   Pulse 91   Temp 98.1 F (36.7 C) (Oral)   Resp 20   Ht '5\' 8"'$  (1.727 m)   Wt (!) 142.7 kg   LMP 05/09/2021 (Exact Date)   SpO2 100%   BMI 47.82 kg/m  Physical Exam Constitutional:      General: She is not in acute distress.    Appearance: She is not ill-appearing or toxic-appearing.  HENT:     Head: Normocephalic.  Cardiovascular:     Rate and Rhythm: Normal rate.  Pulmonary:     Effort: Pulmonary effort is normal.  Abdominal:     General: There is no distension.     Tenderness: There is no abdominal tenderness. There is no guarding.  Musculoskeletal:        General: Normal range of motion.     Cervical back: Normal range of motion.  Skin:    General: Skin is warm and dry.  Neurological:     General: No focal deficit present.     Mental Status: She is alert.  Psychiatric:        Mood and Affect: Mood normal.   FHT 153  MDM:  Discussed with Dr EDione Plover  Since patient asymptomatic and BP stable may discharge home Discussed signs of worsening preeclampsia to report and return for.  Discussed signs of labor and Fetal Movement.   A Medical screening exam complete Single IUP at  354w2ducous plug came out Mild preeclampsia  P Discharge from MAU in stable condition Patient given the option of further evaluation here, declines.   Followup in MFM tomorrow as scheduled Warning signs for worsening condition that would warrant emergency follow-up discussed Patient may return to MAU as needed   WiSeabron SpatesCNM 01/18/2022 8:10 PM

## 2022-01-18 NOTE — MAU Note (Signed)
..  Alicia Terry is a 31 y.o. at 46w2dhere in MAU reporting: lost her mucous plug, was told to come into MAU by nurse line.  Denies vaginal bleeding, leaking of fluid, or ctx. Reports vaginal pressure.  Denies headache, visual changes, or epigastric pain. Reports she has induction scheduled on 12/20 +FM  Pain score: 2/10 Vitals:   01/18/22 2001  BP: (!) 143/89  Pulse: 91  Resp: 20  Temp: 98.1 F (36.7 C)  SpO2: 100%     FHT:153

## 2022-01-19 ENCOUNTER — Ambulatory Visit: Payer: Medicaid Other | Admitting: *Deleted

## 2022-01-19 ENCOUNTER — Ambulatory Visit: Payer: Medicaid Other | Attending: Obstetrics

## 2022-01-19 VITALS — BP 146/96 | HR 92

## 2022-01-19 DIAGNOSIS — O24410 Gestational diabetes mellitus in pregnancy, diet controlled: Secondary | ICD-10-CM

## 2022-01-19 DIAGNOSIS — O99213 Obesity complicating pregnancy, third trimester: Secondary | ICD-10-CM

## 2022-01-19 DIAGNOSIS — O09293 Supervision of pregnancy with other poor reproductive or obstetric history, third trimester: Secondary | ICD-10-CM

## 2022-01-19 DIAGNOSIS — O09893 Supervision of other high risk pregnancies, third trimester: Secondary | ICD-10-CM | POA: Diagnosis not present

## 2022-01-19 DIAGNOSIS — O403XX0 Polyhydramnios, third trimester, not applicable or unspecified: Secondary | ICD-10-CM | POA: Diagnosis not present

## 2022-01-19 DIAGNOSIS — E669 Obesity, unspecified: Secondary | ICD-10-CM | POA: Diagnosis not present

## 2022-01-19 DIAGNOSIS — O09213 Supervision of pregnancy with history of pre-term labor, third trimester: Secondary | ICD-10-CM | POA: Diagnosis not present

## 2022-01-19 DIAGNOSIS — O3660X0 Maternal care for excessive fetal growth, unspecified trimester, not applicable or unspecified: Secondary | ICD-10-CM

## 2022-01-19 DIAGNOSIS — O3663X0 Maternal care for excessive fetal growth, third trimester, not applicable or unspecified: Secondary | ICD-10-CM | POA: Insufficient documentation

## 2022-01-19 DIAGNOSIS — Z3A36 36 weeks gestation of pregnancy: Secondary | ICD-10-CM

## 2022-01-19 DIAGNOSIS — O0990 Supervision of high risk pregnancy, unspecified, unspecified trimester: Secondary | ICD-10-CM

## 2022-01-19 DIAGNOSIS — O3660X1 Maternal care for excessive fetal growth, unspecified trimester, fetus 1: Secondary | ICD-10-CM

## 2022-01-19 DIAGNOSIS — O1403 Mild to moderate pre-eclampsia, third trimester: Secondary | ICD-10-CM

## 2022-01-22 ENCOUNTER — Encounter: Payer: Self-pay | Admitting: Obstetrics and Gynecology

## 2022-01-22 ENCOUNTER — Encounter (HOSPITAL_COMMUNITY): Payer: Self-pay | Admitting: Obstetrics & Gynecology

## 2022-01-22 ENCOUNTER — Inpatient Hospital Stay (HOSPITAL_COMMUNITY)
Admission: AD | Admit: 2022-01-22 | Discharge: 2022-01-25 | DRG: 787 | Discharge status: Against Medical Advice | Payer: Medicaid Other | Attending: Obstetrics & Gynecology | Admitting: Obstetrics & Gynecology

## 2022-01-22 ENCOUNTER — Other Ambulatory Visit: Payer: Self-pay

## 2022-01-22 DIAGNOSIS — D252 Subserosal leiomyoma of uterus: Secondary | ICD-10-CM | POA: Diagnosis not present

## 2022-01-22 DIAGNOSIS — O24425 Gestational diabetes mellitus in childbirth, controlled by oral hypoglycemic drugs: Secondary | ICD-10-CM | POA: Diagnosis present

## 2022-01-22 DIAGNOSIS — O403XX0 Polyhydramnios, third trimester, not applicable or unspecified: Secondary | ICD-10-CM | POA: Diagnosis present

## 2022-01-22 DIAGNOSIS — Z148 Genetic carrier of other disease: Secondary | ICD-10-CM

## 2022-01-22 DIAGNOSIS — Z3A37 37 weeks gestation of pregnancy: Secondary | ICD-10-CM | POA: Diagnosis not present

## 2022-01-22 DIAGNOSIS — O1493 Unspecified pre-eclampsia, third trimester: Secondary | ICD-10-CM | POA: Diagnosis not present

## 2022-01-22 DIAGNOSIS — O99214 Obesity complicating childbirth: Secondary | ICD-10-CM | POA: Diagnosis not present

## 2022-01-22 DIAGNOSIS — Z5329 Procedure and treatment not carried out because of patient's decision for other reasons: Secondary | ICD-10-CM | POA: Diagnosis present

## 2022-01-22 DIAGNOSIS — O1414 Severe pre-eclampsia complicating childbirth: Principal | ICD-10-CM | POA: Diagnosis present

## 2022-01-22 DIAGNOSIS — Z8249 Family history of ischemic heart disease and other diseases of the circulatory system: Secondary | ICD-10-CM | POA: Diagnosis not present

## 2022-01-22 DIAGNOSIS — O24410 Gestational diabetes mellitus in pregnancy, diet controlled: Secondary | ICD-10-CM

## 2022-01-22 DIAGNOSIS — D62 Acute posthemorrhagic anemia: Secondary | ICD-10-CM | POA: Diagnosis not present

## 2022-01-22 DIAGNOSIS — Z833 Family history of diabetes mellitus: Secondary | ICD-10-CM

## 2022-01-22 DIAGNOSIS — Z8759 Personal history of other complications of pregnancy, childbirth and the puerperium: Secondary | ICD-10-CM | POA: Diagnosis present

## 2022-01-22 DIAGNOSIS — O3413 Maternal care for benign tumor of corpus uteri, third trimester: Secondary | ICD-10-CM | POA: Diagnosis not present

## 2022-01-22 DIAGNOSIS — O0990 Supervision of high risk pregnancy, unspecified, unspecified trimester: Secondary | ICD-10-CM

## 2022-01-22 DIAGNOSIS — O26893 Other specified pregnancy related conditions, third trimester: Secondary | ICD-10-CM | POA: Diagnosis not present

## 2022-01-22 DIAGNOSIS — O3663X0 Maternal care for excessive fetal growth, third trimester, not applicable or unspecified: Secondary | ICD-10-CM | POA: Diagnosis present

## 2022-01-22 DIAGNOSIS — Z98891 History of uterine scar from previous surgery: Secondary | ICD-10-CM

## 2022-01-22 DIAGNOSIS — O403XX1 Polyhydramnios, third trimester, fetus 1: Secondary | ICD-10-CM | POA: Diagnosis not present

## 2022-01-22 DIAGNOSIS — O9081 Anemia of the puerperium: Secondary | ICD-10-CM | POA: Diagnosis not present

## 2022-01-22 DIAGNOSIS — Z3A36 36 weeks gestation of pregnancy: Secondary | ICD-10-CM | POA: Diagnosis not present

## 2022-01-22 LAB — COMPREHENSIVE METABOLIC PANEL
ALT: 20 U/L (ref 0–44)
AST: 16 U/L (ref 15–41)
Albumin: 2.6 g/dL — ABNORMAL LOW (ref 3.5–5.0)
Alkaline Phosphatase: 129 U/L — ABNORMAL HIGH (ref 38–126)
Anion gap: 8 (ref 5–15)
BUN: 9 mg/dL (ref 6–20)
CO2: 21 mmol/L — ABNORMAL LOW (ref 22–32)
Calcium: 9 mg/dL (ref 8.9–10.3)
Chloride: 108 mmol/L (ref 98–111)
Creatinine, Ser: 0.82 mg/dL (ref 0.44–1.00)
GFR, Estimated: >60 mL/min (ref >60)
Glucose, Bld: 115 mg/dL — ABNORMAL HIGH (ref 70–99)
Potassium: 4 mmol/L (ref 3.5–5.1)
Sodium: 137 mmol/L (ref 135–145)
Total Bilirubin: 0.2 mg/dL — ABNORMAL LOW (ref 0.3–1.2)
Total Protein: 5.9 g/dL — ABNORMAL LOW (ref 6.5–8.1)

## 2022-01-22 LAB — CBC
HCT: 33.7 % — ABNORMAL LOW (ref 36.0–46.0)
Hemoglobin: 11.1 g/dL — ABNORMAL LOW (ref 12.0–15.0)
MCH: 26.8 pg (ref 26.0–34.0)
MCHC: 32.9 g/dL (ref 30.0–36.0)
MCV: 81.4 fL (ref 80.0–100.0)
Platelets: 210 10*3/uL (ref 150–400)
RBC: 4.14 MIL/uL (ref 3.87–5.11)
RDW: 13.2 % (ref 11.5–15.5)
WBC: 5.8 10*3/uL (ref 4.0–10.5)
nRBC: 0 % (ref 0.0–0.2)

## 2022-01-22 LAB — POCT FERN TEST

## 2022-01-22 LAB — GLUCOSE, CAPILLARY
Glucose-Capillary: 99 mg/dL (ref 70–99)
Glucose-Capillary: 134 mg/dL — ABNORMAL HIGH (ref 70–99)
Glucose-Capillary: 94 mg/dL (ref 70–99)
Glucose-Capillary: 61 mg/dL — ABNORMAL LOW (ref 70–99)
Glucose-Capillary: 71 mg/dL (ref 70–99)
Glucose-Capillary: 110 mg/dL — ABNORMAL HIGH (ref 70–99)

## 2022-01-22 LAB — RPR: RPR Ser Ql: NONREACTIVE

## 2022-01-22 LAB — TYPE AND SCREEN
ABO/RH(D): O POS
Antibody Screen: NEGATIVE

## 2022-01-22 LAB — PROTEIN / CREATININE RATIO, URINE
Creatinine, Urine: 121 mg/dL
Protein Creatinine Ratio: 1.21 mg/mg{Cre} — ABNORMAL HIGH (ref 0.00–0.15)
Total Protein, Urine: 147 mg/dL

## 2022-01-22 MED ORDER — SODIUM CHLORIDE 0.9 % IV SOLN
5.0000 10*6.[IU] | Freq: Once | INTRAVENOUS | Status: AC
  Administered 2022-01-22: 5 10*6.[IU] via INTRAVENOUS
  Filled 2022-01-22: qty 5

## 2022-01-22 MED ORDER — OXYTOCIN-SODIUM CHLORIDE 30-0.9 UT/500ML-% IV SOLN
2.5000 [IU]/h | INTRAVENOUS | Status: DC
  Filled 2022-01-22: qty 500

## 2022-01-22 MED ORDER — ACETAMINOPHEN 325 MG PO TABS
650.0000 mg | ORAL_TABLET | ORAL | Status: DC | PRN
  Administered 2022-01-22: 650 mg via ORAL
  Filled 2022-01-22: qty 2

## 2022-01-22 MED ORDER — LABETALOL HCL 5 MG/ML IV SOLN
20.0000 mg | INTRAVENOUS | Status: DC | PRN
  Administered 2022-01-22: 20 mg via INTRAVENOUS
  Filled 2022-01-22: qty 4

## 2022-01-22 MED ORDER — OXYCODONE-ACETAMINOPHEN 5-325 MG PO TABS: 1.0000 | ORAL_TABLET | ORAL | Status: DC | PRN

## 2022-01-22 MED ORDER — LACTATED RINGERS IV SOLN: INTRAVENOUS | Status: DC

## 2022-01-22 MED ORDER — LABETALOL HCL 5 MG/ML IV SOLN: 80.0000 mg | INTRAVENOUS | Status: DC | PRN

## 2022-01-22 MED ORDER — LACTATED RINGERS IV SOLN
500.0000 mL | INTRAVENOUS | Status: DC | PRN
  Administered 2022-01-22: 1000 mL via INTRAVENOUS

## 2022-01-22 MED ORDER — MISOPROSTOL 50MCG HALF TABLET
50.0000 ug | ORAL_TABLET | Freq: Once | ORAL | Status: AC
  Administered 2022-01-22: 50 ug via BUCCAL
  Filled 2022-01-22: qty 1

## 2022-01-22 MED ORDER — TERBUTALINE SULFATE 1 MG/ML IJ SOLN: 0.2500 mg | Freq: Once | INTRAMUSCULAR | Status: DC | PRN

## 2022-01-22 MED ORDER — PENICILLIN G POT IN DEXTROSE 60000 UNIT/ML IV SOLN
3.0000 10*6.[IU] | INTRAVENOUS | Status: DC
  Administered 2022-01-22 – 2022-01-23 (×5): 3 10*6.[IU] via INTRAVENOUS
  Filled 2022-01-22 (×5): qty 50

## 2022-01-22 MED ORDER — OXYCODONE-ACETAMINOPHEN 5-325 MG PO TABS: 2.0000 | ORAL_TABLET | ORAL | Status: DC | PRN

## 2022-01-22 MED ORDER — OXYTOCIN BOLUS FROM INFUSION: 333.0000 mL | Freq: Once | INTRAVENOUS | Status: DC

## 2022-01-22 MED ORDER — LABETALOL HCL 5 MG/ML IV SOLN
40.0000 mg | INTRAVENOUS | Status: DC | PRN
  Administered 2022-01-22: 40 mg via INTRAVENOUS
  Filled 2022-01-22: qty 8

## 2022-01-22 MED ORDER — FENTANYL CITRATE (PF) 100 MCG/2ML IJ SOLN
50.0000 ug | INTRAMUSCULAR | Status: DC | PRN
  Administered 2022-01-23: 50 ug via INTRAVENOUS
  Filled 2022-01-22: qty 2

## 2022-01-22 MED ORDER — SOD CITRATE-CITRIC ACID 500-334 MG/5ML PO SOLN: 30.0000 mL | ORAL | Status: DC | PRN

## 2022-01-22 MED ORDER — MAGNESIUM SULFATE BOLUS VIA INFUSION
4.0000 g | Freq: Once | INTRAVENOUS | Status: AC
  Administered 2022-01-22: 4 g via INTRAVENOUS
  Filled 2022-01-22: qty 1000

## 2022-01-22 MED ORDER — LIDOCAINE HCL (PF) 1 % IJ SOLN: 30.0000 mL | INTRAMUSCULAR | Status: DC | PRN

## 2022-01-22 MED ORDER — OXYTOCIN-SODIUM CHLORIDE 30-0.9 UT/500ML-% IV SOLN
1.0000 m[IU]/min | INTRAVENOUS | Status: DC
  Administered 2022-01-22: 2 m[IU]/min via INTRAVENOUS

## 2022-01-22 MED ORDER — FLEET ENEMA 7-19 GM/118ML RE ENEM: 1.0000 | ENEMA | RECTAL | Status: DC | PRN

## 2022-01-22 MED ORDER — MAGNESIUM SULFATE 40 GM/1000ML IV SOLN
2.0000 g/h | INTRAVENOUS | Status: DC
  Administered 2022-01-22: 2 g/h via INTRAVENOUS
  Filled 2022-01-22 (×2): qty 1000

## 2022-01-22 MED ORDER — ONDANSETRON HCL 4 MG/2ML IJ SOLN: 4.0000 mg | Freq: Four times a day (QID) | INTRAMUSCULAR | Status: DC | PRN

## 2022-01-22 MED ORDER — NIFEDIPINE ER OSMOTIC RELEASE 30 MG PO TB24
30.0000 mg | ORAL_TABLET | Freq: Two times a day (BID) | ORAL | Status: DC
  Administered 2022-01-22: 30 mg via ORAL
  Filled 2022-01-22: qty 1

## 2022-01-22 MED ORDER — HYDRALAZINE HCL 20 MG/ML IJ SOLN: 10.0000 mg | INTRAMUSCULAR | Status: DC | PRN

## 2022-01-22 MED ORDER — MAGNESIUM SULFATE BOLUS VIA INFUSION: 6.0000 g | Freq: Once | INTRAVENOUS | Status: DC

## 2022-01-22 NOTE — MAU Provider Note (Signed)
History     CSN: [id number]  Arrival date and time: 01/22/22 0557   Event Date/Time   First Provider Initiated Contact with Patient 01/22/22 [phone]      Chief Complaint  Patient presents with   Rupture of Membranes   HPI [name] is a 31 y.o. year old G42P0110 female at 39w6dweeks gestation who presents to MAU reporting sudden gush of clear, thin fluid around 0400 this morning. She also endorses contractions every 5-7 minutes. Denies VB. Endorses good (+) FM. Denies H/A, visual disturbances and RUQ pain. Her pregnancy is complicated by morbid obesity, GDMA1 and PEC. She takes Procardia 30 mg BID; last dose was yesterday afternoon. [facility] Her FOB is present and contributing to the history taking.   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   pos HSV 1 IgG   Pregnancy induced hypertension     Past Surgical History:  Procedure Laterality Date   CYST EXCISION     Chest   WISDOM TOOTH EXTRACTION      Family History  Problem Relation Age of Onset   Diabetes Mother    Hypertension Mother    Healthy Father    Diabetes Sister    Heart disease Maternal Grandmother    Hypertension Maternal Grandmother    Depression Maternal Grandmother    Heart disease Maternal Grandfather    Hypertension Maternal Grandfather    Depression Maternal Grandfather    Dementia Paternal Grandmother     Social History   Tobacco Use   Smoking status: Never   Smokeless tobacco: Never  Vaping Use   Vaping Use: Never used  Substance Use Topics   Alcohol use: Not Currently   Drug use: Not Currently    Types: Marijuana     Comment: last use may 2023    Allergies: No Known Allergies  Medications Prior to Admission  Medication Sig Dispense Refill Last Dose   Cholecalciferol (VITAMIN D3 GUMMIES ADULT PO) Take by mouth.   01/21/2022   metFORMIN (GLUCOPHAGE) 500 MG tablet Take 1 tablet (500 mg total) by mouth 2 (two) times daily with a meal. 60 tablet 5 01/21/2022 at 0800   NIFEdipine (PROCARDIA-XL/NIFEDICAL-XL) 30 MG 24 hr tablet Take 1 tablet (30 mg total) by mouth daily. 90 tablet 3 01/21/2022 at 1500   Prenatal Vit-Fe Fumarate-FA (MULTIVITAMIN-PRENATAL) 27-0.8 MG TABS tablet Take 1 tablet by mouth daily at 12 noon.   01/21/2022   Accu-Chek Softclix Lancets lancets Use as instructed; check blood glucose 4 times daily 100 each 12    aspirin EC 81 MG tablet Take 1 tablet (81 mg total) by mouth daily. Take after 12 weeks for prevention of preeclampsia later in pregnancy (Patient not taking: Reported on 01/12/2022) 300 tablet 2    Blood Glucose Monitoring Suppl (ACCU-CHEK GUIDE ME) w/Device KIT See admin instructions.  glucose blood (ACCU-CHEK GUIDE) test strip Use as instructed; check blood glucose 4 times daily 100 each 11     Review of Systems  Constitutional: Negative.   HENT: Negative.    Eyes: Negative.   Respiratory: Negative.    Cardiovascular: Negative.   Gastrointestinal: Negative.   Endocrine: Negative.   Genitourinary:  Positive for pelvic pain (contractions every 5-7 mins) and vaginal discharge (leaking clear fluid since 0400).  Musculoskeletal: Negative.   Skin: Negative.   Allergic/Immunologic: Negative.   Neurological: Negative.   Hematological: Negative.   Psychiatric/Behavioral: Negative.     Physical Exam   Patient Vitals for the past 24 hrs:  BP Pulse Resp SpO2  01/22/22 0730 (!) 167/96 84 -- 99 %  01/22/22 0715 (!) 170/113 88 -- 100 %  01/22/22 0700 (!) 159/108 86 -- 99 %  01/22/22 0631 (!) 155/102 89 -- --  01/22/22 0623 (!) 161/103 91 18 --    Physical Exam Vitals and  nursing note reviewed.  Constitutional:      Appearance: Normal appearance. She is obese.  Cardiovascular:     Rate and Rhythm: Normal rate.  Pulmonary:     Effort: Pulmonary effort is normal.  Abdominal:     Palpations: Abdomen is soft.  Musculoskeletal:        General: Normal range of motion.  Skin:    General: Skin is warm and dry.  Neurological:     Mental Status: She is alert and oriented to person, place, and time.  Psychiatric:        Mood and Affect: Mood normal.        Behavior: Behavior normal.        Thought Content: Thought content normal.        Judgment: Judgment normal.    REACTIVE NST - FHR: 140 bpm / moderate variability / accels present / decels absent / TOCO: regular every 1-3 mins  MAU Course  Procedures Patient informed that the ultrasound is considered a limited OB ultrasound and is not intended to be a complete ultrasound exam.  Patient also informed that the ultrasound is not being completed with the intent of assessing for fetal or placental anomalies or any pelvic abnormalities.  Explained that the purpose of today's ultrasound is to assess for presentation.  Baby was found to be in a cephalic presentation. Patient acknowledges the purpose of the exam and the limitations of the study.  MDM CCUA CBC CMP P/C Ratio Serial BP's  Insert IV Labetalol Protocol MgSO4 4 gm bolus prior to admission d/t continued severe range BPs after Labetalol  Results for orders placed or performed during the hospital encounter of 01/22/22 (from the past 24 hour(s))  POCT fern test     Status: Abnormal   Collection Time: 01/22/22  6:36 AM  Result Value Ref Range   POCT Fern Test     Positive    Type and screen     Status: None (Preliminary result)   Collection Time: 01/22/22  6:52 AM  Result Value Ref Range   ABO/RH(D) PENDING    Antibody Screen PENDING    Sample Expiration      01/25/2022,2359 Performed at [facility], [place] [address], [place], [place]  [address]       Assessment and Plan  1. Preeclampsia, third trimester - Admit to L&D  Routine admission orders - IOL for PEC and GDMA1 - Type and screen; Standing - RPR; Standing - Type and screen - RPR  2. Pre-eclampsia in third trimester - MgSO4 4 gm bolus  3. Diet controlled gestational diabetes mellitus (GDM) in third trimester - Per Protocol  4. [redacted] weeks gestation of pregnancy    Laury Deep, CNM 01/22/2022, 6:54 AM

## 2022-01-22 NOTE — Progress Notes (Signed)
Alicia Terry is a 31 y.o. G3P0110 at [redacted]w[redacted]d admitted for induction of labor due to Pre-eclamptic toxemia of pregnancy..  Subjective: Doing well at this time.  Walking the halls.  No acute distress.  Objective: BP (!) 140/83 (BP Location: Left Arm)   Pulse 91   Temp 98.3 F (36.8 C) (Oral)   Resp 16   Ht '5\' 8"'$  (1.727 m)   Wt (!) 142.7 kg   LMP 05/09/2021 (Exact Date)   SpO2 97%   BMI 47.82 kg/m  I/O last 3 completed shifts: In: 2400 [P.O.:900; I.V.:1137.5; IV Piggyback:362.5] Out: 1400 [Urine:1400] No intake/output data recorded.  FHT:  FHR: 140 bpm, variability: moderate,  accelerations:  Present,  decelerations:  Absent UC:   irregular, every 2-8 minutes SVE:   Dilation: 4 Effacement (%): 60 Station: Ballotable Exam by:: Benjamine Strout, MD  Labs: Lab Results  Component Value Date   WBC 5.8 01/22/2022   HGB 11.1 (L) 01/22/2022   HCT 33.7 (L) 01/22/2022   MCV 81.4 01/22/2022   PLT 210 01/22/2022    Assessment / Plan: IOL 2/2 Pre-eclampsia with severe features Foley bulb out since last check.  Labor: Labor is progressing.  Was on Pitocin but fetal heart rate having recurrent decelerations so this is discontinued.  Will wait 1 to 2 hours and restart as able Preeclampsia:  on magnesium sulfate Fetal Wellbeing: Category 2, discussed with attending Pain Control:  Labor support without medications I/D:  n/a Anticipated MOD:  NSVD   JConcepcion Living 01/22/22  11:59 PM

## 2022-01-22 NOTE — MAU Note (Signed)
.  Alicia Terry is a 31 y.o. at 30w6dhere in MAU reporting: LOF starting around 0400 this morning, reports clear thin fluid. Also reports contractions every 5-774mutes. Reports no VB and endorses + FM. Denies headache, visual disturbances and RUQ pain.   OB hx per patient includes GDM (diet-controlled), pre-eclampsia, rx: taking procardia 2x daily.    Onset of complaint: 0400 12/18  Pain score: 5/10 There were no vitals filed for this visit.   FHT:165 Lab orders placed from triage:  labor eval

## 2022-01-22 NOTE — Progress Notes (Signed)
Hypoglycemic Event  CBG: 61  Treatment: 8 oz juice/soda  Symptoms: None  Follow-up CBG: Time:1827 CBG Result:71  Possible Reasons for Event: Inadequate meal intake  Comments/MD notified:Dr. Pray notified of CBG, no new orders received    Lanny Cramp

## 2022-01-22 NOTE — Progress Notes (Signed)
Alicia Terry is a 31 y.o. G3P0110 at [redacted]w[redacted]d admitted for induction of labor due to Pre-eclamptic toxemia of pregnancy..  Subjective: Coping well with contractions.   Objective: BP (!) 147/104   Pulse 93   Temp 98.3 F (36.8 C) (Oral)   Resp 18   Ht '5\' 8"'$  (1.727 m)   Wt (!) 142.7 kg   LMP 05/09/2021 (Exact Date)   SpO2 97%   BMI 47.82 kg/m  I/O last 3 completed shifts: In: 2228.3 [P.O.:900; I.V.:1012.5; IV Piggyback:315.8] Out: 1400 [Urine:1400] No intake/output data recorded.  FHT:  FHR: 140 bpm, variability: moderate,  accelerations:  Present,  decelerations:  Absent UC:   irregular, every 2-8 minutes SVE:   Dilation: 2 Effacement (%): 60 Station: -3 Exam by:: HCarlton AdamCNM  Procedure: Patient informed of R/B/A of procedure.  Patient has been on the monitor with a reactive tracing as of the start of the procedure.   Procedure done to begin ripening of the cervix for induction of labor. Appropriate time out taken. The patient was placed in the lithotomy position and a cervical exam was performed and a finger was used to guide the 51F foley through the internal os of the cervix. Foley Balloon filled with 60cc of normal saline. Plug inserted into end of the foley. Foley placed on tension and taped to medial thigh.    Labs: Lab Results  Component Value Date   WBC 5.8 01/22/2022   HGB 11.1 (L) 01/22/2022   HCT 33.7 (L) 01/22/2022   MCV 81.4 01/22/2022   PLT 210 01/22/2022    Assessment / Plan: IOL 2/2 Pre-eclampsia with severe features Has had cytotec and now FB placement Will start pitocin now  Labor: Progressing normally Preeclampsia:  on magnesium sulfate Fetal Wellbeing:  Category I Pain Control:  Labor support without medications I/D:  n/a Anticipated MOD:  NSVD   HMarcille BuffyDNP, CNM  01/22/22  7:24 PM

## 2022-01-22 NOTE — Progress Notes (Signed)
Labor Progress Note Alicia Terry is a 31 y.o. G3P0110 at 12w6dpresented for SROM and pre-eclampsia with severe features.  S: Patient is resting comfortably. Feeling some minor cramping but no regular pain. Still gushing lots of clear fluid. OK with foley bulb placement  O:  BP 105/84   Pulse 83   Temp 98.4 F (36.9 C) (Oral)   Resp 15   Ht '5\' 8"'$  (1.727 m)   Wt (!) 142.7 kg   LMP 05/09/2021 (Exact Date)   SpO2 97%   BMI 47.82 kg/m  EFM: 135 baseline/moderate variability/+accels, no decels Toco: q6-7 minutes, patient not feeling them  CVE: Dilation: 2 Effacement (%): 60 Presentation: Vertex Exam by:: Dr. PThompson Grayer  A&P: 31y.o. [id number] with SROM and pre-E with severe features. #Labor: Progressing well. SVE unchanged s/p cytotec x1, attempted FB with speculum and blind and unable to place, will do another cytotec [id number] and reassess in 4 hours.  #Pain: PRN IV fentanyl #FWB: cat I, reassuring #GBS  unknown- PCN as preterm  #Pre-eclampsia with severe features- based on severe range blood pressures, Pre-E labs normal, admission UPCR pending, previously 949 mg/g. Asymptomatic, on Magnesium with 4g bolus given, continue 2 gm/hr per protocol. Continued on home procardia '30mg'$  BID per discussion with Dr EckDione Ploveronsider redosing her PM dose if BP appropriate. Most recent BP normotensive, has received IV labetalol '20mg'$  at 0720 am and IV labetalol 40 mg at 0750 AM but none since, continue to monitor BP and use IV antihypertensives per protocol.   #Early onset Gestational Diabetes- failed 2 hr GTT at 14 WGA and A1c 5.7. LGA infant EFW > 99th%ile with polyhydramnios 25.5. q4 hr POC BG checks while in latent labor, most recent 94.  MarLenoria ChimeD [facility] for [id number] PM

## 2022-01-22 NOTE — H&P (Addendum)
OBSTETRIC ADMISSION HISTORY AND PHYSICAL  Alicia Terry is a 31 y.o. female G81P0110 with IUP at 25w6dby LMP presenting for SROM around 0400 on 01/22/22 and pre-eclampsia with severe features. She reports +FMs, No LOF, no VB, no blurry vision, headaches or peripheral edema, and RUQ pain.  She plans on Breast feeding. She requests POPs for birth control. 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   pos HSV 1 IgG   Pregnancy induced hypertension     Past Surgical History: Past Surgical History:  Procedure Laterality Date   CYST EXCISION     Chest   WISDOM TOOTH EXTRACTION      Obstetrical History: OB History     Gravida  3   Para  1   Term  0   Preterm  1   AB  1   Living  0      SAB  0   IAB  0   Ectopic  1   Multiple  0   Live Births  0           Social History Social History   Socioeconomic History   Marital status: Single    Spouse name: Not on file   Number of children: Not on file   Years of education: Not on file   Highest education level: Not on file  Occupational History   Occupation: Beautician  Tobacco Use   Smoking status: Never   Smokeless tobacco: Never  Vaping Use   Vaping Use: Never used  Substance and Sexual Activity   Alcohol use: Not Currently   Drug use: Not Currently    Types: Marijuana    Comment: last use may 2023   Sexual activity: Yes    Birth  control/protection: None    Comment: sexually active yesterday  Other Topics Concern   Not on file  Social History Narrative   Not on file   Social Determinants of Health   Financial Resource Strain: Low Risk  (08/05/2020)   Overall Financial Resource Strain (CARDIA)    Difficulty of Paying Living Expenses: Not hard at all  Food Insecurity: No Food Insecurity (01/22/2022)   Hunger Vital Sign    Worried About Running Out of Food in the Last Year: Never true    Ran Out of Food in the Last Year: Never true  Transportation Needs: No Transportation Needs (01/22/2022)   PRAPARE - THydrologist(Medical): No    Lack of Transportation (Non-Medical): No  Physical Activity: Sufficiently Active (08/05/2020)   Exercise Vital Sign    Days of Exercise per Week: 7 days    Minutes of Exercise per Session: 30 min  Stress: No Stress Concern Present (08/05/2020)   FGrannis   Feeling of Stress : Only a little  Social Connections: Socially Isolated (08/05/2020)  Social Licensed conveyancer [NHANES]    Frequency of Communication with Friends and Family: Twice a week    Frequency of Social Gatherings with Friends and Family: Twice a week    Attends Religious Services: Never    Printmaker: No    Attends Music therapist: Never    Marital Status: Never married    Family History: Family History  Problem Relation Age of Onset   Diabetes Mother    Hypertension Mother    Healthy Father    Diabetes Sister    Heart disease Maternal Grandmother    Hypertension Maternal Grandmother    Depression Maternal Grandmother    Heart disease Maternal Grandfather    Hypertension Maternal Grandfather    Depression Maternal Grandfather    Dementia Paternal Grandmother     Allergies: No Known Allergies  Pt denies allergies to latex, iodine, or  shellfish.  Medications Prior to Admission  Medication Sig Dispense Refill Last Dose   Cholecalciferol (VITAMIN D3 GUMMIES ADULT PO) Take by mouth.   01/21/2022   metFORMIN (GLUCOPHAGE) 500 MG tablet Take 1 tablet (500 mg total) by mouth 2 (two) times daily with a meal. 60 tablet 5 01/21/2022 at 0800   NIFEdipine (PROCARDIA-XL/NIFEDICAL-XL) 30 MG 24 hr tablet Take 1 tablet (30 mg total) by mouth daily. 90 tablet 3 01/21/2022 at 1500   Prenatal Vit-Fe Fumarate-FA (MULTIVITAMIN-PRENATAL) 27-0.8 MG TABS tablet Take 1 tablet by mouth daily at 12 noon.   01/21/2022   Accu-Chek Softclix Lancets lancets Use as instructed; check blood glucose 4 times daily 100 each 12    aspirin EC 81 MG tablet Take 1 tablet (81 mg total) by mouth daily. Take after 12 weeks for prevention of preeclampsia later in pregnancy (Patient not taking: Reported on 01/12/2022) 300 tablet 2    Blood Glucose Monitoring Suppl (ACCU-CHEK GUIDE ME) w/Device KIT See admin instructions.      glucose blood (ACCU-CHEK GUIDE) test strip Use as instructed; check blood glucose 4 times daily 100 each 11      Review of Systems   All systems reviewed and negative except as stated in HPI  Blood pressure (!) 142/87, pulse 89, temperature 98.6 F (37 C), temperature source Oral, resp. rate 15, height _0  (1.727 m), weight (!) 142.7 kg, last menstrual period 05/09/2021, SpO2 97 %. General appearance: alert, cooperative, and appears stated age Lungs: normal work of breathing Abdomen: gravid Extremities: Homans sign is negative, no sign of DVT DTR's 2+ bilaterally Presentation: cephalic by BSUS Fetal monitoringBaseline: 125 bpm, Variability: Good {> 6 bpm), Accelerations: Reactive, and Decelerations: Absent Uterine activity: q5-6 minutes, not strong Dilation: 2 Effacement (%): 60 Exam by:: Dr. Thompson Grayer   Prenatal labs: ABO, Rh: --/--/O POS (12/18 2671) Antibody: NEG (12/18 2458) Rubella: 2.83 (06/22 1544) RPR: Non Reactive (10/16 1426)   HBsAg: Negative (06/22 1544)  HIV: Non Reactive (10/16 1426)  GBS:   unknown 2 hr Glucola: failed Genetic screening:  LR NIPS, alpha thal silent carrier, increased SMA risk (partner negative) Anatomy US: normal  Prenatal Transfer Tool  Maternal Diabetes: Yes:  Diabetes Type:  Insulin/Medication controlled Genetic Screening: abnormal- alpha thal silent carrier, SMA carrier Maternal Ultrasounds/Referrals: Normal Fetal Ultrasounds or other Referrals:  None Maternal Substance Abuse:  No Significant Maternal Medications:  Meds include: Other: procardia 23m BID, metformin daily Significant Maternal Lab Results: None GBS unknown  Results for orders placed or performed during the hospital encounter of 01/22/22 (from the  past 24 hour(s))  POCT fern test   Collection Time: 01/22/22  6:36 AM  Result Value Ref Range   POCT Fern Test     Positive    Comprehensive metabolic panel   Collection Time: 01/22/22  6:52 AM  Result Value Ref Range   Sodium 137 135 - 145 mmol/L   Potassium 4.0 3.5 - 5.1 mmol/L   Chloride 108 98 - 111 mmol/L   CO2 21 (L) 22 - 32 mmol/L   Glucose, Bld 115 (H) 70 - 99 mg/dL   BUN 9 6 - 20 mg/dL   Creatinine, Ser 0.82 0.44 - 1.00 mg/dL   Calcium 9.0 8.9 - 10.3 mg/dL   Total Protein 5.9 (L) 6.5 - 8.1 g/dL   Albumin 2.6 (L) 3.5 - 5.0 g/dL   AST 16 15 - 41 U/L   ALT 20 0 - 44 U/L   Alkaline Phosphatase 129 (H) 38 - 126 U/L   Total Bilirubin 0.2 (L) 0.3 - 1.2 mg/dL   GFR, Estimated >60 >60 mL/min   Anion gap 8 5 - 15  CBC   Collection Time: 01/22/22  6:52 AM  Result Value Ref Range   WBC 5.8 4.0 - 10.5 K/uL   RBC 4.14 3.87 - 5.11 MIL/uL   Hemoglobin 11.1 (L) 12.0 - 15.0 g/dL   HCT 33.7 (L) 36.0 - 46.0 %   MCV 81.4 80.0 - 100.0 fL   MCH 26.8 26.0 - 34.0 pg   MCHC 32.9 30.0 - 36.0 g/dL   RDW 13.2 11.5 - 15.5 %   Platelets 210 150 - 400 K/uL   nRBC 0.0 0.0 - 0.2 %  Type and screen   Collection Time: 01/22/22  6:52 AM  Result Value Ref Range   ABO/RH(D) O  POS    Antibody Screen NEG    Sample Expiration      01/25/2022,2359 Performed at [facility], [address] [place], [place] [address]   Glucose, capillary   Collection Time: 01/22/22  8:15 AM  Result Value Ref Range   Glucose-Capillary 99 70 - 99 mg/dL    Patient Active Problem List   Diagnosis Date Noted   Preeclampsia, third trimester 01/05/2022   Macrosomia affecting management of mother 01/01/2022   Diabetes mellitus in pregnancy 08/21/2021   Alpha thalassemia silent carrier 08/18/2021   Supervision of high risk pregnancy, antepartum 07/11/2021   Maternal morbid obesity, antepartum ([facility]) 08/05/2020   History of fetal demise, not currently pregnant 07/30/2020    Assessment/Plan:  Alicia Terry is a 31 y.o. G3P0110 at 29w6dhere for for SROM and pre-eclampsia with severe features.   #Labor: SROM 0400. SVE 2/60/-3, difficult check. Will do cytotec buccal 50 mcg x1, then reassess for FB/pitocin. #Pain: PRN, epidural when desires #FWB: Cat I #ID:  GBS unknown- PCN as patient is preterm #MOF: breast #MOC: undecided, discussed today #Circ:  desires  #Pre-eclampsia with severe features- based on severe range blood pressures, Pre-E labs normal, admission UPCR pending, previously 949 mg/g. Asymptomatic, on Magnesium with 4g bolus given, continue 2 gm/hr per protocol. Most recent BP mild range, has received IV labetalol 278mat 0720 am and IV labetalol 40 mg at 0750 AM, continue to monitor BP and use IV antihypertensives per protocol.  #Early onset Gestational Diabetes- failed 2 hr GTT at 14 WGA and A1c 5.7. LGA infant EFW > 99th%ile with polyhydramnios 25.5. q4 hr POC BG checks while in latent labor.  MaLenoria ChimeMD  Center for [employer], [facility] 01/22/2022, 10:25 AM

## 2022-01-23 ENCOUNTER — Encounter (HOSPITAL_COMMUNITY): Payer: Self-pay | Admitting: Obstetrics & Gynecology

## 2022-01-23 ENCOUNTER — Inpatient Hospital Stay (HOSPITAL_COMMUNITY): Payer: Medicaid Other | Admitting: Anesthesiology

## 2022-01-23 ENCOUNTER — Encounter (HOSPITAL_COMMUNITY)
Admission: AD | Discharge status: Against Medical Advice | Payer: Self-pay | Source: Home / Self Care | Attending: Obstetrics & Gynecology

## 2022-01-23 DIAGNOSIS — Z3A00 Weeks of gestation of pregnancy not specified: Secondary | ICD-10-CM | POA: Diagnosis not present

## 2022-01-23 DIAGNOSIS — Z3A37 37 weeks gestation of pregnancy: Secondary | ICD-10-CM

## 2022-01-23 DIAGNOSIS — O24425 Gestational diabetes mellitus in childbirth, controlled by oral hypoglycemic drugs: Secondary | ICD-10-CM

## 2022-01-23 DIAGNOSIS — O99214 Obesity complicating childbirth: Secondary | ICD-10-CM

## 2022-01-23 DIAGNOSIS — O403XX1 Polyhydramnios, third trimester, fetus 1: Secondary | ICD-10-CM | POA: Diagnosis not present

## 2022-01-23 DIAGNOSIS — Z98891 History of uterine scar from previous surgery: Secondary | ICD-10-CM

## 2022-01-23 DIAGNOSIS — O24429 Gestational diabetes mellitus in childbirth, unspecified control: Secondary | ICD-10-CM | POA: Diagnosis not present

## 2022-01-23 DIAGNOSIS — O1414 Severe pre-eclampsia complicating childbirth: Secondary | ICD-10-CM | POA: Diagnosis not present

## 2022-01-23 DIAGNOSIS — O1494 Unspecified pre-eclampsia, complicating childbirth: Secondary | ICD-10-CM | POA: Diagnosis not present

## 2022-01-23 LAB — COMPREHENSIVE METABOLIC PANEL
ALT: 18 U/L (ref 0–44)
AST: 18 U/L (ref 15–41)
Albumin: 2.6 g/dL — ABNORMAL LOW (ref 3.5–5.0)
Alkaline Phosphatase: 128 U/L — ABNORMAL HIGH (ref 38–126)
Anion gap: 10 (ref 5–15)
BUN: <5 mg/dL — ABNORMAL LOW (ref 6–20)
CO2: 21 mmol/L — ABNORMAL LOW (ref 22–32)
Calcium: 8.1 mg/dL — ABNORMAL LOW (ref 8.9–10.3)
Chloride: 104 mmol/L (ref 98–111)
Creatinine, Ser: 0.95 mg/dL (ref 0.44–1.00)
GFR, Estimated: >60 mL/min (ref >60)
Glucose, Bld: 78 mg/dL (ref 70–99)
Potassium: 4.4 mmol/L (ref 3.5–5.1)
Sodium: 135 mmol/L (ref 135–145)
Total Bilirubin: 0.4 mg/dL (ref 0.3–1.2)
Total Protein: 5.6 g/dL — ABNORMAL LOW (ref 6.5–8.1)

## 2022-01-23 LAB — CBC WITH DIFFERENTIAL/PLATELET
Abs Immature Granulocytes: 0.04 10*3/uL (ref 0.00–0.07)
Basophils Absolute: 0 10*3/uL (ref 0.0–0.1)
Basophils Relative: 0 %
Eosinophils Absolute: 0.1 10*3/uL (ref 0.0–0.5)
Eosinophils Relative: 1 %
HCT: 33.3 % — ABNORMAL LOW (ref 36.0–46.0)
Hemoglobin: 11 g/dL — ABNORMAL LOW (ref 12.0–15.0)
Immature Granulocytes: 1 %
Lymphocytes Relative: 18 %
Lymphs Abs: 1.3 10*3/uL (ref 0.7–4.0)
MCH: 26.7 pg (ref 26.0–34.0)
MCHC: 33 g/dL (ref 30.0–36.0)
MCV: 80.8 fL (ref 80.0–100.0)
Monocytes Absolute: 0.7 10*3/uL (ref 0.1–1.0)
Monocytes Relative: 10 %
Neutro Abs: 5 10*3/uL (ref 1.7–7.7)
Neutrophils Relative %: 70 %
Platelets: 200 10*3/uL (ref 150–400)
RBC: 4.12 MIL/uL (ref 3.87–5.11)
RDW: 13.4 % (ref 11.5–15.5)
WBC: 7 10*3/uL (ref 4.0–10.5)
nRBC: 0 % (ref 0.0–0.2)

## 2022-01-23 LAB — GLUCOSE, CAPILLARY
Glucose-Capillary: 80 mg/dL (ref 70–99)
Glucose-Capillary: 85 mg/dL (ref 70–99)
Glucose-Capillary: 75 mg/dL (ref 70–99)
Glucose-Capillary: 73 mg/dL (ref 70–99)

## 2022-01-23 LAB — CBC
HCT: 32.4 % — ABNORMAL LOW (ref 36.0–46.0)
Hemoglobin: 10.2 g/dL — ABNORMAL LOW (ref 12.0–15.0)
MCH: 26.2 pg (ref 26.0–34.0)
MCHC: 31.5 g/dL (ref 30.0–36.0)
MCV: 83.3 fL (ref 80.0–100.0)
Platelets: 187 10*3/uL (ref 150–400)
RBC: 3.89 MIL/uL (ref 3.87–5.11)
RDW: 13.4 % (ref 11.5–15.5)
WBC: 8.5 10*3/uL (ref 4.0–10.5)
nRBC: 0 % (ref 0.0–0.2)

## 2022-01-23 LAB — MAGNESIUM: Magnesium: 4.8 mg/dL — ABNORMAL HIGH (ref 1.7–2.4)

## 2022-01-23 SURGERY — Surgical Case
Anesthesia: Epidural

## 2022-01-23 MED ORDER — LIDOCAINE HCL (PF) 1 % IJ SOLN
INTRAMUSCULAR | Status: DC | PRN
  Administered 2022-01-23 (×2): 4 mL via EPIDURAL

## 2022-01-23 MED ORDER — SCOPOLAMINE 1 MG/3DAYS TD PT72
1.0000 | MEDICATED_PATCH | Freq: Once | TRANSDERMAL | Status: DC
  Administered 2022-01-23: 1.5 mg via TRANSDERMAL

## 2022-01-23 MED ORDER — ZOLPIDEM TARTRATE 5 MG PO TABS: 5.0000 mg | ORAL_TABLET | Freq: Every evening | ORAL | Status: DC | PRN

## 2022-01-23 MED ORDER — WITCH HAZEL-GLYCERIN EX PADS: 1.0000 | MEDICATED_PAD | CUTANEOUS | Status: DC | PRN

## 2022-01-23 MED ORDER — DEXAMETHASONE SODIUM PHOSPHATE 10 MG/ML IJ SOLN
INTRAMUSCULAR | Status: DC | PRN
  Administered 2022-01-23: 4 mg via INTRAVENOUS

## 2022-01-23 MED ORDER — OXYCODONE HCL 5 MG PO TABS: 5.0000 mg | ORAL_TABLET | ORAL | Status: DC | PRN

## 2022-01-23 MED ORDER — ACETAMINOPHEN 10 MG/ML IV SOLN
INTRAVENOUS | Status: DC | PRN
  Administered 2022-01-23: 1000 mg via INTRAVENOUS

## 2022-01-23 MED ORDER — KETOROLAC TROMETHAMINE 30 MG/ML IJ SOLN
30.0000 mg | Freq: Four times a day (QID) | INTRAMUSCULAR | Status: AC
  Administered 2022-01-23 – 2022-01-24 (×4): 30 mg via INTRAVENOUS
  Filled 2022-01-23 (×4): qty 1

## 2022-01-23 MED ORDER — LIDOCAINE-EPINEPHRINE (PF) 2 %-1:200000 IJ SOLN
INTRAMUSCULAR | Status: DC | PRN
  Administered 2022-01-23 (×2): 3 mL via EPIDURAL
  Administered 2022-01-23: 10 mL via EPIDURAL

## 2022-01-23 MED ORDER — KETOROLAC TROMETHAMINE 30 MG/ML IJ SOLN
INTRAMUSCULAR | Status: AC
  Filled 2022-01-23: qty 1

## 2022-01-23 MED ORDER — TRANEXAMIC ACID-NACL 1000-0.7 MG/100ML-% IV SOLN
INTRAVENOUS | Status: AC
  Filled 2022-01-23: qty 100

## 2022-01-23 MED ORDER — KETOROLAC TROMETHAMINE 30 MG/ML IJ SOLN
30.0000 mg | Freq: Four times a day (QID) | INTRAMUSCULAR | Status: DC | PRN
  Administered 2022-01-23: 30 mg via INTRAVENOUS

## 2022-01-23 MED ORDER — SCOPOLAMINE 1 MG/3DAYS TD PT72
MEDICATED_PATCH | TRANSDERMAL | Status: AC
  Filled 2022-01-23: qty 1

## 2022-01-23 MED ORDER — FENTANYL CITRATE (PF) 100 MCG/2ML IJ SOLN
INTRAMUSCULAR | Status: AC
  Filled 2022-01-23: qty 2

## 2022-01-23 MED ORDER — DIPHENHYDRAMINE HCL 25 MG PO CAPS: 25.0000 mg | ORAL_CAPSULE | Freq: Four times a day (QID) | ORAL | Status: DC | PRN

## 2022-01-23 MED ORDER — BENZOCAINE-MENTHOL 20-0.5 % EX AERO: 1.0000 | INHALATION_SPRAY | CUTANEOUS | Status: DC | PRN

## 2022-01-23 MED ORDER — ONDANSETRON HCL 4 MG/2ML IJ SOLN
INTRAMUSCULAR | Status: DC | PRN
  Administered 2022-01-23: 4 mg via INTRAVENOUS

## 2022-01-23 MED ORDER — MENTHOL 3 MG MT LOZG: 1.0000 | LOZENGE | OROMUCOSAL | Status: DC | PRN

## 2022-01-23 MED ORDER — LACTATED RINGERS IV SOLN: INTRAVENOUS | Status: DC

## 2022-01-23 MED ORDER — DIBUCAINE (PERIANAL) 1 % EX OINT: 1.0000 | TOPICAL_OINTMENT | CUTANEOUS | Status: DC | PRN

## 2022-01-23 MED ORDER — SODIUM CHLORIDE 0.9% FLUSH: 3.0000 mL | INTRAVENOUS | Status: DC | PRN

## 2022-01-23 MED ORDER — NALOXONE HCL 4 MG/10ML IJ SOLN: 1.0000 ug/kg/h | INTRAVENOUS | Status: DC | PRN

## 2022-01-23 MED ORDER — SODIUM BICARBONATE 8.4 % IV SOLN
INTRAVENOUS | Status: AC
  Filled 2022-01-23: qty 50

## 2022-01-23 MED ORDER — SODIUM CHLORIDE 0.9 % IR SOLN
Status: DC | PRN
  Administered 2022-01-23: 1

## 2022-01-23 MED ORDER — PRENATAL MULTIVITAMIN CH: 1.0000 | ORAL_TABLET | Freq: Every day | ORAL | Status: DC

## 2022-01-23 MED ORDER — COCONUT OIL OIL: 1.0000 | TOPICAL_OIL | Status: DC | PRN

## 2022-01-23 MED ORDER — MORPHINE SULFATE (PF) 0.5 MG/ML IJ SOLN
INTRAMUSCULAR | Status: DC | PRN
  Administered 2022-01-23: 2 mg via INTRAVENOUS

## 2022-01-23 MED ORDER — SIMETHICONE 80 MG PO CHEW: 80.0000 mg | CHEWABLE_TABLET | ORAL | Status: DC | PRN

## 2022-01-23 MED ORDER — PHENYLEPHRINE 80 MCG/ML (10ML) SYRINGE FOR IV PUSH (FOR BLOOD PRESSURE SUPPORT)
80.0000 ug | PREFILLED_SYRINGE | INTRAVENOUS | Status: DC | PRN
  Filled 2022-01-23: qty 10

## 2022-01-23 MED ORDER — DIPHENHYDRAMINE HCL 50 MG/ML IJ SOLN: 12.5000 mg | INTRAMUSCULAR | Status: DC | PRN

## 2022-01-23 MED ORDER — OXYTOCIN-SODIUM CHLORIDE 30-0.9 UT/500ML-% IV SOLN: 1.0000 m[IU]/min | INTRAVENOUS | Status: DC

## 2022-01-23 MED ORDER — SENNOSIDES-DOCUSATE SODIUM 8.6-50 MG PO TABS
2.0000 | ORAL_TABLET | ORAL | Status: DC
  Administered 2022-01-24: 2 via ORAL
  Filled 2022-01-23: qty 2

## 2022-01-23 MED ORDER — SENNOSIDES-DOCUSATE SODIUM 8.6-50 MG PO TABS: 2.0000 | ORAL_TABLET | Freq: Every day | ORAL | Status: DC

## 2022-01-23 MED ORDER — ONDANSETRON HCL 4 MG/2ML IJ SOLN
INTRAMUSCULAR | Status: AC
  Filled 2022-01-23: qty 2

## 2022-01-23 MED ORDER — SODIUM CHLORIDE 0.9 % IV SOLN
INTRAVENOUS | Status: AC
  Filled 2022-01-23: qty 5

## 2022-01-23 MED ORDER — EPHEDRINE 5 MG/ML INJ: 10.0000 mg | INTRAVENOUS | Status: DC | PRN

## 2022-01-23 MED ORDER — ONDANSETRON HCL 4 MG/2ML IJ SOLN: 4.0000 mg | INTRAMUSCULAR | Status: DC | PRN

## 2022-01-23 MED ORDER — OXYTOCIN-SODIUM CHLORIDE 30-0.9 UT/500ML-% IV SOLN
INTRAVENOUS | Status: DC | PRN
  Administered 2022-01-23: 300 mL via INTRAVENOUS
  Administered 2022-01-23: 200 mL via INTRAVENOUS

## 2022-01-23 MED ORDER — NALOXONE HCL 0.4 MG/ML IJ SOLN: 0.4000 mg | INTRAMUSCULAR | Status: DC | PRN

## 2022-01-23 MED ORDER — TETANUS-DIPHTH-ACELL PERTUSSIS 5-2.5-18.5 LF-MCG/0.5 IM SUSY: 0.5000 mL | PREFILLED_SYRINGE | Freq: Once | INTRAMUSCULAR | Status: DC

## 2022-01-23 MED ORDER — ACETAMINOPHEN 325 MG PO TABS: 650.0000 mg | ORAL_TABLET | ORAL | Status: DC | PRN

## 2022-01-23 MED ORDER — DEXTROSE 5 % IV SOLN
INTRAVENOUS | Status: DC | PRN
  Administered 2022-01-23: 3 g via INTRAVENOUS

## 2022-01-23 MED ORDER — CEFAZOLIN SODIUM-DEXTROSE 1-4 GM/50ML-% IV SOLN: 1.0000 g | Freq: Three times a day (TID) | INTRAVENOUS | Status: DC

## 2022-01-23 MED ORDER — LIDOCAINE-EPINEPHRINE (PF) 2 %-1:200000 IJ SOLN
INTRAMUSCULAR | Status: AC
  Filled 2022-01-23: qty 20

## 2022-01-23 MED ORDER — MAGNESIUM HYDROXIDE 400 MG/5ML PO SUSP: 30.0000 mL | ORAL | Status: DC | PRN

## 2022-01-23 MED ORDER — OXYCODONE-ACETAMINOPHEN 5-325 MG PO TABS: 2.0000 | ORAL_TABLET | ORAL | Status: DC | PRN

## 2022-01-23 MED ORDER — GABAPENTIN 300 MG PO CAPS
300.0000 mg | ORAL_CAPSULE | Freq: Two times a day (BID) | ORAL | Status: DC
  Administered 2022-01-23 – 2022-01-25 (×4): 300 mg via ORAL
  Filled 2022-01-23 (×4): qty 1

## 2022-01-23 MED ORDER — FENTANYL CITRATE (PF) 100 MCG/2ML IJ SOLN
INTRAMUSCULAR | Status: DC | PRN
  Administered 2022-01-23 (×2): 25 ug via INTRAVENOUS

## 2022-01-23 MED ORDER — MEPERIDINE HCL 25 MG/ML IJ SOLN: 6.2500 mg | INTRAMUSCULAR | Status: DC | PRN

## 2022-01-23 MED ORDER — SODIUM CHLORIDE 0.9 % IV SOLN
500.0000 mg | Freq: Once | INTRAVENOUS | Status: AC
  Administered 2022-01-23: 500 mg via INTRAVENOUS
  Filled 2022-01-23: qty 5

## 2022-01-23 MED ORDER — CEFAZOLIN SODIUM-DEXTROSE 2-4 GM/100ML-% IV SOLN: 2.0000 g | Freq: Once | INTRAVENOUS | Status: DC

## 2022-01-23 MED ORDER — SODIUM CHLORIDE 0.9 % IV SOLN: 500.0000 mg | INTRAVENOUS | Status: DC

## 2022-01-23 MED ORDER — OXYTOCIN-SODIUM CHLORIDE 30-0.9 UT/500ML-% IV SOLN: 2.5000 [IU]/h | INTRAVENOUS | Status: AC

## 2022-01-23 MED ORDER — FENTANYL CITRATE (PF) 100 MCG/2ML IJ SOLN
INTRAMUSCULAR | Status: DC | PRN
  Administered 2022-01-23: 100 ug via EPIDURAL

## 2022-01-23 MED ORDER — ACETAMINOPHEN 500 MG PO TABS
1000.0000 mg | ORAL_TABLET | Freq: Four times a day (QID) | ORAL | Status: DC
  Administered 2022-01-23 – 2022-01-25 (×5): 1000 mg via ORAL
  Filled 2022-01-23 (×6): qty 2

## 2022-01-23 MED ORDER — IBUPROFEN 600 MG PO TABS: 600.0000 mg | ORAL_TABLET | Freq: Four times a day (QID) | ORAL | Status: DC

## 2022-01-23 MED ORDER — TRANEXAMIC ACID-NACL 1000-0.7 MG/100ML-% IV SOLN: 1000.0000 mg | INTRAVENOUS | Status: DC

## 2022-01-23 MED ORDER — DEXMEDETOMIDINE HCL IN NACL 200 MCG/50ML IV SOLN
INTRAVENOUS | Status: DC | PRN
  Administered 2022-01-23: 20 ug via INTRAVENOUS

## 2022-01-23 MED ORDER — MORPHINE SULFATE (PF) 0.5 MG/ML IJ SOLN
INTRAMUSCULAR | Status: DC | PRN
  Administered 2022-01-23: 3 mg via EPIDURAL

## 2022-01-23 MED ORDER — TRANEXAMIC ACID-NACL 1000-0.7 MG/100ML-% IV SOLN
INTRAVENOUS | Status: DC | PRN
  Administered 2022-01-23: 1000 mg via INTRAVENOUS

## 2022-01-23 MED ORDER — ONDANSETRON HCL 4 MG PO TABS: 4.0000 mg | ORAL_TABLET | ORAL | Status: DC | PRN

## 2022-01-23 MED ORDER — ONDANSETRON HCL 4 MG/2ML IJ SOLN: 4.0000 mg | Freq: Three times a day (TID) | INTRAMUSCULAR | Status: DC | PRN

## 2022-01-23 MED ORDER — ENOXAPARIN SODIUM 40 MG/0.4ML IJ SOSY: 40.0000 mg | PREFILLED_SYRINGE | INTRAMUSCULAR | Status: DC

## 2022-01-23 MED ORDER — STERILE WATER FOR IRRIGATION IR SOLN
Status: DC | PRN
  Administered 2022-01-23: 1

## 2022-01-23 MED ORDER — LACTATED RINGERS IV SOLN: 500.0000 mL | Freq: Once | INTRAVENOUS | Status: DC

## 2022-01-23 MED ORDER — ACETAMINOPHEN 500 MG PO TABS: 1000.0000 mg | ORAL_TABLET | Freq: Four times a day (QID) | ORAL | Status: DC

## 2022-01-23 MED ORDER — PHENYLEPHRINE 80 MCG/ML (10ML) SYRINGE FOR IV PUSH (FOR BLOOD PRESSURE SUPPORT): 80.0000 ug | PREFILLED_SYRINGE | INTRAVENOUS | Status: DC | PRN

## 2022-01-23 MED ORDER — FERROUS SULFATE 325 (65 FE) MG PO TABS
325.0000 mg | ORAL_TABLET | ORAL | Status: DC
  Administered 2022-01-24: 325 mg via ORAL
  Filled 2022-01-23: qty 1

## 2022-01-23 MED ORDER — HYDROMORPHONE HCL 1 MG/ML IJ SOLN: 1.0000 mg | INTRAMUSCULAR | Status: DC | PRN

## 2022-01-23 MED ORDER — IBUPROFEN 600 MG PO TABS
600.0000 mg | ORAL_TABLET | Freq: Four times a day (QID) | ORAL | Status: DC
  Administered 2022-01-24 – 2022-01-25 (×3): 600 mg via ORAL
  Filled 2022-01-23 (×3): qty 1

## 2022-01-23 MED ORDER — DIPHENHYDRAMINE HCL 25 MG PO CAPS: 25.0000 mg | ORAL_CAPSULE | ORAL | Status: DC | PRN

## 2022-01-23 MED ORDER — KETOROLAC TROMETHAMINE 30 MG/ML IJ SOLN: 30.0000 mg | Freq: Four times a day (QID) | INTRAMUSCULAR | Status: DC | PRN

## 2022-01-23 MED ORDER — MAGNESIUM SULFATE 40 GM/1000ML IV SOLN
2.0000 g/h | INTRAVENOUS | Status: AC
  Administered 2022-01-23: 2 g/h via INTRAVENOUS
  Filled 2022-01-23: qty 1000

## 2022-01-23 MED ORDER — PRENATAL MULTIVITAMIN CH
1.0000 | ORAL_TABLET | Freq: Every day | ORAL | Status: DC
  Administered 2022-01-24: 1 via ORAL
  Filled 2022-01-23: qty 1

## 2022-01-23 MED ORDER — FENTANYL-BUPIVACAINE-NACL 0.5-0.125-0.9 MG/250ML-% EP SOLN
12.0000 mL/h | EPIDURAL | Status: DC | PRN
  Administered 2022-01-23: 12 mL/h via EPIDURAL
  Filled 2022-01-23: qty 250

## 2022-01-23 MED ORDER — MORPHINE SULFATE (PF) 0.5 MG/ML IJ SOLN
INTRAMUSCULAR | Status: AC
  Filled 2022-01-23: qty 10

## 2022-01-23 MED ORDER — MEASLES, MUMPS & RUBELLA VAC IJ SOLR: 0.5000 mL | Freq: Once | INTRAMUSCULAR | Status: DC

## 2022-01-23 SURGICAL SUPPLY — 38 items
BENZOIN TINCTURE PRP APPL 2/3 (GAUZE/BANDAGES/DRESSINGS) IMPLANT
CANISTER PREVENA PLUS 150 (CANNISTER) IMPLANT
CHLORAPREP W/TINT 26 (MISCELLANEOUS) ×2 IMPLANT
CLAMP UMBILICAL CORD (MISCELLANEOUS) ×1 IMPLANT
CLIP FILSHIE TUBAL LIGA STRL (Clip) IMPLANT
CLOTH BEACON ORANGE TIMEOUT ST (SAFETY) ×1 IMPLANT
DRESSING PREVENA PLUS CUSTOM (GAUZE/BANDAGES/DRESSINGS) IMPLANT
DRSG OPSITE POSTOP 4X10 (GAUZE/BANDAGES/DRESSINGS) ×1 IMPLANT
DRSG PREVENA PLUS CUSTOM (GAUZE/BANDAGES/DRESSINGS) ×2
ELECT REM PT RETURN 9FT ADLT (ELECTROSURGICAL) ×1
ELECTRODE REM PT RTRN 9FT ADLT (ELECTROSURGICAL) ×1 IMPLANT
EXTRACTOR VACUUM M CUP 4 TUBE (SUCTIONS) IMPLANT
GAUZE SPONGE 4X4 12PLY STRL LF (GAUZE/BANDAGES/DRESSINGS) IMPLANT
GLOVE BIOGEL PI IND STRL 7.0 (GLOVE) ×3 IMPLANT
GLOVE ECLIPSE 7.0 STRL STRAW (GLOVE) ×1 IMPLANT
GOWN STRL REUS W/TWL LRG LVL3 (GOWN DISPOSABLE) ×2 IMPLANT
KIT ABG SYR 3ML LUER SLIP (SYRINGE) IMPLANT
MAT PREVALON FULL STRYKER (MISCELLANEOUS) IMPLANT
NDL HYPO 25X5/8 SAFETYGLIDE (NEEDLE) ×1 IMPLANT
NEEDLE HYPO 22GX1.5 SAFETY (NEEDLE) ×1 IMPLANT
NEEDLE HYPO 25X5/8 SAFETYGLIDE (NEEDLE) ×1 IMPLANT
NS IRRIG 1000ML POUR BTL (IV SOLUTION) ×1 IMPLANT
PACK C SECTION WH (CUSTOM PROCEDURE TRAY) ×1 IMPLANT
PAD ABD 7.5X8 STRL (GAUZE/BANDAGES/DRESSINGS) ×1 IMPLANT
PAD OB MATERNITY 4.3X12.25 (PERSONAL CARE ITEMS) ×1 IMPLANT
RETRACTOR TRAXI PANNICULUS (MISCELLANEOUS) IMPLANT
RTRCTR C-SECT PINK 25CM LRG (MISCELLANEOUS) IMPLANT
STRIP CLOSURE SKIN 1/2X4 (GAUZE/BANDAGES/DRESSINGS) IMPLANT
SUT PDS AB 0 CTX 36 PDP370T (SUTURE) IMPLANT
SUT PDS AB 0 CTX 60 (SUTURE) IMPLANT
SUT PLAIN 2 0 XLH (SUTURE) IMPLANT
SUT VIC AB 0 CTX 36 (SUTURE) ×2
SUT VIC AB 0 CTX36XBRD ANBCTRL (SUTURE) ×2 IMPLANT
SUT VIC AB 4-0 KS 27 (SUTURE) ×1 IMPLANT
SYR CONTROL 10ML LL (SYRINGE) ×1 IMPLANT
TOWEL OR 17X24 6PK STRL BLUE (TOWEL DISPOSABLE) ×1 IMPLANT
TRAY FOLEY W/BAG SLVR 14FR LF (SET/KITS/TRAYS/PACK) ×1 IMPLANT
WATER STERILE IRR 1000ML POUR (IV SOLUTION) ×1 IMPLANT

## 2022-01-23 NOTE — Lactation Note (Signed)
This note was copied from a baby's chart. Lactation Consultation Note  Patient Name: Alicia Terry Date: 01/23/2022   Age:33 hours Attempted to see mom. Mom stated baby is due to feed again at MN. Asked mom to call for [facility] for next feeding . Maternal Data    Feeding Nipple Type: Extra Slow Flow  LATCH Score                    Lactation Tools Discussed/Used    Interventions    Discharge    Consult Status      Theodoro Kalata 01/23/2022, 10:23 PM

## 2022-01-23 NOTE — Progress Notes (Signed)
Patient requesting a cesarean section. Notified Dr. Elgie Congo of patients decision. Patient declined starting pitocin. Dr. Elgie Congo verbalized he will come to the bedside to chat with patient.   Jeanann Lewandowsky, RN

## 2022-01-23 NOTE — Anesthesia Preprocedure Evaluation (Signed)
Anesthesia Evaluation  Patient identified by MRN, date of birth, ID band Patient awake    Reviewed: Allergy & Precautions, Patient's Chart, lab work & pertinent test results  History of Anesthesia Complications Negative for: history of anesthetic complications  Airway Mallampati: II  TM Distance: >3 FB Neck ROM: Full    Dental no notable dental hx.    Pulmonary neg pulmonary ROS   Pulmonary exam normal        Cardiovascular Normal cardiovascular exam     Neuro/Psych negative neurological ROS  negative psych ROS   GI/Hepatic negative GI ROS, Neg liver ROS,,,  Endo/Other  diabetes, Gestational  Morbid obesity (BMI 48)  Renal/GU negative Renal ROS  negative genitourinary   Musculoskeletal negative musculoskeletal ROS (+)    Abdominal   Peds  Hematology  (+) Blood dyscrasia, anemia   Anesthesia Other Findings Day of surgery medications reviewed with patient.  Reproductive/Obstetrics (+) Pregnancy (severe preE on Mg)                              Anesthesia Physical Anesthesia Plan  ASA: 3  Anesthesia Plan: Epidural   Post-op Pain Management:    Induction:   PONV Risk Score and Plan: Treatment may vary due to age or medical condition  Airway Management Planned: Natural Airway  Additional Equipment: Fetal Monitoring  Intra-op Plan:   Post-operative Plan:   Informed Consent: I have reviewed the patients History and Physical, chart, labs and discussed the procedure including the risks, benefits and alternatives for the proposed anesthesia with the patient or authorized representative who has indicated his/her understanding and acceptance.       Plan Discussed with:   Anesthesia Plan Comments:          Anesthesia Quick Evaluation

## 2022-01-23 NOTE — Discharge Summary (Signed)
Postpartum Discharge Summary - Patient discharged against medical advice      Patient Name: Alicia Terry DOB: 07/28/1990 MRN: [id number]  Date of admission: 01/22/2022 Delivery date:01/23/2022  Delivering provider: Jaynie Collins A  Date of discharge: 01/25/2022  Admitting diagnosis: Preeclampsia, third trimester [O14.93] Indication for care in labor or delivery [O75.9] Intrauterine pregnancy: [redacted]w[redacted]d     Secondary diagnosis:  Principal Problem:   Severe pre-eclampsia, with delivery Active Problems:   S/P cesarean section   Acute blood loss as cause of postoperative anemia  Additional problems: Inadequate BP control, patient left against medical advice   Discharge diagnosis: Term Pregnancy Delivered, Preeclampsia (severe), GDM A2, and Anemia                                              Post partum procedures: None Augmentation: AROM and Pitocin Complications: None  Hospital course: Induction of Labor With Cesarean Section   31 y.o. yo G3P0110 at [redacted]w[redacted]d was admitted to the hospital 01/22/2022 for induction of labor for severe preeclampsia. Patient had a labor course significant for frequent fetal HR decels with attempted augmentation. The patient went for cesarean section due to Non-Reassuring FHR. Delivery details are as follows: Membrane Rupture Time/Date: 4:00 AM ,01/22/2022   Delivery Method:C-Section, Low Transverse  Details of operation can be found in separate operative Note.  Patient had a postpartum course complicated by high BP.  She received intrapartum and postpartum magnesium sulfate as per protocol for eclampsia prophylaxis. She was started on Procardia XL 30 mg and Lasix and was in the process of titrating for better control but patient decided she wanted to leave. Her SBPs were in 140-150s, it was encouraged that she stay for optimal BP control, she declined this and said she was leaving. She agreed to sign the AMA form, understands that [facility] and it providers/staff  are not liable for any adverse effects of uncontrolled BP such as eclampsia, stroke or other complications. She is ambulating, tolerating a regular diet, passing flatus, and urinating well.  Patient is discharged home in stable condition on 01/25/22.      Newborn Data: Birth date:01/23/2022  Birth time:12:07 PM  Gender:Female  Living status:Living  Apgars:7 ,9  Weight:4140 g                                Magnesium Sulfate received: Yes: Seizure prophylaxis BMZ received: No Rhophylac:No Transfusion:No  Physical exam  Vitals:   01/25/22 0815 01/25/22 1155 01/25/22 1354 01/25/22 1608  BP: (!) 140/80 135/86 (!) 143/76 (!) 153/87  Pulse: 93 93 (!) 114 (!) 108  Resp: 18 18  17   Temp: 98.1 F (36.7 C) 98 F (36.7 C)  98.6 F (37 C)  TempSrc: Oral Oral  Oral  SpO2: 99% 98%  99%  Weight:      Height:       General: alert, cooperative, and no distress Lochia: appropriate Uterine Fundus: firm Incision: Healing well with no significant drainage, Prevena in place DVT Evaluation: No evidence of DVT seen on physical exam. Negative Homan's sign. No cords or calf tenderness. Labs: Lab Results  Component Value Date   WBC 8.9 01/24/2022   HGB 8.8 (L) 01/24/2022   HCT 27.8 (L) 01/24/2022   MCV 83.0 01/24/2022   PLT 175 01/24/2022  Latest Ref Rng & Units 01/24/2022    3:40 AM  CMP  Glucose 70 - 99 mg/dL 161   BUN 6 - 20 mg/dL 9   Creatinine 0.96 - 0.45 mg/dL 4.09   Sodium 811 - 914 mmol/L 131   Potassium 3.5 - 5.1 mmol/L 4.5   Chloride 98 - 111 mmol/L 104   CO2 22 - 32 mmol/L 22   Calcium 8.9 - 10.3 mg/dL 7.6   Total Protein 6.5 - 8.1 g/dL 4.6   Total Bilirubin 0.3 - 1.2 mg/dL <7.8   Alkaline Phos 38 - 126 U/L 96   AST 15 - 41 U/L 18   ALT 0 - 44 U/L 13    Edinburgh Score:    01/24/2022   10:00 AM  Edinburgh Postnatal Depression Scale Screening Tool  I have been able to laugh and see the funny side of things. 0  I have looked forward with enjoyment to things. 0  I  have blamed myself unnecessarily when things went wrong. 0  I have been anxious or worried for no good reason. 0  I have felt scared or panicky for no good reason. 0  Things have been getting on top of me. 0  I have been so unhappy that I have had difficulty sleeping. 0  I have felt sad or miserable. 0  I have been so unhappy that I have been crying. 0  The thought of harming myself has occurred to me. 0  Edinburgh Postnatal Depression Scale Total 0     After visit meds:  Allergies as of 01/25/2022   No Known Allergies      Medication List     STOP taking these medications    Accu-Chek Guide Me w/Device Kit   Accu-Chek Guide test strip Generic drug: glucose blood   Accu-Chek Softclix Lancets lancets   metFORMIN 500 MG tablet Commonly known as: GLUCOPHAGE       TAKE these medications    aspirin EC 81 MG tablet Take 1 tablet (81 mg total) by mouth daily. Take after 12 weeks for prevention of preeclampsia later in pregnancy   furosemide 20 MG tablet Commonly known as: LASIX Take 1 tablet (20 mg total) by mouth daily for 5 days. Start taking on: January 26, 2022   ibuprofen 600 MG tablet Commonly known as: ADVIL Take 1 tablet (600 mg total) by mouth every 6 (six) hours as needed for mild pain, headache, moderate pain or cramping.   multivitamin-prenatal 27-0.8 MG Tabs tablet Take 1 tablet by mouth daily at 12 noon.   NIFEdipine 30 MG 24 hr tablet Commonly known as: PROCARDIA-XL/NIFEDICAL-XL Take 1 tablet (30 mg total) by mouth 2 (two) times daily. What changed: when to take this   oxyCODONE 5 MG immediate release tablet Commonly known as: Oxy IR/ROXICODONE Take 1 tablet (5 mg total) by mouth every 4 (four) hours as needed for severe pain or breakthrough pain.   senna-docusate 8.6-50 MG tablet Commonly known as: Senokot-S Take 2 tablets by mouth at bedtime as needed for mild constipation or moderate constipation.   VITAMIN D3 GUMMIES ADULT PO Take by  mouth.               Discharge Care Instructions  (From admission, onward)           Start     Ordered   01/25/22 0000  Discharge wound care:       Comments: As per discharge handout and nursing instructions  01/25/22 1629            Discharge home in guarded condition, against medical advice  Infant Feeding: Bottle and Breast Infant Disposition:home with mother Discharge instruction: per After Visit Summary and Postpartum booklet. Activity: Advance as tolerated. Pelvic rest for 6 weeks.  Diet: routine diet Future Appointments: Future Appointments  Date Time Provider Department Center  02/01/2022  8:30 AM [facility] NURSE [facility]  03/07/2022  8:50 AM [facility]  03/07/2022 11:15 AM Federico Flake, MD [facility]  05/25/2022  1:40 PM Meriam Sprague, MD [facility] None   Follow up Visit:  Message sent to [facility] by Autry-Lott on 01/25/2022  Please schedule this patient for a In person postpartum visit in 6 weeks with the following provider: MD and APP. Additional Postpartum F/U:2 hour GTT @ PP visit, Incision check/Prevena removal 1 week, and BP check 1 week  High risk pregnancy complicated by:  Hx of IUFD, PTD, preE, obesity, macrosomia Delivery mode:  C-Section, Low Transverse  Anticipated Birth Control:  POPs   01/25/2022 Jaynie Collins, MD

## 2022-01-23 NOTE — Op Note (Signed)
[name] PROCEDURE DATE: 01/23/2022  PREOPERATIVE DIAGNOSES: Intrauterine pregnancy at 59w0dweeks gestation;  fetal intolerance of labor; severe preeclampsia; oral hypoglycemic controlled gestational diabetes; morbid obesity  POSTOPERATIVE DIAGNOSES: The same  PROCEDURE: Low Transverse Cesarean Section  SURGEON:  Dr. UVerita Schneiders ASSISTANT:  Dr. SNaaman PlummerAutry-Lott. An experienced assistant was required given the standard of surgical care given the complexity of the case.  This assistant was needed for exposure, dissection, suctioning, retraction, instrument exchange, assisting with delivery with administration of fundal pressure, and for overall help during the procedure.  ANESTHESIOLOGY TEAM: Anesthesiologist: FPervis Hocking DO; HBrennan Bailey MD CRNA: BAsher Muir CRNA  INDICATIONS: TLISSETT FAVORITEis a 31y.o. G3P0110 at 33w0dere for cesarean section secondary to the indications listed under preoperative diagnoses; please see preoperative note for further details.  The risks of surgery were discussed with the patient including but were not limited to: bleeding which may require transfusion or reoperation; infection which may require antibiotics; injury to bowel, bladder, ureters or other surrounding organs; injury to the fetus; need for additional procedures including hysterectomy in the event of a life-threatening hemorrhage; formation of adhesions; placental abnormalities wth subsequent pregnancies; incisional problems; thromboembolic phenomenon and other postoperative/anesthesia complications.  The patient concurred with the proposed plan, giving informed written consent for the procedure.    FINDINGS:  Viable female infant in cephalic presentation. Nuchal cord x 1, tight shoulder cord x 1. Apgars pending at the time of note, weight 9 lb 2oz (4140 g). Clear amniotic fluid.  Intact placenta, three vessel cord.  Normal uterus, fallopian tubes and ovaries  bilaterally.  4 cm serosal fibroid in superior portion of anterior uterus noted.   ANESTHESIA: Epidural  ESTIMATED BLOOD LOSS: 929 ml SPECIMENS: Placenta sent to L&D COMPLICATIONS: None immediate  PROCEDURE IN DETAIL:  The patient preoperatively received intravenous antibiotics and had sequential compression devices applied to her lower extremities.  She was then taken to the operating room where the epidural anesthesia was dosed up to surgical level and was found to be adequate. She was then placed in a dorsal supine position with a leftward tilt, and prepped and draped in a sterile manner.  A foley catheter was placed into her bladder and attached to constant gravity.  After an adequate timeout was performed, a Pfannenstiel skin incision was made with scalpel and carried through to the underlying layer of fascia. The fascia was incised in the midline, and this incision was extended bilaterally in a blunt fashion.  The underlying rectus muscles were dissected off the fascia superiorly and inferiorly in a blunt fashion. The rectus muscles were separated in the midline and the peritoneum was entered bluntly. The Alexis self-retaining retractor was introduced into the abdominal cavity.  Attention was turned to the lower uterine segment where a low transverse hysterotomy was made with a scalpel and extended bilaterally bluntly.  The infant was successfully delivered, the cord was clamped and cut after one minute, and the infant was handed over to the awaiting neonatology team. Uterine massage was then administered, and the placenta delivered intact with a three-vessel cord. The uterus was then cleared of clots and debris.  The hysterotomy was closed with 0 Vicryl in a running locked fashion, and an imbricating layer was also placed with 0 Vicryl.  The pelvis was cleared of all clot and debris. Hemostasis was confirmed on all surfaces.  The retractor was removed.  The peritoneum was closed with a 0 Vicryl running  stitch. The fascia was then closed using 0 PDS in a running fashion.  The subcutaneous layer was irrigated, reapproximated with 2-0 plain gut interrupted stitches, and the skin was closed with a 4-0 Vicryl subcuticular stitch. After the skin was closed, a Prevena disposable negative pressure wound therapy device was placed over the incision.  The suction was activated at a pressure of -125 mmHg.  The adhesive was affixed well and there were no leaks noted. The patient tolerated the procedure well. Sponge, instrument and needle counts were correct x 3.  She was taken to the recovery room in stable condition.     Verita Schneiders, MD, [place] for [facility], [facility] 01/23/2022, 12:50 PM

## 2022-01-23 NOTE — Transfer of Care (Signed)
Immediate Anesthesia Transfer of Care Note  Patient: Alicia Terry  Procedure(s) Performed: CESAREAN SECTION  Patient Location: PACU  Anesthesia Type:Spinal  Level of Consciousness: awake  Airway & Oxygen Therapy: Patient Spontanous Breathing  Post-op Assessment: Report given to RN  Post vital signs: Reviewed and stable  Last Vitals:  Vitals Value Taken Time  BP    Temp    Pulse    Resp    SpO2      Last Pain:  Vitals:   01/23/22 1100  TempSrc: Oral  PainSc:          Complications: No notable events documented.

## 2022-01-23 NOTE — Progress Notes (Cosign Needed Addendum)
Labor Progress Note INARI SHIN is a 31 y.o. G3P0110 at 82w0dpresented for IOL due to pre-eclampsia with severe features S: attention drawn to patient while she had a late deceleration. Dr BElgie Congoat bedside with RN when I got into the room. Baby had recovered.   O:  BP 138/79   Pulse 89   Temp 97.6 F (36.4 C) (Oral)   Resp 16   Ht '5\' 8"'$  (1.727 m)   Wt (!) 142.7 kg   LMP 05/09/2021 (Exact Date)   SpO2 97%   BMI 47.82 kg/m   EFM: 135/moderate/+accels, late decel noted  CVE: Dilation: 4.5 Dilation Complete Date: 01/23/22 Dilation Complete Time: 0651 Effacement (%): 60 Station: Ballotable Presentation: Vertex Exam by:: Dr. BElgie Congo  A&P: 31y.o. G3P0110 345w0dOL for pre-eclampsia with SF.  #Labor: slow progress, still remote from delivery. Had recurrent decels and pitocin was stopped overnight, still off pitocin. Contractions are irregular, and about 7-10 mins apart. Patient appears comfortable. Plan to restart pitocin on 1X1 and see how baby tolerates. Dr BaElgie Congoxplained to patient and family the plan and consideration of a  C-section if baby doesn't tolerate pitocin augmentation. #Pain: Epidural in place #FWB: cat 2, due to late decels #GBS  unknown - PCN given  Pre-eclampsia with SF: on Mag; blood pressures have been well controlled.  Will get CBC, CMP and Mag levels.  Annaliza Zia J Sherrilyn RistMD 10:52 AM

## 2022-01-23 NOTE — Progress Notes (Signed)
Pt seen per request of patient.  Pt previously checked with cervix 4/60/-3.  She has had another spontaneous decel to the 70s-80s.  Pitocin was never started.  With cervix 4 cm and remote from delivery, the patient desires primary cesarean section.    The risks of surgery were discussed with the patient including but were not limited to: bleeding which may require transfusion or reoperation; infection which may require antibiotics; injury to bowel, bladder, ureters or other surrounding organs; injury to the fetus; need for additional procedures including hysterectomy in the event of a life-threatening hemorrhage; formation of adhesions; placental abnormalities wth subsequent pregnancies; incisional problems; thromboembolic phenomenon and other postoperative/anesthesia complications.  The patient concurred with the proposed plan, giving informed written consent for the procedure.   Patient has been NPO since admission she will remain NPO for procedure. Anesthesia and OR aware. Preoperative prophylactic antibiotics and SCDs ordered on call to the OR.  To OR when ready.  Pt will need wound vac and possibly the traxxi.   Lynnda Shields, MD

## 2022-01-23 NOTE — Anesthesia Procedure Notes (Signed)

## 2022-01-23 NOTE — Anesthesia Postprocedure Evaluation (Signed)
Anesthesia Post Note  Patient: Alicia Terry  Procedure(s) Performed: CESAREAN SECTION     Patient location during evaluation: PACU Anesthesia Type: Epidural Level of consciousness: awake and alert and oriented Pain management: pain level controlled Vital Signs Assessment: post-procedure vital signs reviewed and stable Respiratory status: spontaneous breathing, nonlabored ventilation and respiratory function stable Cardiovascular status: blood pressure returned to baseline and stable Postop Assessment: no headache, no backache, no apparent nausea or vomiting and epidural receding Anesthetic complications: no   No notable events documented.  Last Vitals:  Vitals:   01/23/22 1426 01/23/22 1537  BP: (!) 149/78 (!) 148/83  Pulse: 87 82  Resp: 18 17  Temp: (!) 36.3 C 36.8 C  SpO2: 95% 95%    Last Pain:  Vitals:   01/23/22 1537  TempSrc: Oral  PainSc:    Pain Goal:                   Pervis Hocking

## 2022-01-24 ENCOUNTER — Inpatient Hospital Stay (HOSPITAL_COMMUNITY)
Admission: RE | Admit: 2022-01-24 | Payer: Medicaid Other | Source: Home / Self Care | Admitting: Obstetrics and Gynecology

## 2022-01-24 ENCOUNTER — Inpatient Hospital Stay (HOSPITAL_COMMUNITY): Payer: Medicaid Other

## 2022-01-24 LAB — COMPREHENSIVE METABOLIC PANEL
ALT: 13 U/L (ref 0–44)
AST: 18 U/L (ref 15–41)
Albumin: 2 g/dL — ABNORMAL LOW (ref 3.5–5.0)
Alkaline Phosphatase: 96 U/L (ref 38–126)
Anion gap: 5 (ref 5–15)
BUN: 9 mg/dL (ref 6–20)
CO2: 22 mmol/L (ref 22–32)
Calcium: 7.6 mg/dL — ABNORMAL LOW (ref 8.9–10.3)
Chloride: 104 mmol/L (ref 98–111)
Creatinine, Ser: 1.01 mg/dL — ABNORMAL HIGH (ref 0.44–1.00)
GFR, Estimated: >60 mL/min (ref >60)
Glucose, Bld: 159 mg/dL — ABNORMAL HIGH (ref 70–99)
Potassium: 4.5 mmol/L (ref 3.5–5.1)
Sodium: 131 mmol/L — ABNORMAL LOW (ref 135–145)
Total Bilirubin: <0.1 mg/dL — ABNORMAL LOW (ref 0.3–1.2)
Total Protein: 4.6 g/dL — ABNORMAL LOW (ref 6.5–8.1)

## 2022-01-24 LAB — CBC
HCT: 27.8 % — ABNORMAL LOW (ref 36.0–46.0)
Hemoglobin: 8.8 g/dL — ABNORMAL LOW (ref 12.0–15.0)
MCH: 26.3 pg (ref 26.0–34.0)
MCHC: 31.7 g/dL (ref 30.0–36.0)
MCV: 83 fL (ref 80.0–100.0)
Platelets: 175 10*3/uL (ref 150–400)
RBC: 3.35 MIL/uL — ABNORMAL LOW (ref 3.87–5.11)
RDW: 13.4 % (ref 11.5–15.5)
WBC: 8.9 10*3/uL (ref 4.0–10.5)
nRBC: 0 % (ref 0.0–0.2)

## 2022-01-24 LAB — GLUCOSE, CAPILLARY: Glucose-Capillary: 166 mg/dL — ABNORMAL HIGH (ref 70–99)

## 2022-01-24 MED ORDER — SODIUM CHLORIDE 0.9 % IV SOLN
500.0000 mg | Freq: Once | INTRAVENOUS | Status: AC
  Administered 2022-01-24: 500 mg via INTRAVENOUS
  Filled 2022-01-24: qty 500

## 2022-01-24 MED ORDER — FUROSEMIDE 20 MG PO TABS
20.0000 mg | ORAL_TABLET | Freq: Every day | ORAL | Status: DC
  Administered 2022-01-24 – 2022-01-25 (×2): 20 mg via ORAL
  Filled 2022-01-24 (×2): qty 1

## 2022-01-24 MED ORDER — ENOXAPARIN SODIUM 80 MG/0.8ML IJ SOSY
70.0000 mg | PREFILLED_SYRINGE | INTRAMUSCULAR | Status: DC
  Administered 2022-01-24 – 2022-01-25 (×2): 70 mg via SUBCUTANEOUS
  Filled 2022-01-24 (×2): qty 0.8

## 2022-01-24 NOTE — Lactation Note (Addendum)
This note was copied from a baby's chart. Lactation Consultation Note  Patient Name: Alicia Terry YJEHU'D Date: 01/24/2022 Reason for consult: Initial assessment;Primapara;1st time breastfeeding;Early term 37-38.6wks;Breastfeeding assistance;Infant weight loss (0.7% WL) Age:33 hours  LC entered the room and the infant was in the bassinet being changed.  According to the birth parent, she tried to pump yesterday, but did not see any milk.  The pump was set up by the RN and the birth parent was taught how to use it.  She remarked that she is on Mag and she believes that she shouldn't pump until after she finishes the [place].  LC reassured the birth parent that it was normal to not see a lot milk after birth.  She commented that she has been feeding the infant formula while she waits for her milk volume to increase.  [place] educated the parents on supply and demand and the benefits of colostrum.  The birth parent stated that she would begin pumping every 3 hrs.  The supporting parent asked about using a manual pump.  LC showed the birth parent how to use the manual pump.  The birth parent stated that she would like to pump and put the infant to the breast.  The birth parent said that the infant recently ate and she would call the [facility] when she is ready to work on feeding.  LC was asked to come back later today to teach hand expression and assist with breastfeeding.  LC reviewed the outpatient services brochure with the parents & left her name on the board.   Infant Feeding Plan:  Breastfeed 8+ times in 24 hours according to feeding cues.  Hand express for stimulation and feed the expressed colostrum to the infant via a spoon.  Put the infant to the breast prior to supplementing.  Supplement according to supplementation guidelines.  Pump every 3 hours.  Call [facility] for assistance with breastfeeding.    Maternal Data Has patient been taught Hand Expression?: No Does the patient have  breastfeeding experience prior to this delivery?: No  Feeding Mother's Current Feeding Choice: Breast Milk and Formula  Interventions Interventions: Breast feeding basics reviewed;Education;LC Services brochure  Discharge Pump: DEBP;Hands Free;Personal  Consult Status Consult Status: Follow-up Date: 01/25/22 Follow-up type: In-patient    Alicia Terry 01/24/2022, 11:31 AM

## 2022-01-24 NOTE — Progress Notes (Signed)
Subjective: Postpartum Day 1: Cesarean Delivery Patient reports feeling well and is without complaints. Incisional pain is well controlled. She denies HA, visual changes, RIQ/epigastric pain, nausea or emesis. Patient ambulated in room and denies feeling dizzy or lightheaded.    Objective: Vital signs in last 24 hours: Temp:  [97.3 F (36.3 C)-98.6 F (37 C)] 97.9 F (36.6 C) (12/20 0402) Pulse Rate:  [76-94] 87 (12/20 0402) Resp:  [8-27] 19 (12/20 0600) BP: (129-155)/(70-95) 137/78 (12/20 0402) SpO2:  [94 %-98 %] 96 % (12/20 0402)  Physical Exam:  General: alert, cooperative, and no distress Lochia: appropriate Uterine Fundus: firm Incision: Prevena wound vac in place and intact DVT Evaluation: No evidence of DVT seen on physical exam.  Recent Labs    01/23/22 1412 01/24/22 0340  HGB 10.2* 8.8*  HCT 32.4* 27.8*    Assessment/Plan: Status post Cesarean section with severe preeclampsia - Continue magnesium sulfate for seizure prophylaxis until time of delivery (around 11 am this morning) - Will start lasix - Continue monitoring BP - Patient with anemia related to blood loss during surgery- will give iron infusion - Patient with GDMA2, CBG this am 166. Patient admits to overeating overnight with last meal around 10 pm. Will continue to monitor closely Continue current care.  Mora Bellman, MD 01/24/2022, 6:44 AM

## 2022-01-25 DIAGNOSIS — D62 Acute posthemorrhagic anemia: Secondary | ICD-10-CM | POA: Diagnosis present

## 2022-01-25 MED ORDER — FUROSEMIDE 20 MG PO TABS: 20.0000 mg | ORAL_TABLET | Freq: Every day | ORAL | 0 refills | Status: DC

## 2022-01-25 MED ORDER — IBUPROFEN 600 MG PO TABS: 600.0000 mg | ORAL_TABLET | Freq: Four times a day (QID) | ORAL | 1 refills | Status: DC | PRN

## 2022-01-25 MED ORDER — OXYCODONE HCL 5 MG PO TABS: 5.0000 mg | ORAL_TABLET | ORAL | 0 refills | Status: DC | PRN

## 2022-01-25 MED ORDER — SENNOSIDES-DOCUSATE SODIUM 8.6-50 MG PO TABS: 2.0000 | ORAL_TABLET | Freq: Every evening | ORAL | 2 refills | Status: DC | PRN

## 2022-01-25 MED ORDER — NIFEDIPINE ER OSMOTIC RELEASE 30 MG PO TB24
30.0000 mg | ORAL_TABLET | Freq: Once | ORAL | Status: AC
  Administered 2022-01-25: 30 mg via ORAL
  Filled 2022-01-25: qty 1

## 2022-01-25 MED ORDER — NIFEDIPINE ER OSMOTIC RELEASE 30 MG PO TB24
30.0000 mg | ORAL_TABLET | Freq: Every day | ORAL | Status: DC
  Administered 2022-01-25: 30 mg via ORAL
  Filled 2022-01-25: qty 1

## 2022-01-25 MED ORDER — NIFEDIPINE ER OSMOTIC RELEASE 30 MG PO TB24: 30.0000 mg | ORAL_TABLET | Freq: Two times a day (BID) | ORAL | 3 refills | Status: DC

## 2022-01-25 NOTE — Progress Notes (Addendum)
Postpartum Day 2: Cesarean Delivery Subjective: Patient denies any headaches, visual symptoms, RUQ/epigastric pain or other concerning symptoms. No flatus yet. Patient has no complaints this morning  Objective: Vital signs in last 24 hours:  Patient Vitals for the past 24 hrs:  BP Temp Temp src Pulse Resp SpO2  01/25/22 0815 (!) 140/80 98.1 F (36.7 C) Oral 93 18 99 %  01/25/22 0336 (!) 150/86 98.2 F (36.8 C) Oral 92 17 --  01/24/22 2346 130/62 98.2 F (36.8 C) Oral 99 18 98 %  01/24/22 1935 (!) 141/80 98.5 F (36.9 C) Oral (!) 104 18 98 %  01/24/22 1525 137/89 98.3 F (36.8 C) Oral (!) 108 17 97 %  01/24/22 1149 (!) 139/90 98 F (36.7 C) Oral (!) 103 17 99 %    Physical Exam:  General: alert Lochia: appropriate Uterine Fundus: firm Incision: healing well DVT Evaluation: No evidence of DVT seen on physical exam.  Recent Labs    01/23/22 1412 01/24/22 0340  HGB 10.2* 8.8*  HCT 32.4* 27.8*    Assessment/Plan: Status post Cesarean section. Doing well postoperatively.  Patient with anemia related to acute blood loss during surgery- received  iron infusion yesterday Started on Procardia XL 30 mg daily for BP control, continue Lasix as ordered Continue current care.  Chancy Milroy, MD and Verita Schneiders, MD 01/25/2022, 8:40 AM

## 2022-01-25 NOTE — Lactation Note (Signed)
This note was copied from a baby's chart. Lactation Consultation Note  Patient Name: Alicia Terry CRFVO'H Date: 01/25/2022 Reason for consult: Follow-up assessment;1st time breastfeeding;Early term 37-38.6wks Age:33 hours  P1, Reviewed hand expression with good flow. Attempted to latch but baby was recently fed. Discussed waking for feedings if needed. Discussed pumping q 3 hours and milk storage. Reviewed engorgement care and monitoring voids/stools.   Maternal Data Has patient been taught Hand Expression?: Yes Does the patient have breastfeeding experience prior to this delivery?: No  Feeding Mother's Current Feeding Choice: Breast Milk and Formula Nipple Type: Nfant Slow Flow (purple)  Lactation Tools Discussed/Used Tools: Pump Breast pump type: Double-Electric Breast Pump Pump Education: Milk Storage;Setup, frequency, and cleaning Reason for Pumping: stimulation and supplementation Pumping frequency: q 3 hours  Interventions Interventions: Breast feeding basics reviewed;Assisted with latch;Hand express;DEBP;Education  Discharge Discharge Education: Engorgement and breast care;Warning signs for feeding baby Pump: DEBP;Personal;Hands Free  Consult Status Consult Status: Complete Date: 01/25/22    Alicia Terry [facility] 01/25/2022, 2:29 PM

## 2022-01-25 NOTE — Progress Notes (Signed)
Attending Circumcision Counseling Progress Note  Patient desires circumcision for her female infant.  Circumcision procedure details discussed, risks and benefits of procedure were also discussed.  These include but are not limited to: Benefits of circumcision in men include reduction in the rates of urinary tract infection (UTI), penile cancer, some sexually transmitted infections, penile inflammatory and retractile disorders, as well as easier hygiene.  Risks include bleeding , infection, injury of glans which may lead to penile deformity or urinary tract issues, unsatisfactory cosmetic appearance and other potential complications related to the procedure.  It was emphasized that this is an elective procedure.  Patient wants to proceed with circumcision; written informed consent obtained.  Will do circumcision soon, routine circumcision and post circumcision care ordered for the infant.  Alicia Terry, M.D. 01/25/2022 8:41 AM

## 2022-01-25 NOTE — Plan of Care (Signed)
Pt to be discharged home with printed instructions. Cola Gane L Jarquavious Fentress, RN  

## 2022-01-26 ENCOUNTER — Ambulatory Visit: Payer: Medicaid Other

## 2022-01-31 ENCOUNTER — Encounter: Payer: Self-pay | Admitting: Obstetrics and Gynecology

## 2022-02-01 ENCOUNTER — Ambulatory Visit (INDEPENDENT_AMBULATORY_CARE_PROVIDER_SITE_OTHER): Payer: Medicaid Other | Admitting: *Deleted

## 2022-02-01 ENCOUNTER — Other Ambulatory Visit: Payer: Self-pay

## 2022-02-01 VITALS — BP 135/83 | HR 98 | Ht 68.0 in | Wt 279.9 lb

## 2022-02-01 DIAGNOSIS — Z0130 Encounter for examination of blood pressure without abnormal findings: Secondary | ICD-10-CM

## 2022-02-01 DIAGNOSIS — Z4889 Encounter for other specified surgical aftercare: Secondary | ICD-10-CM

## 2022-02-01 NOTE — Progress Notes (Signed)
Here for Incision check and BP check s/p C/S 01/23/22 at 37 weeks for severe preeclampsia, fetal intolerance of labor, GDM. States was not given Oxycodone from pharmacy and has been ok withhout it. Took Lasix 5 days as ordered. Still has 2+ edema  but is less than when discharged. Denies headaches or visual disturbances. Took Procardia today about 0630. BP today 135/83. Wound vac removed slowly, patient tolerated well. Incision remains CDI with scant pink edges. I asked Dr. Roselie Awkward in to assess if silver nitrate needed which it was decided no treatment needed. Also reviewed her edema ,BP, and that she had taken her 5 days of lasix. No new orders. Reviewed wound care with patient and signs of preeclampsia, postpartum appointments. She voices understanding. Staci Acosta

## 2022-02-05 ENCOUNTER — Encounter: Payer: Self-pay | Admitting: Obstetrics & Gynecology

## 2022-02-06 ENCOUNTER — Other Ambulatory Visit: Payer: Self-pay

## 2022-02-06 MED ORDER — METRONIDAZOLE 500 MG PO TABS: 500.0000 mg | ORAL_TABLET | Freq: Two times a day (BID) | ORAL | 0 refills | Status: DC

## 2022-02-11 NOTE — Progress Notes (Signed)
Patient was assessed and managed by nursing staff during this encounter. I have reviewed the chart and agree with the documentation and plan. I have also made any necessary editorial changes.  Emeterio Reeve, MD 02/11/2022 7:00 PM

## 2022-03-06 ENCOUNTER — Other Ambulatory Visit: Payer: Self-pay

## 2022-03-06 DIAGNOSIS — O24429 Gestational diabetes mellitus in childbirth, unspecified control: Secondary | ICD-10-CM

## 2022-03-07 ENCOUNTER — Encounter: Payer: Self-pay | Admitting: Family Medicine

## 2022-03-07 ENCOUNTER — Other Ambulatory Visit: Payer: Self-pay

## 2022-03-07 ENCOUNTER — Ambulatory Visit: Payer: Medicaid Other | Admitting: Family Medicine

## 2022-03-07 DIAGNOSIS — Z8632 Personal history of gestational diabetes: Secondary | ICD-10-CM | POA: Diagnosis not present

## 2022-03-07 DIAGNOSIS — O1414 Severe pre-eclampsia complicating childbirth: Secondary | ICD-10-CM

## 2022-03-07 DIAGNOSIS — O24415 Gestational diabetes mellitus in pregnancy, controlled by oral hypoglycemic drugs: Secondary | ICD-10-CM

## 2022-03-07 DIAGNOSIS — D62 Acute posthemorrhagic anemia: Secondary | ICD-10-CM

## 2022-03-07 DIAGNOSIS — O3663X0 Maternal care for excessive fetal growth, third trimester, not applicable or unspecified: Secondary | ICD-10-CM

## 2022-03-07 MED ORDER — NORETHINDRONE 0.35 MG PO TABS: 1.0000 | ORAL_TABLET | Freq: Every day | ORAL | 11 refills | Status: DC

## 2022-03-07 NOTE — Progress Notes (Signed)
[place] Partum Visit Note  Alicia Terry is a 32 y.o. [phone] female who presents for a postpartum visit. She is 6 weeks postpartum following a primary cesarean section.  I have fully reviewed the prenatal and intrapartum course. The delivery was at 67w0dgestational weeks.  Anesthesia: epidural. Postpartum course has been uncomplicated. Baby is doing well. Baby is feeding by both breast and bottle - Similac Advance. Bleeding  currently on first cycle since delivery . Bowel function is normal. Bladder function is normal. Patient is sexually active. Contraception method is  POP's . Postpartum depression screening: negative.   The pregnancy intention screening data noted above was reviewed. Potential methods of contraception were discussed. The patient elected to proceed with No data recorded.   Edinburgh Postnatal Depression Scale - 03/07/22 1037       Edinburgh Postnatal Depression Scale:  In the Past 7 Days   I have been able to laugh and see the funny side of things. 0    I have looked forward with enjoyment to things. 0    I have blamed myself unnecessarily when things went wrong. 0    I have been anxious or worried for no good reason. 0    I have felt scared or panicky for no good reason. 0    Things have been getting on top of me. 0    I have been so unhappy that I have had difficulty sleeping. 0    I have felt sad or miserable. 0    I have been so unhappy that I have been crying. 0    The thought of harming myself has occurred to me. 0    Edinburgh Postnatal Depression Scale Total 0             Health Maintenance Due  Topic Date Due   COVID-19 Vaccine (1) Never done   FOOT EXAM  Never done   OPHTHALMOLOGY EXAM  Never done    The following portions of the patient's history were reviewed and updated as appropriate: allergies, current medications, past family history, past medical history, past social history, past surgical history, and problem list.  Review of  Systems Pertinent items are noted in HPI.  Objective:  BP 128/84   Pulse 88   Wt 281 lb 12.8 oz (127.8 kg)   LMP 03/03/2022 (Exact Date)   Breastfeeding Yes   BMI 42.85 kg/m    General:  alert, cooperative, and appears stated age   Breasts:   Not indicated, no breast concerns  Lungs: clear to auscultation bilaterally  Heart:  regular rate and rhythm, S1, S2 normal, no murmur, click, rub or gallop  Abdomen: soft, non-tender; bowel sounds normal; no masses,  no organomegaly   Wound NA  GU exam:  not indicated       Assessment:   Normal postpartum exam.   Plan:   Essential components of care per ACOG recommendations:  1.  Mood and well being: Patient with negative depression screening today. Reviewed local resources for support.  - Patient tobacco use? No.   - hx of drug use? No.    2. Infant care and feeding:  -Patient currently breastmilk feeding? Yes. Reviewed importance of draining breast regularly to support lactation.  -Social determinants of health (SDOH) reviewed in EPIC. No concerns  3. Sexuality, contraception and birth spacing - Patient does not want a pregnancy in the next year.  Desired family size is 3 children.  - Reviewed reproductive life planning.  Reviewed contraceptive methods based on pt preferences and effectiveness.  Patient desired Oral Contraceptive today.   - Discussed birth spacing of 18 months  4. Sleep and fatigue -Encouraged family/partner/community support of 4 hrs of uninterrupted sleep to help with mood and fatigue  5. Physical Recovery  - Discussed patients delivery and complications. She describes her labor as good. - Patient had a C-section emergent. Perineal healing reviewed. Patient expressed understanding - Patient has urinary incontinence? No. - Patient is safe to resume physical and sexual activity  6.  Health Maintenance - HM due items addressed Yes - Last pap smear  Diagnosis  Date Value Ref Range Status  10/04/2020   Final    - Negative for intraepithelial lesion or malignancy (NILM)   Pap smear not done at today's visit.  -Breast Cancer screening indicated? No.   7. Chronic Disease/Pregnancy Condition follow up: Hypertension and Gestational Diabetes  - GDM: Needs to return for 2hr GTT  - Severe PEC: BP WNL today. Recommended stopping procardia and then recheck of BP off medication. Has PCP visit and will have repeat CP at that time. Referred to [facility] cardiology -Acute Blood Loss anemia-- repeat CBC with GTT  - PCP follow up  Future Appointments  Date Time Provider [place]  03/21/2022  1:40 PM Marnee Guarneri T, NP [facility]  05/25/2022  1:40 PM Johney Frame, Greer Ee, MD [facility] None     Caren Macadam, [place] for [facility], [place]

## 2022-03-09 ENCOUNTER — Encounter: Payer: Self-pay | Admitting: Family Medicine

## 2022-03-18 NOTE — Patient Instructions (Incomplete)

## 2022-03-21 ENCOUNTER — Ambulatory Visit: Payer: Medicaid Other | Admitting: Nurse Practitioner

## 2022-03-21 DIAGNOSIS — Z8759 Personal history of other complications of pregnancy, childbirth and the puerperium: Secondary | ICD-10-CM

## 2022-03-21 DIAGNOSIS — R7309 Other abnormal glucose: Secondary | ICD-10-CM

## 2022-03-21 DIAGNOSIS — Z13220 Encounter for screening for lipoid disorders: Secondary | ICD-10-CM

## 2022-03-21 DIAGNOSIS — Z8632 Personal history of gestational diabetes: Secondary | ICD-10-CM

## 2022-04-11 NOTE — Progress Notes (Signed)
[facility]  Follow-up Visit  Date:  04/13/2022   ID:  Alicia Terry, DOB [birth year], MRN [id number]  PCP:  Venita Lick, NP   [facility] Providers Cardiologist:  None  Electrophysiologist:  None     Referring MD: Venita Lick, NP   Chief Complaint: morbid obesity, pre-diabetes  History of Present Illness:    Alicia Terry is a 32 y.o. female [G3P1111] who presents to clinic for follow-up.  Patient seen in clinic by Dr. Harolyn Rutherford on 07/27/21 where she was seen for high risk pregnancy. She was started on ASA '81mg'$  daily and referred to Cardio-OB for further risk stratification given morbid obesity.  Was seen in clinic on 09/01/21 where she was doing well. Was going to try to start exercising. Had been diagnosed with pre-diabetes in pregnancy and was working with a dietician to help.   Was seen in MAU on 01/09/22 with elevated blood pressure 164/103. Protein/Cr ratio elevated at 0.4. Diagnosed with pre-eclampsia. She was stabilized and discharged home.   Was last seen in clinic on 01/2022 where her BP was elevated. We started her on nifedipine at that time. She was delivered at 37 weeks for severe pre-eclampsia. Required c-section for nonreassuring FHR. Post-partum she suffered from persistently elevated BP. She was started on nifedipine but left AMA prior to BP control.  Was seen by Dr. Ernestina Patches in follow-up and BP was better controlled. Her procardia was stopped at that time.  Today, the patient is doing well today. Blood pressure is well controlled off medications. Has been running 120/70s at home. No chest pain, SOB, orthopnea, PND, or LE edema. She is increasing her activity and now that she has recovered from her c-section.   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   metFORMIN (GLUCOPHAGE) 500 MG tablet Take 500 mg by mouth 2 (two) times daily.   NIFEdipine (PROCARDIA-XL/NIFEDICAL-XL) 30 MG 24 hr tablet Take 1 tablet (30 mg total) by mouth 2 (two) times daily.   norethindrone (MICRONOR) 0.35 MG tablet Take 1 tablet (0.35 mg total) by mouth daily.   Prenatal Vit-Fe Fumarate-FA (MULTIVITAMIN-PRENATAL) 27-0.8 MG TABS tablet Take 1 tablet by mouth daily at 12 noon.     Allergies:   Patient  has no known allergies.   Social History   Socioeconomic History   Marital status: Single    Spouse name: Not on file   Number of children: Not on file   Years of education: Not on file   Highest education level: Not on file  Occupational History   Occupation: Beautician  Tobacco Use   Smoking status: Never   Smokeless tobacco: Never  Vaping Use   Vaping Use: Never used  Substance and Sexual Activity   Alcohol use: Not Currently   Drug use: Not Currently    Types: Marijuana    Comment: last use may 2023   Sexual activity: Yes    Birth control/protection: None    Comment: sexually active yesterday  Other Topics Concern   Not on file  Social History Narrative   Not on file   Social Determinants of Health   Financial Resource Strain: Low Risk  (08/05/2020)   Overall  Financial Resource Strain (CARDIA)    Difficulty of Paying Living Expenses: Not hard at all  Food Insecurity: No Food Insecurity (01/22/2022)   Hunger Vital Sign    Worried About Running Out of Food in the Last Year: Never true    Ran Out of Food in the Last Year: Never true  Transportation Needs: No Transportation Needs (01/22/2022)   PRAPARE - Hydrologist (Medical): No    Lack of Transportation (Non-Medical): No  Physical Activity: Sufficiently Active (08/05/2020)   Exercise Vital Sign    Days of Exercise per Week: 7 days    Minutes of Exercise per Session: 30 min  Stress: No Stress Concern Present (08/05/2020)   [place]    Feeling of Stress : Only a little  Social Connections: Socially Isolated (08/05/2020)   Social Connection and Isolation Panel [NHANES]    Frequency of Communication with Friends and Family: Twice a week    Frequency of Social Gatherings with Friends and Family: Twice a week    Attends Religious Services: Never    Marine scientist or Organizations: No    Attends Music therapist: Never    Marital Status: Never married      Family History  Problem Relation Age of Onset   Diabetes Mother    Hypertension Mother    Healthy Father    Diabetes Sister    Heart disease Maternal Grandmother    Hypertension Maternal Grandmother    Depression Maternal Grandmother    Heart disease Maternal Grandfather    Hypertension Maternal Grandfather    Depression Maternal Grandfather    Dementia Paternal Grandmother       ROS:   Please see the history of present illness.    All other systems reviewed and are negative.   Labs/EKG Reviewed:    EKG:   No new ECG today  Recent Labs: 07/27/2021: TSH 0.961 01/23/2022: Magnesium 4.8 01/24/2022: ALT 13; BUN 9; Creatinine, Ser 1.01; Hemoglobin 8.8; Platelets 175; Potassium 4.5; Sodium 131   Recent Lipid  Panel Lab Results  Component Value Date/Time   CHOL 135 08/05/2020 01:55 PM   TRIG 34 08/05/2020 01:55 PM   HDL 50 08/05/2020 01:55 PM   LDLCALC 76 08/05/2020 01:55 PM    Physical Exam:    VS:  BP 138/86   Ht '5\' 8"'$  (1.727 m)   Wt 289 lb 12.8 oz (131.5 kg)   LMP 05/09/2021 (Exact Date)   BMI 44.06 kg/m  Wt Readings from Last 3 Encounters:  04/13/22 289 lb 12.8 oz (131.5 kg)  03/07/22 281 lb 12.8 oz (127.8 kg)  02/01/22 279 lb 14.4 oz (127 kg)     GEN:  Comfortable, NAD HEENT: Normal NECK: No JVD, no carotid bruits CARDIAC: RRR, soft systolic flow murmur RESPIRATORY:  CTAB, no wheezes ABDOMEN: Gravid, soft MUSCULOSKELETAL:  No edema; No deformity  SKIN: Warm and dry NEUROLOGIC:  Alert and oriented x 3 PSYCHIATRIC:  Normal affect    Risk Assessment/Risk Calculators:   { ASSESSMENT & PLAN:    #Severe Pre-Eclampsia in 3rd Trimester: Delivered at 35 due to severe pre-eclampsia. Had persistent HTN postpartum but BP improved and she has since been weaned off nifedipine. Currently, doing well off medications. Will continue with lifestyle modifications. -Continue lifestyle modifications  #Diabetes in Pregnancy: A1C 6.5. Now off metformin. BG now back to normal and is starting an exercise program.  #Morbid Obesity: Working hard on weight loss. -Continue exercise with goal 135mn of moderate intensity exercise per week  There are no Patient Instructions on file for this visit.   Dispo:  No follow-ups on file.   Medication Adjustments/Labs and Tests Ordered: Current medicines are reviewed at length with the patient today.  Concerns regarding medicines are outlined above.  Tests Ordered: No orders of the defined types were placed in this encounter.  Medication Changes: No orders of the defined types were placed in this encounter.

## 2022-04-13 ENCOUNTER — Encounter: Payer: Self-pay | Admitting: Cardiology

## 2022-04-13 ENCOUNTER — Ambulatory Visit (INDEPENDENT_AMBULATORY_CARE_PROVIDER_SITE_OTHER): Payer: Medicaid Other | Admitting: Cardiology

## 2022-04-13 VITALS — BP 138/86 | Ht 68.0 in | Wt 289.8 lb

## 2022-04-13 DIAGNOSIS — O99210 Obesity complicating pregnancy, unspecified trimester: Secondary | ICD-10-CM

## 2022-04-13 DIAGNOSIS — O133 Gestational [pregnancy-induced] hypertension without significant proteinuria, third trimester: Secondary | ICD-10-CM

## 2022-04-13 DIAGNOSIS — Z8759 Personal history of other complications of pregnancy, childbirth and the puerperium: Secondary | ICD-10-CM

## 2022-04-13 DIAGNOSIS — O24415 Gestational diabetes mellitus in pregnancy, controlled by oral hypoglycemic drugs: Secondary | ICD-10-CM

## 2022-04-13 NOTE — Patient Instructions (Signed)
Medication Instructions:   Your physician recommends that you continue on your current medications as directed. Please refer to the Current Medication list given to you today.  *If you need a refill on your cardiac medications before your next appointment, please call your pharmacy*    Follow-Up:  AS NEEDED WITH DR. Johney Frame HERE AT [facility]

## 2022-05-25 ENCOUNTER — Ambulatory Visit: Payer: Medicaid Other | Admitting: Cardiology

## 2022-07-09 ENCOUNTER — Ambulatory Visit
Admission: EM | Admit: 2022-07-09 | Discharge: 2022-07-09 | Disposition: A | Discharge status: Home / Self Care | Payer: Medicaid Other | Attending: Emergency Medicine | Admitting: Emergency Medicine

## 2022-07-09 DIAGNOSIS — N898 Other specified noninflammatory disorders of vagina: Secondary | ICD-10-CM

## 2022-07-09 MED ORDER — FLUCONAZOLE 150 MG PO TABS: ORAL_TABLET | ORAL | 0 refills | Status: DC

## 2022-07-09 MED ORDER — METRONIDAZOLE 500 MG PO TABS: 500.0000 mg | ORAL_TABLET | Freq: Two times a day (BID) | ORAL | 0 refills | Status: DC

## 2022-07-09 NOTE — ED Provider Notes (Signed)
Renaldo Terry    CSN: [id number] Arrival date & time: 07/09/22  1918      History   Chief Complaint Chief Complaint  Patient presents with   Vaginal Discharge    Yeast - Entered by patient    HPI Alicia Terry is a 32 y.o. female.   Patient presents for evaluation of vaginal discharge without itching or odor urinary frequency present for 1 day.  Endorses that she recently wore a new bathing suit while vacationing, did not wash the material prior.  History of reoccurring vaginitis.  Has not attempted treatment.  Denies urgency, dysuria, hematuria, lower abdominal pain or pressure, flank pain or fevers.  Currently lactating.  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   pos HSV 1 IgG   Pregnancy induced hypertension     Patient Active Problem List   Diagnosis Date Noted   S/P cesarean section 01/23/2022   History of severe pre-eclampsia 01/05/2022   History of gestational diabetes mellitus (GDM) 08/21/2021   Alpha thalassemia silent carrier 08/18/2021   History of fetal demise, not currently pregnant 07/30/2020    Past Surgical History:  Procedure Laterality Date   CESAREAN SECTION N/A 01/23/2022   Procedure: CESAREAN SECTION;  Surgeon: Tereso Newcomer, MD;  Location: [facility] LD [facility]S;  Service: Obstetrics;  Laterality: N/A;   CYST EXCISION     Chest   WISDOM TOOTH EXTRACTION      OB History     Gravida  3   Para  2   Term  1   Preterm  1   AB  1   Living  1      SAB  0   IAB  0   Ectopic  1   Multiple  0   Live Births  1            Home Medications    Prior to Admission medications   Medication Sig Start Date End Date Taking? Authorizing Provider  fluconazole (DIFLUCAN) 150 MG tablet Take 1 tablet on day 1 then take 2nd tablet on day 8 07/09/22  Yes Lessie Manigo R, NP   metroNIDAZOLE (FLAGYL) 500 MG tablet Take 1 tablet (500 mg total) by mouth 2 (two) times daily. 07/09/22  Yes Xane Amsden, Elita Boone, NP  Cholecalciferol (VITAMIN D3 GUMMIES ADULT PO) Take by mouth.    [provider]  metFORMIN (GLUCOPHAGE) 500 MG tablet Take 500 mg by mouth 2 (two) times daily. 01/31/22   [provider]  NIFEdipine (PROCARDIA-XL/NIFEDICAL-XL) 30 MG 24 hr tablet Take 1 tablet (30 mg total) by mouth 2 (two) times daily. 01/25/22   Anyanwu, Jethro Bastos, MD  norethindrone (MICRONOR) 0.35 MG tablet Take 1 tablet (0.35 mg total) by mouth daily. 03/07/22   Federico Flake, MD  Prenatal Vit-Fe Fumarate-FA (MULTIVITAMIN-PRENATAL) 27-0.8 MG TABS tablet Take 1 tablet by mouth daily at 12 noon.    [provider]    Family History Family History  Problem Relation Age of Onset   Diabetes Mother    Hypertension Mother    Healthy Father    Diabetes Sister    Heart disease Maternal Grandmother    Hypertension Maternal Grandmother    Depression Maternal Grandmother    Heart disease Maternal Grandfather    Hypertension  Maternal Grandfather    Depression Maternal Grandfather    Dementia Paternal Grandmother     Social History Social History   Tobacco Use   Smoking status: Never   Smokeless tobacco: Never  Vaping Use   Vaping Use: Never used  Substance Use Topics   Alcohol use: Not Currently   Drug use: Not Currently    Types: Marijuana    Comment: last use may 2023     Allergies   Patient has no known allergies.   Review of Systems Review of Systems  Genitourinary:  Positive for vaginal discharge.     Physical Exam Triage Vital Signs ED Triage Vitals [07/09/22 1925]  Enc Vitals Group     BP      Pulse      Resp      Temp      Temp src      SpO2      Weight      Height      Head Circumference      Peak Flow      Pain Score 0     Pain Loc      Pain Edu?      Excl. in GC?    No data found.  Updated Vital Signs LMP  06/25/2022 (Approximate)   Visual Acuity Right Eye Distance:   Left Eye Distance:   Bilateral Distance:    Right Eye Near:   Left Eye Near:    Bilateral Near:     Physical Exam Constitutional:      Appearance: Normal appearance.  Eyes:     Extraocular Movements: Extraocular movements intact.  Pulmonary:     Effort: Pulmonary effort is normal.  Genitourinary:    Comments: deferred Neurological:     Mental Status: She is alert and oriented to person, place, and time.      UC Treatments / Results  Labs (all labs ordered are listed, but only abnormal results are displayed) Labs Reviewed  CERVICOVAGINAL ANCILLARY ONLY    EKG   Radiology No results found.  Procedures Procedures (including critical care time)  Medications Ordered in UC Medications - No data to display  Initial Impression / Assessment and Plan / UC Course  I have reviewed the triage vital signs and the nursing notes.  Pertinent labs & imaging results that were available during my care of the patient were reviewed by me and considered in my medical decision making (see chart for details).  Vaginal discharge  Treating prophylactically for yeast and BV, metronidazole and Diflucan sent to pharmacy, discussed administration, STI labs pending will treat per protocol, advised abstinence until lab results, and/or treatment is complete, advised condom use during all sexual encounters moving, may follow-up with urgent care as needed  Final Clinical Impressions(s) / UC Diagnoses   Final diagnoses:  Vaginal discharge     Discharge Instructions      Today you are being treated prophylactically for  Bacterial vaginosis and yeast  Take Metronidazole 500 mg twice a day for 7 days, do not drink alcohol while using medication, this will make you feel sick   Take 1 Diflucan tablet when you receive your medicine and then after completion of all antibiotics take second dose  Bacterial vaginosis and yeast  results from an overgrowth of one on several organisms that are normally present in your vagina. Vaginosis is an inflammation of the vagina that can result in discharge, itching and pain.  Labs pending  you will be  contacted if positive for any sti and treatment will be sent to the pharmacy, you will have to return to the clinic if positive for gonorrhea to receive treatment   Please refrain from having sex until labs results, if positive please refrain from having sex until treatment complete and symptoms resolve   If positive for  Chlamydia  gonorrhea or trichomoniasis please notify partner or partners so they may tested as well  Moving forward, it is recommended you use some form of protection against the transmission of sti infections  such as condoms or dental dams with each sexual encounter     In addition: Avoid baths, hot tubs and whirlpool spas.  Don't use scented or harsh soaps Avoid irritants. These include scented tampons and pads. Wipe from front to back after using the toilet. Don't douche. Your vagina doesn't require cleansing other than normal bathing.  Use a condom.  Wear cotton underwear, this fabric absorbs some moisture.        ED Prescriptions     Medication Sig Dispense Auth. Provider   metroNIDAZOLE (FLAGYL) 500 MG tablet Take 1 tablet (500 mg total) by mouth 2 (two) times daily. 14 tablet Lottie Siska R, NP   fluconazole (DIFLUCAN) 150 MG tablet Take 1 tablet on day 1 then take 2nd tablet on day 8 2 tablet Cesiah Westley, Elita Boone, NP      PDMP not reviewed this encounter.   Valinda Hoar, NP 07/09/22 1939

## 2022-07-09 NOTE — ED Triage Notes (Signed)
Patient presents to [facility] for vaginal discharge x 1 day. Hx of BV. Req STD testing.

## 2022-07-09 NOTE — Discharge Instructions (Addendum)
Today you are being treated prophylactically for  Bacterial vaginosis and yeast  Take Metronidazole 500 mg twice a day for 7 days, do not drink alcohol while using medication, this will make you feel sick   Take 1 Diflucan tablet when you receive your medicine and then after completion of all antibiotics take second dose  Bacterial vaginosis and yeast results from an overgrowth of one on several organisms that are normally present in your vagina. Vaginosis is an inflammation of the vagina that can result in discharge, itching and pain.  Labs pending  you will be contacted if positive for any sti and treatment will be sent to the pharmacy, you will have to return to the clinic if positive for gonorrhea to receive treatment   Please refrain from having sex until labs results, if positive please refrain from having sex until treatment complete and symptoms resolve   If positive for  Chlamydia  gonorrhea or trichomoniasis please notify partner or partners so they may tested as well  Moving forward, it is recommended you use some form of protection against the transmission of sti infections  such as condoms or dental dams with each sexual encounter     In addition: Avoid baths, hot tubs and whirlpool spas.  Don't use scented or harsh soaps Avoid irritants. These include scented tampons and pads. Wipe from front to back after using the toilet. Don't douche. Your vagina doesn't require cleansing other than normal bathing.  Use a condom.  Wear cotton underwear, this fabric absorbs some moisture.

## 2022-07-10 LAB — CERVICOVAGINAL ANCILLARY ONLY
Bacterial Vaginitis (gardnerella): NEGATIVE
Candida Glabrata: NEGATIVE
Candida Vaginitis: POSITIVE — AB
Chlamydia: NEGATIVE
Comment: NEGATIVE
Comment: NEGATIVE
Comment: NEGATIVE
Comment: NEGATIVE
Comment: NEGATIVE
Comment: NORMAL
Neisseria Gonorrhea: NEGATIVE
Trichomonas: NEGATIVE

## 2022-11-05 DIAGNOSIS — H5213 Myopia, bilateral: Secondary | ICD-10-CM | POA: Diagnosis not present

## 2022-11-09 ENCOUNTER — Ambulatory Visit (INDEPENDENT_AMBULATORY_CARE_PROVIDER_SITE_OTHER): Payer: Medicaid Other

## 2022-11-09 DIAGNOSIS — Z3201 Encounter for pregnancy test, result positive: Secondary | ICD-10-CM | POA: Diagnosis not present

## 2022-11-09 MED ORDER — PREPLUS 27-1 MG PO TABS
1.0000 | ORAL_TABLET | Freq: Every day | ORAL | 11 refills | Status: AC
Start: 2022-11-09 — End: ?

## 2022-11-09 NOTE — Progress Notes (Cosign Needed)
Possible Pregnancy  Here today for pregnancy confirmation; patient left urine sample in office to be tested and would like call for results. UPT in office today is positive. Called patient at number given to front office; verified patient with full name and DOB. Pt reports first positive home UPT on 11/05/22. Reviewed dating with patient:   LMP: 09/30/22--regular EDD: 07/07/22 5w 5d today Patient is a G4P1 with hx of PPROM x1 and PTLx2. First pregnancy in 2015, patient ROM at [redacted]w[redacted]d; second pregnancy patient ROM at 36w, also dx'd with pre-e and had c-section d/t non-reassuring FHR for second pregnancy in 2023.    OB history reviewed. Reviewed medications and allergies with patient; list of medications safe to take during pregnancy given.  Recommended pt begin prenatal vitamin and schedule prenatal care. Sent front office message to schedule patient's new OB intake and new OB appointment.   Meryl Crutch, RN 11/09/2022  12:11 PM   Reviewed documentation.  Patient will need serial [place] to r/o shortened cervix given hx of PTL and PPROM.  Will need vaginal progesterone if evidence of shortened cervix.   Mittie Bodo, MD Family Medicine - Obstetrics Fellow

## 2022-11-12 LAB — POCT PREGNANCY, URINE: Preg Test, Ur: POSITIVE — AB

## 2022-12-11 ENCOUNTER — Telehealth: Payer: Medicaid Other

## 2022-12-11 ENCOUNTER — Inpatient Hospital Stay (HOSPITAL_COMMUNITY): Payer: Medicaid Other

## 2022-12-11 ENCOUNTER — Inpatient Hospital Stay (HOSPITAL_COMMUNITY)
Admission: AD | Admit: 2022-12-11 | Discharge: 2022-12-12 | Disposition: A | Discharge status: Home / Self Care | Payer: Medicaid Other | Attending: Obstetrics and Gynecology | Admitting: Obstetrics and Gynecology

## 2022-12-11 DIAGNOSIS — O0990 Supervision of high risk pregnancy, unspecified, unspecified trimester: Secondary | ICD-10-CM

## 2022-12-11 DIAGNOSIS — Z3A09 9 weeks gestation of pregnancy: Secondary | ICD-10-CM

## 2022-12-11 DIAGNOSIS — O0991 Supervision of high risk pregnancy, unspecified, first trimester: Secondary | ICD-10-CM

## 2022-12-11 DIAGNOSIS — O26851 Spotting complicating pregnancy, first trimester: Secondary | ICD-10-CM

## 2022-12-11 DIAGNOSIS — O3680X0 Pregnancy with inconclusive fetal viability, not applicable or unspecified: Secondary | ICD-10-CM | POA: Diagnosis not present

## 2022-12-11 LAB — CBC
HCT: 36.5 % (ref 36.0–46.0)
Hemoglobin: 11.8 g/dL — ABNORMAL LOW (ref 12.0–15.0)
MCH: 25.8 pg — ABNORMAL LOW (ref 26.0–34.0)
MCHC: 32.3 g/dL (ref 30.0–36.0)
MCV: 79.9 fL — ABNORMAL LOW (ref 80.0–100.0)
Platelets: 265 10*3/uL (ref 150–400)
RBC: 4.57 MIL/uL (ref 3.87–5.11)
RDW: 13.4 % (ref 11.5–15.5)
WBC: 8.6 10*3/uL (ref 4.0–10.5)
nRBC: 0 % (ref 0.0–0.2)

## 2022-12-11 LAB — URINALYSIS, ROUTINE W REFLEX MICROSCOPIC
Bilirubin Urine: NEGATIVE
Glucose, UA: NEGATIVE mg/dL
Hgb urine dipstick: NEGATIVE
Ketones, ur: NEGATIVE mg/dL
Leukocytes,Ua: NEGATIVE
Nitrite: NEGATIVE
Protein, ur: NEGATIVE mg/dL
Specific Gravity, Urine: 1.028 (ref 1.005–1.030)
pH: 5 (ref 5.0–8.0)

## 2022-12-11 LAB — WET PREP, GENITAL
Clue Cells Wet Prep HPF POC: NONE SEEN
Sperm: NONE SEEN
Trich, Wet Prep: NONE SEEN
WBC, Wet Prep HPF POC: >=10 — AB (ref <10)
Yeast Wet Prep HPF POC: NONE SEEN

## 2022-12-11 LAB — COMPREHENSIVE METABOLIC PANEL
ALT: 18 U/L (ref 0–44)
AST: 16 U/L (ref 15–41)
Albumin: 3.8 g/dL (ref 3.5–5.0)
Alkaline Phosphatase: 43 U/L (ref 38–126)
Anion gap: 8 (ref 5–15)
BUN: 7 mg/dL (ref 6–20)
CO2: 23 mmol/L (ref 22–32)
Calcium: 9.4 mg/dL (ref 8.9–10.3)
Chloride: 106 mmol/L (ref 98–111)
Creatinine, Ser: 0.79 mg/dL (ref 0.44–1.00)
GFR, Estimated: >60 mL/min (ref >60)
Glucose, Bld: 133 mg/dL — ABNORMAL HIGH (ref 70–99)
Potassium: 3.3 mmol/L — ABNORMAL LOW (ref 3.5–5.1)
Sodium: 137 mmol/L (ref 135–145)
Total Bilirubin: 0.4 mg/dL (ref <1.2)
Total Protein: 6.7 g/dL (ref 6.5–8.1)

## 2022-12-11 LAB — ABO/RH: ABO/RH(D): O POS

## 2022-12-11 NOTE — MAU Note (Signed)
.  Alicia Terry is a 32 y.o. at [redacted]w[redacted]d here in MAU reporting spotting when she wiped about 1900 tonight. No pain tonight. Spotting has gone from light pink to brown.  LMP: 09/29/22 Onset of complaint: 1900 Pain score: 0 Vitals:   12/11/22 2301 12/11/22 2302  BP:  132/75  Pulse: 84   Resp: 18   Temp: 99.5 F (37.5 C)   SpO2: 100%      FHT:n/a Lab orders placed from triage:   none

## 2022-12-11 NOTE — Progress Notes (Signed)
New OB Intake  I connected with Alicia Terry  on 12/11/22 at  8:15 AM EST by MyChart Video Visit and verified that I am speaking with the correct person using two identifiers. Nurse is located at [facility] and pt is located at home.  I discussed the limitations, risks, security and privacy concerns of performing an evaluation and management service by telephone and the availability of in person appointments. I also discussed with the patient that there may be a patient responsible charge related to this service. The patient expressed understanding and agreed to proceed.  I explained I am completing New OB Intake today. We discussed EDD of 07/07/2023, by Last Menstrual Period. Pt is Alicia Terry. I reviewed her allergies, medications and Medical/Surgical/OB history.    Patient Active Problem List   Diagnosis Date Noted   S/P cesarean section 01/23/2022   History of severe pre-eclampsia 01/05/2022   History of gestational diabetes mellitus (GDM) 08/21/2021   Alpha thalassemia silent carrier 08/18/2021   History of fetal demise, not currently pregnant 07/30/2020    Concerns addressed today  Delivery Plans Plans to deliver at [facility]. Discussed the nature of our practice with multiple providers including residents and students. Due to the size of the practice, the delivering provider may not be the same as those providing prenatal care.   Patient is not interested in water birth. Offered upcoming OB visit with CNM to discuss further.  MyChart/Babyscripts MyChart access verified. I explained pt will have some visits in office and some virtually. Babyscripts instructions given and order placed. Patient verifies receipt of registration text/e-mail. Account successfully created and app downloaded.  Blood Pressure Cuff/Weight Scale Patient has blood pressure cuff. Explained after first prenatal appt pt will check weekly and document in Babyscripts.  Anatomy US Explained first scheduled [place] will  be around 19 weeks. Anatomy US scheduled for 02/18/2023 at 9:15am.  Is patient a CenteringPregnancy candidate?  Declined Declined due to Declined to say   Is patient a Mom+Baby Combined Care candidate?  Not a candidate   If accepted, confirm patient does not intend to move from the area for at least 12 months, then notify Mom+Baby staff  Interested in [place]? If yes, send referral and doula dot phrase.   Is patient a candidate for Babyscripts Optimization? no  First visit review I reviewed new OB appt with patient. Explained pt will be seen by Albertine Grates, FNP at first visit. Discussed Avelina Laine genetic screening with patient.  Panorama and Horizon.. Routine prenatal labs is needed at new ob visit.  Last Pap Diagnosis  Date Value Ref Range Status  10/04/2020   Final   - Negative for intraepithelial lesion or malignancy (NILM)    Alicia Terry, CMA 12/11/2022  8:16 AM

## 2022-12-11 NOTE — Patient Instructions (Signed)
Safe Medications in Pregnancy   Acne:  Benzoyl Peroxide  Salicylic Acid   Backache/Headache:  Tylenol: 2 regular strength every 4 hours OR               2 Extra strength every 6 hours   Colds/Coughs/Allergies:  Benadryl (alcohol free) 25 mg every 6 hours as needed  Breath right strips  Claritin  Cepacol throat lozenges  Chloraseptic throat spray  Cold-Eeze- up to three times per day  Cough drops, alcohol free  Flonase (by prescription only)  Guaifenesin  Mucinex  Robitussin DM (plain only, alcohol free)  Saline nasal spray/drops  Sudafed (pseudoephedrine) & Actifed * use only after [redacted] weeks gestation and if you do not have high blood pressure  Tylenol  Vicks Vaporub  Zinc lozenges  Zyrtec   Constipation:  Colace  Ducolax suppositories  Fleet enema  Glycerin suppositories  Metamucil  Milk of magnesia  Miralax  Senokot  Smooth move tea   Diarrhea:  Kaopectate  Imodium A-D   *NO pepto Bismol   Hemorrhoids:  Anusol  Anusol HC  Preparation H  Tucks   Indigestion:  Tums  Maalox  Mylanta  Zantac  Pepcid   Insomnia:  Benadryl (alcohol free) 25mg  every 6 hours as needed  Tylenol PM  Unisom, no Gelcaps   Leg Cramps:  Tums  MagGel   Nausea/Vomiting:  Bonine  Dramamine  Emetrol  Ginger extract  Sea bands  Meclizine  Nausea medication to take during pregnancy:  Unisom (doxylamine succinate 25 mg tablets) Take one tablet daily at bedtime. If symptoms are not adequately controlled, the dose can be increased to a maximum recommended dose of two tablets daily (1/2 tablet in the morning, 1/2 tablet mid-afternoon and one at bedtime).  Vitamin B6 100mg  tablets. Take one tablet twice a day (up to 200 mg per day).   Skin Rashes:  Aveeno products  Benadryl cream or 25mg  every 6 hours as needed  Calamine Lotion  1% cortisone cream   Yeast infection:  Gyne-lotrimin 7  Monistat 7    **If taking multiple medications, please check labels to avoid  duplicating the same active ingredients  **take medication as directed on the label  ** Do not exceed 4000 mg of tylenol in 24 hours  **Do not take medications that contain aspirin or ibuprofen           CenteringPregnancy is a model of prenatal care that started 30 years ago and is used in about 600 practices around the [place]. You meet with a group of 8-12 women due around the same time as you. In Centering you will have individual time with the provider and meet as a group. There's much more time for discussion and learning. You will actually have much more time with your provider in Centering than in traditional prenatal care.? You will come directly into the Centering room and will not wait in the lobby so there is no wasted time. You will have 2-hour visits every 4 weeks then every 2 weeks. You will know your Centering prenatal appointments in advance. In your last month of pregnancy, you may also come in for some individual visits. Additional appointments can be scheduled if you need more care. Studies have shown that CenteringPregnancy improves birth outcomes. We have seen especially big improvements in fewer Black women delivering babies who are too small or born too early. Visit the website [facility]eringHealthcare for more information. Let your provider or clinic staff know if you want to  sign up or email [email or web address] for more information.   CenteringPregnancy Video

## 2022-12-12 LAB — GC/CHLAMYDIA PROBE AMP (~~LOC~~) NOT AT ARMC
Chlamydia: NEGATIVE
Comment: NEGATIVE
Comment: NORMAL
Neisseria Gonorrhea: NEGATIVE

## 2022-12-12 NOTE — Progress Notes (Signed)
WRitten and verbal d/c instructions given by Artelia Laroche CNM and pt voiced understanding. Did not want her d/c papers

## 2022-12-17 ENCOUNTER — Encounter: Payer: Self-pay | Admitting: Obstetrics and Gynecology

## 2022-12-17 ENCOUNTER — Other Ambulatory Visit: Payer: Self-pay

## 2022-12-17 ENCOUNTER — Ambulatory Visit (INDEPENDENT_AMBULATORY_CARE_PROVIDER_SITE_OTHER): Payer: Medicaid Other

## 2022-12-17 ENCOUNTER — Other Ambulatory Visit (HOSPITAL_COMMUNITY)
Admission: RE | Admit: 2022-12-17 | Discharge: 2022-12-17 | Disposition: A | Discharge status: Home / Self Care | Payer: Medicaid Other | Source: Ambulatory Visit | Attending: Obstetrics and Gynecology | Admitting: Obstetrics and Gynecology

## 2022-12-17 ENCOUNTER — Other Ambulatory Visit: Payer: Self-pay | Admitting: Obstetrics and Gynecology

## 2022-12-17 ENCOUNTER — Ambulatory Visit (INDEPENDENT_AMBULATORY_CARE_PROVIDER_SITE_OTHER): Payer: Medicaid Other | Admitting: Obstetrics and Gynecology

## 2022-12-17 VITALS — BP 125/88 | HR 89 | Wt 282.5 lb

## 2022-12-17 DIAGNOSIS — Z8632 Personal history of gestational diabetes: Secondary | ICD-10-CM | POA: Diagnosis not present

## 2022-12-17 DIAGNOSIS — O0991 Supervision of high risk pregnancy, unspecified, first trimester: Secondary | ICD-10-CM | POA: Diagnosis not present

## 2022-12-17 DIAGNOSIS — Z8759 Personal history of other complications of pregnancy, childbirth and the puerperium: Secondary | ICD-10-CM | POA: Diagnosis not present

## 2022-12-17 DIAGNOSIS — Z3A10 10 weeks gestation of pregnancy: Secondary | ICD-10-CM

## 2022-12-17 DIAGNOSIS — O0990 Supervision of high risk pregnancy, unspecified, unspecified trimester: Secondary | ICD-10-CM | POA: Diagnosis not present

## 2022-12-17 DIAGNOSIS — Z98891 History of uterine scar from previous surgery: Secondary | ICD-10-CM

## 2022-12-17 DIAGNOSIS — Z1332 Encounter for screening for maternal depression: Secondary | ICD-10-CM | POA: Diagnosis not present

## 2022-12-17 MED ORDER — ASPIRIN 81 MG PO TBEC
162.0000 mg | DELAYED_RELEASE_TABLET | Freq: Every day | ORAL | 2 refills | Status: DC
Start: 2022-12-17 — End: 2023-06-24

## 2022-12-17 NOTE — Progress Notes (Signed)
INITIAL PRENATAL VISIT  Subjective:   Alicia Terry is being seen today for her first obstetrical visit.   She is at [redacted]w[redacted]d gestation by LMP Her obstetrical history is significant for  previous csection, hx of GDM, hx severe preeclampsia . Relationship with FOB:  partner involved, not living together . Patient does intend to breast feed. Pregnancy history fully reviewed.  Patient reports no complaints.  Indications for ASA therapy (per uptodate) One of the following: Previous pregnancy with preeclampsia, especially early onset and with an adverse outcome Yes   Indications for early GDM screening  First-degree relative with diabetes  BMI >30kg/m2 Yes Age > 25 Yes Previous birth of an infant weighing >=4000 g Yes Gestational diabetes mellitus in a previous pregnancy Yes   Early screening tests: FBS, A1C, Random CBG, glucose challenge  Objective:    Obstetric History OB History  Gravida Para Term Preterm AB Living  4 2 1 1 1 1   SAB IAB Ectopic Multiple Live Births  0 0 1 0 1    # Outcome Date GA Lbr Len/2nd Weight Sex Type Anes PTL Lv  4 Current           3 Term 01/23/22 [redacted]w[redacted]d / 05:16 9 lb 2 oz (4.14 kg) M CS-LTranv EPI  LIV  2 Preterm 07/21/19 [redacted]w[redacted]d   F Vag-Spont None  FD  1 Ectopic             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 30 tablet 11   No current facility-administered medications on file prior to visit.    No Known Allergies  Social History:  reports that she has never smoked. She has never used smokeless tobacco. She reports that she does not currently use alcohol. She reports that she does not currently use drugs after having used the following drugs: Marijuana.  Family History  Problem Relation Age of Onset   Diabetes Mother    Hypertension Mother    Healthy Father    Diabetes Sister    Heart disease Maternal Grandmother    Hypertension Maternal Grandmother    Depression Maternal Grandmother    Heart disease Maternal Grandfather    Hypertension Maternal Grandfather    Depression Maternal Grandfather    Dementia Paternal Grandmother     The following portions of the patient's history were reviewed and updated as appropriate: allergies, current medications, past family history, past medical history, past social history, past surgical history and problem list.  Review of Systems Review of Systems  All other systems reviewed and are negative.   Physical Exam:  LMP 09/30/2022 (Exact Date)  CONSTITUTIONAL: Well-developed, well-nourished female in no acute distress.  HENT:  Normocephalic, atraumatic EYES: Conjunctivae normal. NECK:  Normal range of motion  SKIN: Skin is warm and dry MUSCULOSKELETAL: Normal range of motion NEUROLOGIC: Alert and oriented  PSYCHIATRIC: Normal mood and affect. Normal behavior. Normal judgment and thought content. CARDIOVASCULAR: Normal heart rate noted RESPIRATORY: normal effort ABDOMEN: Soft, PELVIC:deferred     Assessment:    Pregnancy: [id number]  1. Supervision of high risk pregnancy, antepartum BP normal on repeat  Viability scan today   2. History of IUFD 2021 at 21 wks  3. History of C-section 2023 at request of pt after laboring for 30 hours on magnesium  Desires VBAC   4. History of severe pre-eclampsia Discussed  recommendation of ASA during pregnancy, rx sent to pharmacy for ASA 162mg  to start at 12 weeks   5. History of gestational diabetes Labs today, discussed early GTT   6. [redacted] weeks gestation of pregnancy Initial labs drawn. Prenatal vitamins. Problem list reviewed and updated. Reviewed in detail the nature of the practice with collaborative care between  Genetic screening discussed: NIPS/First trimester screen/Quad/AFP ordered. Role of ultrasound in pregnancy discussed; Anatomy US: ordered. - CBC/D/Plt+RPR+Rh+ABO+RubIgG... - Culture, OB Urine - GC/Chlamydia probe amp ([place])not at [facility] - Hemoglobin A1c - PANORAMA PRENATAL TEST - Protein / creatinine ratio, urine - Comp Met (CMET)  - aspirin EC 81 MG tablet; Take 2 tablets (162 mg total) by mouth daily. Start taking when you are [redacted] weeks pregnant for rest of pregnancy for prevention of preeclampsia  Dispense: 300 tablet; Refill: 2  Follow up in 4 weeks. Discussed clinic routines, schedule of care and testing, genetic screening options, involvement of students and residents under the direct supervision of APPs and doctors and presence of female providers. Pt verbalized understanding.  Future Appointments  Date Time Provider Department Center  12/17/2022 11:45 AM [facility] US2 [facility]  02/18/2023  9:15 AM [facility] NURSE [facility]  02/18/2023  9:30 AM [facility] US2 [facility]     Sue Lush, FNP

## 2022-12-18 ENCOUNTER — Encounter: Payer: Self-pay | Admitting: Obstetrics and Gynecology

## 2022-12-18 ENCOUNTER — Encounter: Payer: Self-pay | Admitting: *Deleted

## 2022-12-18 DIAGNOSIS — O99810 Abnormal glucose complicating pregnancy: Secondary | ICD-10-CM | POA: Insufficient documentation

## 2022-12-18 LAB — CBC/D/PLT+RPR+RH+ABO+RUBIGG...
Antibody Screen: NEGATIVE
Basophils Absolute: 0 10*3/uL (ref 0.0–0.2)
Basos: 0 %
EOS (ABSOLUTE): 0.1 10*3/uL (ref 0.0–0.4)
Eos: 1 %
HCV Ab: NONREACTIVE
HIV Screen 4th Generation wRfx: NONREACTIVE
Hematocrit: 36.9 % (ref 34.0–46.6)
Hemoglobin: 12.2 g/dL (ref 11.1–15.9)
Hepatitis B Surface Ag: NEGATIVE
Immature Grans (Abs): 0 10*3/uL (ref 0.0–0.1)
Immature Granulocytes: 0 %
Lymphocytes Absolute: 1.5 10*3/uL (ref 0.7–3.1)
Lymphs: 21 %
MCH: 26.3 pg — ABNORMAL LOW (ref 26.6–33.0)
MCHC: 33.1 g/dL (ref 31.5–35.7)
MCV: 80 fL (ref 79–97)
Monocytes Absolute: 0.5 10*3/uL (ref 0.1–0.9)
Monocytes: 7 %
Neutrophils Absolute: 4.9 10*3/uL (ref 1.4–7.0)
Neutrophils: 71 %
Platelets: 262 10*3/uL (ref 150–450)
RBC: 4.64 x10E6/uL (ref 3.77–5.28)
RDW: 13.4 % (ref 11.7–15.4)
RPR Ser Ql: NONREACTIVE
Rh Factor: POSITIVE
Rubella Antibodies, IGG: 2.3 {index} (ref >0.99)
WBC: 6.9 10*3/uL (ref 3.4–10.8)

## 2022-12-18 LAB — HEMOGLOBIN A1C
Est. average glucose Bld gHb Est-mCnc: 128 mg/dL
Hgb A1c MFr Bld: 6.1 % — ABNORMAL HIGH (ref 4.8–5.6)

## 2022-12-18 LAB — GC/CHLAMYDIA PROBE AMP (~~LOC~~) NOT AT ARMC
Chlamydia: NEGATIVE
Comment: NEGATIVE
Comment: NORMAL
Neisseria Gonorrhea: NEGATIVE

## 2022-12-18 LAB — COMPREHENSIVE METABOLIC PANEL
ALT: 16 [IU]/L (ref 0–32)
AST: 17 [IU]/L (ref 0–40)
Albumin: 4.3 g/dL (ref 3.9–4.9)
Alkaline Phosphatase: 52 [IU]/L (ref 44–121)
BUN/Creatinine Ratio: 10 (ref 9–23)
BUN: 7 mg/dL (ref 6–20)
Bilirubin Total: 0.3 mg/dL (ref 0.0–1.2)
CO2: 18 mmol/L — ABNORMAL LOW (ref 20–29)
Calcium: 9.6 mg/dL (ref 8.7–10.2)
Chloride: 103 mmol/L (ref 96–106)
Creatinine, Ser: 0.68 mg/dL (ref 0.57–1.00)
Globulin, Total: 2.3 g/dL (ref 1.5–4.5)
Glucose: 100 mg/dL — ABNORMAL HIGH (ref 70–99)
Potassium: 4.2 mmol/L (ref 3.5–5.2)
Sodium: 137 mmol/L (ref 134–144)
Total Protein: 6.6 g/dL (ref 6.0–8.5)
eGFR: 117 mL/min/{1.73_m2} (ref >59)

## 2022-12-18 LAB — HCV INTERPRETATION

## 2022-12-18 LAB — PROTEIN / CREATININE RATIO, URINE
Creatinine, Urine: 194.6 mg/dL
Protein, Ur: 14.9 mg/dL
Protein/Creat Ratio: 77 mg/g{creat} (ref 0–200)

## 2022-12-19 LAB — URINE CULTURE, OB REFLEX

## 2022-12-19 LAB — CULTURE, OB URINE

## 2022-12-22 LAB — PANORAMA PRENATAL TEST FULL PANEL:PANORAMA TEST PLUS 5 ADDITIONAL MICRODELETIONS: FETAL FRACTION: 5.2
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
## 2023-01-14 ENCOUNTER — Other Ambulatory Visit: Payer: Self-pay

## 2023-01-14 DIAGNOSIS — O99810 Abnormal glucose complicating pregnancy: Secondary | ICD-10-CM

## 2023-01-15 ENCOUNTER — Telehealth: Payer: Self-pay

## 2023-01-15 ENCOUNTER — Other Ambulatory Visit: Payer: Self-pay

## 2023-01-15 ENCOUNTER — Ambulatory Visit (INDEPENDENT_AMBULATORY_CARE_PROVIDER_SITE_OTHER): Payer: Medicaid Other | Admitting: Family Medicine

## 2023-01-15 ENCOUNTER — Other Ambulatory Visit: Payer: Medicaid Other

## 2023-01-15 VITALS — BP 112/78 | HR 114 | Wt 282.0 lb

## 2023-01-15 DIAGNOSIS — O99810 Abnormal glucose complicating pregnancy: Secondary | ICD-10-CM

## 2023-01-15 DIAGNOSIS — O24429 Gestational diabetes mellitus in childbirth, unspecified control: Secondary | ICD-10-CM

## 2023-01-15 DIAGNOSIS — D62 Acute posthemorrhagic anemia: Secondary | ICD-10-CM | POA: Diagnosis not present

## 2023-01-15 DIAGNOSIS — O0990 Supervision of high risk pregnancy, unspecified, unspecified trimester: Secondary | ICD-10-CM

## 2023-01-15 DIAGNOSIS — Z8632 Personal history of gestational diabetes: Secondary | ICD-10-CM

## 2023-01-15 DIAGNOSIS — Z8759 Personal history of other complications of pregnancy, childbirth and the puerperium: Secondary | ICD-10-CM

## 2023-01-15 DIAGNOSIS — Z3A14 14 weeks gestation of pregnancy: Secondary | ICD-10-CM

## 2023-01-15 DIAGNOSIS — Z98891 History of uterine scar from previous surgery: Secondary | ICD-10-CM

## 2023-01-15 DIAGNOSIS — O0992 Supervision of high risk pregnancy, unspecified, second trimester: Secondary | ICD-10-CM

## 2023-01-15 NOTE — Progress Notes (Addendum)
   PRENATAL VISIT NOTE  Subjective:  Alicia Terry is a 32 y.o. [phone] at [redacted]w[redacted]d being seen today for ongoing prenatal care.  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  Patient reports no complaints.  Contractions: Not present. Vag. Bleeding: None.  Movement: Absent. Denies leaking of fluid.   The following portions of the patient's history were reviewed and updated as appropriate: allergies, current medications, past family history, past medical history, past social history, past surgical history and problem list.   Objective:   Vitals:   01/15/23 0905  BP: 112/78  Pulse: (!) 114  Weight: 282 lb (127.9 kg)    Fetal Status: Fetal Heart Rate (bpm): 157   Movement: Absent     General:  Alert, oriented and cooperative. Patient is in no acute distress.  Skin: Skin is warm and dry. No rash noted.   Cardiovascular: Normal heart rate noted  Respiratory: Normal respiratory effort, no problems with respiration noted  Abdomen: Soft, gravid, appropriate for gestational age.  Pain/Pressure: Absent     Pelvic: Cervical exam deferred        Extremities: Normal range of motion.  Edema: None  Mental Status: Normal mood and affect. Normal behavior. Normal judgment and thought content.   Assessment and Plan:  Pregnancy: [id number] at [redacted]w[redacted]d 1. Supervision of high risk pregnancy, antepartum - Doing early GTT today, initial A1C 6.1  2. 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 History of IUFD - 2021 at 21 wks  4. History of C-section - Desires VBAC, previous pregnancy in 2023 was at request after laboring for 30 hrs on magnesium  5. History of severe pre-eclampsia - Patient says  she hasn't stated the aspirin, agrees to start taking ASA 162mg  today  6. History of gestational diabetes  7. [redacted] weeks gestation of pregnancy - Follow up in 4 wks  Preterm labor symptoms and general obstetric precautions including but not limited to vaginal bleeding, contractions, leaking of fluid and fetal movement were reviewed in detail with the patient. Please refer to After Visit Summary for other counseling recommendations.   No follow-ups on file.  Future Appointments  Date Time Provider Department Center  02/18/2023  9:15 AM [facility] NURSE [facility]  02/18/2023  9:30 AM [facility] US2 [facility]    Dyke Maes, PA Student

## 2023-01-15 NOTE — Telephone Encounter (Signed)
Called to inquire if patient would be interested in being a model for ultrasound teaching for practice providers. Pt unable to participate due to no childcare.

## 2023-01-16 ENCOUNTER — Telehealth: Payer: Self-pay | Admitting: *Deleted

## 2023-01-16 DIAGNOSIS — O24419 Gestational diabetes mellitus in pregnancy, unspecified control: Secondary | ICD-10-CM

## 2023-01-16 LAB — GLUCOSE TOLERANCE, 2 HOURS W/ 1HR
Glucose, 1 hour: 214 mg/dL — ABNORMAL HIGH (ref 70–179)
Glucose, 2 hour: 118 mg/dL (ref 70–152)
Glucose, Fasting: 110 mg/dL — ABNORMAL HIGH (ref 70–91)

## 2023-01-16 LAB — CBC
Hematocrit: 35.8 % (ref 34.0–46.6)
Hemoglobin: 11.7 g/dL (ref 11.1–15.9)
MCH: 26.7 pg (ref 26.6–33.0)
MCHC: 32.7 g/dL (ref 31.5–35.7)
MCV: 82 fL (ref 79–97)
Platelets: 259 10*3/uL (ref 150–450)
RBC: 4.39 x10E6/uL (ref 3.77–5.28)
RDW: 13.4 % (ref 11.7–15.4)
WBC: 7.2 10*3/uL (ref 3.4–10.8)

## 2023-01-16 MED ORDER — ACCU-CHEK GUIDE TEST VI STRP
ORAL_STRIP | 12 refills | Status: AC
Start: 2023-01-16 — End: ?

## 2023-01-16 MED ORDER — ACCU-CHEK GUIDE W/DEVICE KIT
1.0000 | PACK | Freq: Once | 0 refills | Status: AC
Start: 2023-01-16 — End: 2023-01-16

## 2023-01-16 MED ORDER — ACCU-CHEK SOFTCLIX LANCETS MISC
12 refills | Status: DC
Start: 2023-01-16 — End: 2023-06-24

## 2023-01-16 NOTE — Telephone Encounter (Signed)
Patient called back but hung up before nurse could talk with her. I called [place] back and left a message I was returning her call and could not answer immediately because I was on another line, but to please call back. Nancy Fetter

## 2023-01-16 NOTE — Telephone Encounter (Signed)
I scheduled first available diabetes education on 01/31/23 at 1:15 in our office. I sent in referral order. I called [name] and left message I am calling with results and appointment we have scheduled, please call office to discuss. I also sent in diabetes supplies to pharmacy and patient needs to be informed to take to appointment. Alicia Terry

## 2023-01-16 NOTE — Telephone Encounter (Signed)
Alicia Terry called back and I informed her of results, recommendation to see Diabetes educator and appointment. She states she had GDM before and doesn't think she needs it I informed her yes she has a lot of knowledge but we still recommend she attend and she agreed to appointment. We discussed since she knows how to use meter I have sent rx to pharmacy and she can go ahead and pick up meter and start checking her Cbg's and send to [place] weekly if she would like by MyChart.  Nancy Fetter

## 2023-01-16 NOTE — Telephone Encounter (Signed)
-----   Message from Celedonio Savage sent at 01/16/2023  8:17 AM EST ----- Hey this patient failed her early GTT, can we get her testing supplies and scheduled with diabetic coordinator.   Thanks!   Alecia Lemming ----- Message ----- From: Nell Range Lab Results In Sent: 01/16/2023   4:36 AM EST To: Celedonio Savage, MD

## 2023-01-25 ENCOUNTER — Encounter: Payer: Self-pay | Admitting: Nurse Practitioner

## 2023-01-25 NOTE — Telephone Encounter (Signed)
 Care team updated and letter sent for eye exam notes.

## 2023-01-31 ENCOUNTER — Encounter: Payer: Medicaid Other | Attending: Advanced Practice Midwife | Admitting: Dietician

## 2023-01-31 ENCOUNTER — Ambulatory Visit: Payer: Medicaid Other | Admitting: Dietician

## 2023-01-31 ENCOUNTER — Other Ambulatory Visit: Payer: Self-pay

## 2023-01-31 DIAGNOSIS — Z3A00 Weeks of gestation of pregnancy not specified: Secondary | ICD-10-CM | POA: Diagnosis not present

## 2023-01-31 DIAGNOSIS — Z713 Dietary counseling and surveillance: Secondary | ICD-10-CM | POA: Insufficient documentation

## 2023-01-31 DIAGNOSIS — O99810 Abnormal glucose complicating pregnancy: Secondary | ICD-10-CM

## 2023-01-31 DIAGNOSIS — Z3A16 16 weeks gestation of pregnancy: Secondary | ICD-10-CM

## 2023-01-31 DIAGNOSIS — O24419 Gestational diabetes mellitus in pregnancy, unspecified control: Secondary | ICD-10-CM | POA: Insufficient documentation

## 2023-01-31 NOTE — Progress Notes (Signed)
Patient was seen for Gestational Diabetes self-management on 01/31/2023   Start time 1315 and End time 1415   Estimated due date: 07/12/2023; [redacted]w[redacted]d   Clinical: Medications:  Current Outpatient Medications:    Accu-Chek Softclix Lancets lancets, Use four times per day as instructed, Disp: 100 each, Rfl: 12   aspirin EC 81 MG tablet, Take 2 tablets (162 mg total) by mouth daily. 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 Pt reports she does the shopping and the cooking and reports eating out six times weekly. Pt reports she has been testing her blood sugar for the past three days using an accu chek meter. Pt reports she has been testing before breakfast and 1- 2 hours after meals. Pt denies keeping a log of blood sugars and states she does not have her meter with her today. Pt reports monitoring blood sugar at fasting with recent values ranging "90-110" mg/dL. Pt reports after meals value ranging "120-130" mg/dL. Pt reports she has spoken with the pharmacy and strips are ready for pick up. Pt reports previous GDM and denies the need for blood sugar lowering medications during that pregnancy. Pt reports she is meal prepping currently.  Pt reports she avoids  pork and discontinued juice intake recently for this pregnancy. All Pt's questions were answered during this encounter.  Physical Activity: 1 hour 3 days weekly with a personal trainer weights, walking  Stress: 0-1 out of 10 / self care: gym, read Sleep: good  24 hr Recall:  First Meal: eggs, [place] sausage, kiwi, water Snack: none  Second meal: skips or chicken tenders, fried from [employer] or salad from [employer] or [place] or [place], cheese, avocado mayo, tomato on 2 slices of honey wheat bread, water Snack: almond granola bar  Third meal: backed chicken, broccoli, rice or zucchini, squash, chicken or steak, sweet potatoes  Snack: none  Beverages: water, sweet tea three times weekly, SF green tea  NUTRITION INTERVENTION  Nutrition education (E-1) on the following topics:   Initial Follow-up  [x]  []  Definition of Gestational Diabetes [x]  []  Why dietary management is important in controlling blood glucose [x]  []  Effects each nutrient has on blood glucose levels [x]  []  Simple carbohydrates vs complex carbohydrates [x]  []  Fluid intake [x]  []  Creating a balanced meal plan [x]  []  Carbohydrate counting  [x]  []  When to check blood glucose levels [x]  []  Proper blood glucose monitoring techniques [x]  []  Effect of stress and stress reduction techniques  [x]  []  Exercise effect on blood glucose levels, appropriate exercise during pregnancy [x]  []  Importance of limiting caffeine and abstaining from alcohol and smoking [x]  []  Medications  used for blood sugar control during pregnancy [x]  []  Hypoglycemia and rule of 15 [x]  []  Postpartum self care   CBG: 143 mg/dL, reported 2 hour post prandial   Patient has a meter prior to visit, however did not bring with her to this visit.   Patient instructed to monitor glucose levels: QID FBS: 60 - <= 95 mg/dL; 2 hour: <= 010 mg/dL  Patient received handouts: Nutrition Diabetes and Pregnancy Carbohydrate Counting List Blood glucose log Snack  ideas for diabetes during pregnancy  Patient will be seen for follow-up as needed.

## 2023-02-06 DIAGNOSIS — Z98891 History of uterine scar from previous surgery: Secondary | ICD-10-CM

## 2023-02-06 HISTORY — DX: History of uterine scar from previous surgery: Z98.891

## 2023-02-12 ENCOUNTER — Other Ambulatory Visit: Payer: Self-pay

## 2023-02-12 ENCOUNTER — Ambulatory Visit (INDEPENDENT_AMBULATORY_CARE_PROVIDER_SITE_OTHER): Payer: Medicaid Other | Admitting: Obstetrics and Gynecology

## 2023-02-12 ENCOUNTER — Encounter: Payer: Self-pay | Admitting: Obstetrics and Gynecology

## 2023-02-12 VITALS — BP 121/82 | HR 105 | Wt 283.4 lb

## 2023-02-12 DIAGNOSIS — Z98891 History of uterine scar from previous surgery: Secondary | ICD-10-CM

## 2023-02-12 DIAGNOSIS — O99210 Obesity complicating pregnancy, unspecified trimester: Secondary | ICD-10-CM

## 2023-02-12 DIAGNOSIS — Z8759 Personal history of other complications of pregnancy, childbirth and the puerperium: Secondary | ICD-10-CM

## 2023-02-12 DIAGNOSIS — O24419 Gestational diabetes mellitus in pregnancy, unspecified control: Secondary | ICD-10-CM

## 2023-02-12 DIAGNOSIS — Z3A18 18 weeks gestation of pregnancy: Secondary | ICD-10-CM

## 2023-02-12 DIAGNOSIS — O99212 Obesity complicating pregnancy, second trimester: Secondary | ICD-10-CM

## 2023-02-12 DIAGNOSIS — O0990 Supervision of high risk pregnancy, unspecified, unspecified trimester: Secondary | ICD-10-CM | POA: Diagnosis not present

## 2023-02-12 DIAGNOSIS — O0992 Supervision of high risk pregnancy, unspecified, second trimester: Secondary | ICD-10-CM

## 2023-02-12 DIAGNOSIS — Z6841 Body Mass Index (BMI) 40.0 and over, adult: Secondary | ICD-10-CM

## 2023-02-12 NOTE — Progress Notes (Signed)
   PRENATAL VISIT NOTE  Subjective:  Alicia Terry is a 33 y.o. [phone] at [redacted]w[redacted]d being seen today for ongoing prenatal care.  She is currently monitored for the following issues for this high-risk pregnancy and has History of IUFD; Obesity in pregnancy; Alpha thalassemia silent carrier; early GDM; BMI 40.0-44.9, adult (HCC); History of severe pre-eclampsia; History of cesarean delivery; and Supervision of high risk pregnancy, antepartum on their problem list.  Patient reports no complaints.  Contractions: Not present. Vag. Bleeding: None.  Movement: Absent. Denies leaking of fluid.   The following portions of the patient's history were reviewed and updated as appropriate: allergies, current medications, past family history, past medical history, past social history, past surgical history and problem list.   Objective:   Vitals:   02/12/23 1329  BP: 121/82  Pulse: (!) 105  Weight: 283 lb 6 oz (128.5 kg)    Fetal Status: Fetal Heart Rate (bpm): 154   Movement: Absent     General:  Alert, oriented and cooperative. Patient is in no acute distress.  Skin: Skin is warm and dry. No rash noted.   Cardiovascular: Normal heart rate noted  Respiratory: Normal respiratory effort, no problems with respiration noted  Abdomen: Soft, gravid, appropriate for gestational age.  Pain/Pressure: Absent     Pelvic: Cervical exam deferred        Extremities: Normal range of motion.  Edema: None  Mental Status: Normal mood and affect. Normal behavior. Normal judgment and thought content.   Assessment and Plan:  Pregnancy: [id number] at [redacted]w[redacted]d 1. Supervision of high risk pregnancy, antepartum (Primary) - AFP, Serum, Open Spina Bifida  2. History of severe pre-eclampsia Confirms on ASA - AFP, Serum, Open Spina Bifida  3. Obesity in pregnancy Weight stable  4. BMI 40.0-44.9, adult (HCC)  5. Gestational diabetes mellitus (GDM) in second trimester, gestational diabetes method of control  unspecified Diagnosed early. Pt finally got meter after having issues with it not working. Normal 2h post prandial numbers but high 90s to 100 for AM fasting. Continue to follow. Okay to stay off meds for now  6. History of cesarean delivery 01/2022 PLTC FITL @ 4cm & 36/6 PPROM  7. History of IUFD 2021 after presenting for LOF, VB at 20-21 weeks.   Preterm labor symptoms and general obstetric precautions including but not limited to vaginal bleeding, contractions, leaking of fluid and fetal movement were reviewed in detail with the patient. Please refer to After Visit Summary for other counseling recommendations.   Return in 10 days (on 02/22/2023) for high risk ob, md visit, in person.  Future Appointments  Date Time Provider Department Center  02/22/2023  1:15 PM [facility] NURSE [facility]  02/22/2023  1:30 PM [facility] US3 [facility]  02/25/2023 10:15 AM Cresenzo, Norleen GAILS, MD [facility]    Bebe Furry, MD

## 2023-02-14 LAB — AFP, SERUM, OPEN SPINA BIFIDA
AFP MoM: 0.66
AFP Value: 21.7 ng/mL
Gest. Age on Collection Date: 18 wk
Maternal Age At EDD: 34.8 a
OSBR Risk 1 IN: 10000
Test Results:: NEGATIVE
Weight: 283 [lb_av]

## 2023-02-18 ENCOUNTER — Ambulatory Visit: Payer: Medicaid Other

## 2023-02-22 ENCOUNTER — Ambulatory Visit: Payer: Medicaid Other

## 2023-02-25 ENCOUNTER — Other Ambulatory Visit: Payer: Self-pay

## 2023-02-25 ENCOUNTER — Ambulatory Visit (INDEPENDENT_AMBULATORY_CARE_PROVIDER_SITE_OTHER): Payer: Medicaid Other | Admitting: Family Medicine

## 2023-02-25 VITALS — BP 126/78 | HR 95 | Wt 287.0 lb

## 2023-02-25 DIAGNOSIS — O24419 Gestational diabetes mellitus in pregnancy, unspecified control: Secondary | ICD-10-CM

## 2023-02-25 DIAGNOSIS — Z6841 Body Mass Index (BMI) 40.0 and over, adult: Secondary | ICD-10-CM

## 2023-02-25 DIAGNOSIS — Z3A20 20 weeks gestation of pregnancy: Secondary | ICD-10-CM

## 2023-02-25 DIAGNOSIS — Z98891 History of uterine scar from previous surgery: Secondary | ICD-10-CM

## 2023-02-25 DIAGNOSIS — O0990 Supervision of high risk pregnancy, unspecified, unspecified trimester: Secondary | ICD-10-CM

## 2023-02-25 DIAGNOSIS — Z3009 Encounter for other general counseling and advice on contraception: Secondary | ICD-10-CM

## 2023-02-25 DIAGNOSIS — O99210 Obesity complicating pregnancy, unspecified trimester: Secondary | ICD-10-CM

## 2023-02-25 DIAGNOSIS — Z8759 Personal history of other complications of pregnancy, childbirth and the puerperium: Secondary | ICD-10-CM

## 2023-02-25 NOTE — Progress Notes (Signed)
   PRENATAL VISIT NOTE  Subjective:  Alicia Terry is a 33 y.o. [phone] at [redacted]w[redacted]d being seen today for ongoing prenatal care.  She is currently monitored for the following issues for this high-risk pregnancy and has History of IUFD; Obesity in pregnancy; Alpha thalassemia silent carrier; early GDM; BMI 40.0-44.9, adult (HCC); History of severe pre-eclampsia; History of cesarean delivery; and Supervision of high risk pregnancy, antepartum on their problem list.  Patient reports no bleeding, no contractions, no cramping, and no leaking.  Contractions: Not present. Vag. Bleeding: None.  Movement: Present. Denies leaking of fluid.   The following portions of the patient's history were reviewed and updated as appropriate: allergies, current medications, past family history, past medical history, past social history, past surgical history and problem list.   Objective:   Vitals:   02/25/23 1035  BP: 126/78  Pulse: 95  Weight: 287 lb (130.2 kg)    Fetal Status: Fetal Heart Rate (bpm): 158   Movement: Present     General:  Alert, oriented and cooperative. Patient is in no acute distress.  Skin: Skin is warm and dry. No rash noted.   Cardiovascular: Normal heart rate noted  Respiratory: Normal respiratory effort, no problems with respiration noted  Abdomen: Soft, gravid, appropriate for gestational age.  Pain/Pressure: Absent     Pelvic: Cervical exam deferred        Extremities: Normal range of motion.  Edema: None  Mental Status: Normal mood and affect. Normal behavior. Normal judgment and thought content.   Assessment and Plan:  Pregnancy: [name] at [redacted]w[redacted]d 1. Supervision of high risk pregnancy, antepartum (Primary) FHR is appropriate today BP appropriate today  2. History of severe pre-eclampsia BP appropriate today, no signs of preeclampsia  3. Obesity in pregnancy  4. BMI 40.0-44.9, adult (HCC)  5. Gestational diabetes mellitus (GDM) in second trimester, gestational  diabetes method of control unspecified Blood glucoses elevated over the last few mornings.  Patient reports she has been eating a lot more Posta this week.  Will not initiate metformin at this time the patient will decrease Posta intake.  16/57 blood sugars elevated.  6. History of cesarean delivery Prior C-section for fetal intolerance to labor at 4 cm Desires trial of labor  7. History of IUFD At approximately 20 weeks  8. [redacted] weeks gestation of pregnancy  9. Unwanted fertility Desires BTL    Preterm labor symptoms and general obstetric precautions including but not limited to vaginal bleeding, contractions, leaking of fluid and fetal movement were reviewed in detail with the patient. Please refer to After Visit Summary for other counseling recommendations.   No follow-ups on file.  Future Appointments  Date Time Provider Department Center  02/28/2023  9:15 AM [facility] NURSE [facility]  02/28/2023  9:30 AM [facility] US2 [facility]  03/25/2023  2:35 PM Adam Phenix, MD [facility]    Celedonio Savage, MD

## 2023-02-28 ENCOUNTER — Ambulatory Visit: Payer: Medicaid Other | Attending: Obstetrics and Gynecology

## 2023-02-28 ENCOUNTER — Other Ambulatory Visit: Payer: Self-pay | Admitting: *Deleted

## 2023-02-28 ENCOUNTER — Other Ambulatory Visit: Payer: Self-pay

## 2023-02-28 ENCOUNTER — Ambulatory Visit (HOSPITAL_BASED_OUTPATIENT_CLINIC_OR_DEPARTMENT_OTHER): Payer: Medicaid Other

## 2023-02-28 ENCOUNTER — Encounter: Payer: Self-pay | Admitting: *Deleted

## 2023-02-28 ENCOUNTER — Ambulatory Visit: Payer: Medicaid Other | Admitting: *Deleted

## 2023-02-28 VITALS — BP 130/60 | HR 101

## 2023-02-28 DIAGNOSIS — O0990 Supervision of high risk pregnancy, unspecified, unspecified trimester: Secondary | ICD-10-CM | POA: Insufficient documentation

## 2023-02-28 DIAGNOSIS — E669 Obesity, unspecified: Secondary | ICD-10-CM

## 2023-02-28 DIAGNOSIS — Z98891 History of uterine scar from previous surgery: Secondary | ICD-10-CM | POA: Diagnosis not present

## 2023-02-28 DIAGNOSIS — O09292 Supervision of pregnancy with other poor reproductive or obstetric history, second trimester: Secondary | ICD-10-CM | POA: Diagnosis not present

## 2023-02-28 DIAGNOSIS — O24410 Gestational diabetes mellitus in pregnancy, diet controlled: Secondary | ICD-10-CM | POA: Diagnosis not present

## 2023-02-28 DIAGNOSIS — Z8759 Personal history of other complications of pregnancy, childbirth and the puerperium: Secondary | ICD-10-CM

## 2023-02-28 DIAGNOSIS — Z3A20 20 weeks gestation of pregnancy: Secondary | ICD-10-CM

## 2023-02-28 DIAGNOSIS — D563 Thalassemia minor: Secondary | ICD-10-CM | POA: Diagnosis not present

## 2023-02-28 DIAGNOSIS — O99212 Obesity complicating pregnancy, second trimester: Secondary | ICD-10-CM

## 2023-02-28 DIAGNOSIS — O99012 Anemia complicating pregnancy, second trimester: Secondary | ICD-10-CM

## 2023-02-28 DIAGNOSIS — O34219 Maternal care for unspecified type scar from previous cesarean delivery: Secondary | ICD-10-CM | POA: Diagnosis not present

## 2023-02-28 NOTE — Progress Notes (Signed)
Patient information  Patient Name: Alicia Terry  Patient MRN:   [id number]  Referring practice: MFM Referring Provider: [facility])  MFM CONSULT  [name] is a 33 y.o. [id number] at [redacted]w[redacted]d here for ultrasound and consultation. Patient Active Problem List   Diagnosis Date Noted   Supervision of high risk pregnancy, antepartum 12/11/2022   History of cesarean delivery 01/23/2022   History of severe pre-eclampsia 01/05/2022   BMI 40.0-44.9, adult (HCC) 08/28/2021   early GDM 08/21/2021   Alpha thalassemia silent carrier 08/18/2021   Obesity in pregnancy 08/05/2020   History of IUFD 07/30/2020    Alicia Terry is doing well today with no acute concerns. She denies contractions, bleeding, or loss of fluid and reports good fetal movement.   RE history of IUFD at 21 weeks: Patient has a history of fetal demise around 21 weeks.  She had P PROM and bleeding and came to the MAU and there was no heartbeat.  She underwent a successful induction.  There is no evidence of hematoma or infection on the placenta.  I do not see where she had antiphospholipid testing.  She did not require progesterone therapy in her next pregnancy and carried to term.  Her cervical length is normal today.  I discussed the need for antenatal testing and close surveillance for this pregnancy due to her history of demise.  RE early onset gestational diabetes: Patient was diagnosed with early onset gestational diabetes.  Currently her fasting blood sugar ranges between 85 and 101.  Her 2-hour postprandial blood sugars range between 103-121.  Per her report approximately 25% of her values are not well-controlled.  She is going to continue to adjust her diet.  I discussed the potential complications of uncontrolled diabetes in pregnancy including but not limited to growth abnormalities, stillbirth, congenital abnormalities and need for early delivery.  There are also potential  postdelivery consequences such as poor respiratory function and inability to control blood sugar and body temperature requiring an ICU attention.  A fetal echo will be arranged due to her early onset diabetes.  She had a fetal echo and a previous pregnancy for "type 2 diabetes" but the patient reports it was because of a "family history of congential heart disease".   RE history of cesarean delivery: Patient had a C-section in her previous pregnancy.  She has been counseled by her OB provider and is likely going to have a repeat C-section but is open to a vaginal birth if she goes into spontaneous labor at the appropriate time.  We also discussed the estimated fetal weight at the time of delivery will be an important factor in making this decision.  RE history of preeclampsia: Patient has a history of preeclampsia at term.  She has been compliant with aspirin.  She is aware of the signs and symptoms and the need to monitor her blood pressure as well as her symptoms.  Sonographic findings Single intrauterine pregnancy at 20w 5d  Fetal cardiac activity:  Observed and appears normal. Presentation: Transverse, head to maternal left. The anatomic structures that were well seen appear normal without evidence of soft markers. Due to poor acoustic windows some structures remain suboptimally visualized. Fetal biometry shows the estimated fetal weight at the 78 percentile.  Amniotic fluid: Within normal limits.  MVP: 7.98 cm. Placenta: Anterior. Adnexa: No abnormality visualized. Cervical length: 3.7 cm.  There are limitations of prenatal ultrasound such as the inability to detect  certain abnormalities due to poor visualization. Various factors such as fetal position, gestational age and maternal body habitus may increase the difficulty in visualizing the fetal anatomy.    Recommendations -EDD is Estimated Date of Delivery: 07/12/23 based on [redacted]w[redacted]d [place]. -Detailed ultrasound was done today without  abnormalities but is limited due to poor acoustic windows. -Baseline preeclampsia labs: CMP, CBC and urine protein/creatinine ratio if not previously completed.  -APS testing should be done if not previously completed due to the history of fetal demise -Continue diabetic care with OB provider.  Discussed blood sugar goals.  Start medication if greater than 50% of her glucose values are not well-controlled. -Continue Aspirin 81 mg for preeclampsia prophylaxis -Follow-up anatomy and fetal growth in 4 to 6 weeks -Serial growth ultrasounds starting around 28 weeks to monitor for fetal growth restriction -Fetal echo has been arranged at [facility].  -Antenatal testing to start around 28-32 weeks pending her glucose control due to the increased risk of stillbirth and high risk pregnancy -Delivery timing pending clinical course but likely around 37-38 weeks gestion -Continue routine prenatal care with referring OB provider  Review of Systems: A review of systems was performed and was negative except per HPI   Vitals and Physical Exam    02/28/2023    9:30 AM 02/25/2023   10:35 AM 02/12/2023    1:29 PM  Vitals with BMI  Weight  287 lbs 283 lbs 6 oz  Systolic 130 126 784  Diastolic 60 78 82  Pulse 101 95 105  Sitting comfortably on the sonogram table Nonlabored breathing Normal rate and rhythm Abdomen is nontender  Past pregnancies OB History  Gravida Para Term Preterm AB Living  4 2 1 1 1 1   SAB IAB Ectopic Multiple Live Births  0 0 1 0 1    # Outcome Date GA Lbr Len/2nd Weight Sex Type Anes PTL Lv  4 Current           3 Term 01/23/22 [redacted]w[redacted]d / 05:16 9 lb 2 oz (4.14 kg) M CS-LTranv EPI  LIV  2 Preterm 07/21/19 [redacted]w[redacted]d   F Vag-Spont None  FD  1 Ectopic              I spent 60 minutes reviewing the patients chart, including labs and images as well as counseling the patient about her medical conditions. Greater than 50% of the time was spent in direct face-to-face patient counseling.  Braxton Feathers  MFM, [facility]   02/28/2023  10:25 AM

## 2023-03-25 ENCOUNTER — Other Ambulatory Visit: Payer: Self-pay

## 2023-03-25 ENCOUNTER — Ambulatory Visit: Payer: Medicaid Other | Admitting: Obstetrics & Gynecology

## 2023-03-25 VITALS — BP 114/74 | HR 99 | Wt 289.0 lb

## 2023-03-25 DIAGNOSIS — O99212 Obesity complicating pregnancy, second trimester: Secondary | ICD-10-CM | POA: Diagnosis not present

## 2023-03-25 DIAGNOSIS — O24419 Gestational diabetes mellitus in pregnancy, unspecified control: Secondary | ICD-10-CM

## 2023-03-25 DIAGNOSIS — Z98891 History of uterine scar from previous surgery: Secondary | ICD-10-CM | POA: Diagnosis not present

## 2023-03-25 DIAGNOSIS — Z3A24 24 weeks gestation of pregnancy: Secondary | ICD-10-CM

## 2023-03-25 DIAGNOSIS — O0990 Supervision of high risk pregnancy, unspecified, unspecified trimester: Secondary | ICD-10-CM

## 2023-03-25 DIAGNOSIS — Z8759 Personal history of other complications of pregnancy, childbirth and the puerperium: Secondary | ICD-10-CM

## 2023-03-25 DIAGNOSIS — O0992 Supervision of high risk pregnancy, unspecified, second trimester: Secondary | ICD-10-CM | POA: Diagnosis not present

## 2023-03-25 DIAGNOSIS — O24112 Pre-existing diabetes mellitus, type 2, in pregnancy, second trimester: Secondary | ICD-10-CM | POA: Diagnosis not present

## 2023-03-25 DIAGNOSIS — Q210 Ventricular septal defect: Secondary | ICD-10-CM | POA: Diagnosis not present

## 2023-03-25 DIAGNOSIS — O24410 Gestational diabetes mellitus in pregnancy, diet controlled: Secondary | ICD-10-CM | POA: Diagnosis not present

## 2023-03-25 MED ORDER — METFORMIN HCL 500 MG PO TABS: 500.0000 mg | ORAL_TABLET | Freq: Two times a day (BID) | ORAL | 5 refills | Status: AC

## 2023-03-25 NOTE — Progress Notes (Signed)
   PRENATAL VISIT NOTE  Subjective:  Alicia Terry is a 33 y.o. [phone] at [redacted]w[redacted]d being seen today for ongoing prenatal care.  She is currently monitored for the following issues for this high-risk pregnancy and has History of IUFD; Obesity in pregnancy; Alpha thalassemia silent carrier; early GDM; BMI 40.0-44.9, adult (HCC); History of severe pre-eclampsia; History of cesarean delivery; and Supervision of high risk pregnancy, antepartum on their problem list.  Patient reports no complaints.  Contractions: Not present. Vag. Bleeding: None.  Movement: Present. Denies leaking of fluid.   The following portions of the patient's history were reviewed and updated as appropriate: allergies, current medications, past family history, past medical history, past social history, past surgical history and problem list.   Objective:   Vitals:   03/25/23 1438  BP: 114/74  Pulse: 99  Weight: 289 lb (131.1 kg)    Fetal Status: Fetal Heart Rate (bpm): 158   Movement: Present     General:  Alert, oriented and cooperative. Patient is in no acute distress.  Skin: Skin is warm and dry. No rash noted.   Cardiovascular: Normal heart rate noted  Respiratory: Normal respiratory effort, no problems with respiration noted  Abdomen: Soft, gravid, appropriate for gestational age.  Pain/Pressure: Absent     Pelvic: Cervical exam deferred        Extremities: Normal range of motion.  Edema: None  Mental Status: Normal mood and affect. Normal behavior. Normal judgment and thought content.   Assessment and Plan:  Pregnancy: [id number] at [redacted]w[redacted]d 1. Supervision of high risk pregnancy, antepartum (Primary)   2. [redacted] weeks gestation of pregnancy   3. History of IUFD   4. Gestational diabetes mellitus (GDM) in second trimester, gestational diabetes method of control unspecified  - metFORMIN (GLUCOPHAGE) 500 MG tablet; Take 1 tablet (500 mg total) by mouth 2 (two) times daily with a meal.  Dispense: 60 tablet;  Refill: 5 FBS 100 and stated PP up to above 200 5. History of severe pre-eclampsia ASA daily  6. History of cesarean delivery   Preterm labor symptoms and general obstetric precautions including but not limited to vaginal bleeding, contractions, leaking of fluid and fetal movement were reviewed in detail with the patient. Please refer to After Visit Summary for other counseling recommendations.   Return in about 2 weeks (around 04/08/2023).  Future Appointments  Date Time Provider Department Center  03/29/2023  2:15 PM [facility] NURSE [facility]  03/29/2023  2:30 PM [facility] US3 [facility]    Scheryl Darter, MD

## 2023-03-29 ENCOUNTER — Ambulatory Visit: Payer: Medicaid Other | Attending: Maternal & Fetal Medicine

## 2023-03-29 ENCOUNTER — Ambulatory Visit: Payer: Medicaid Other | Admitting: *Deleted

## 2023-03-29 ENCOUNTER — Other Ambulatory Visit: Payer: Self-pay | Admitting: *Deleted

## 2023-03-29 ENCOUNTER — Ambulatory Visit: Payer: Medicaid Other | Attending: Maternal & Fetal Medicine | Admitting: Maternal & Fetal Medicine

## 2023-03-29 VITALS — BP 120/69 | HR 108

## 2023-03-29 DIAGNOSIS — O0990 Supervision of high risk pregnancy, unspecified, unspecified trimester: Secondary | ICD-10-CM | POA: Insufficient documentation

## 2023-03-29 DIAGNOSIS — D563 Thalassemia minor: Secondary | ICD-10-CM

## 2023-03-29 DIAGNOSIS — E669 Obesity, unspecified: Secondary | ICD-10-CM

## 2023-03-29 DIAGNOSIS — O09293 Supervision of pregnancy with other poor reproductive or obstetric history, third trimester: Secondary | ICD-10-CM | POA: Diagnosis not present

## 2023-03-29 DIAGNOSIS — O99212 Obesity complicating pregnancy, second trimester: Secondary | ICD-10-CM

## 2023-03-29 DIAGNOSIS — O35BXX0 Maternal care for other (suspected) fetal abnormality and damage, fetal cardiac anomalies, not applicable or unspecified: Secondary | ICD-10-CM

## 2023-03-29 DIAGNOSIS — O3663X0 Maternal care for excessive fetal growth, third trimester, not applicable or unspecified: Secondary | ICD-10-CM | POA: Diagnosis not present

## 2023-03-29 DIAGNOSIS — Z8759 Personal history of other complications of pregnancy, childbirth and the puerperium: Secondary | ICD-10-CM | POA: Diagnosis not present

## 2023-03-29 DIAGNOSIS — O403XX0 Polyhydramnios, third trimester, not applicable or unspecified: Secondary | ICD-10-CM

## 2023-03-29 DIAGNOSIS — O24419 Gestational diabetes mellitus in pregnancy, unspecified control: Secondary | ICD-10-CM

## 2023-03-29 DIAGNOSIS — O99013 Anemia complicating pregnancy, third trimester: Secondary | ICD-10-CM

## 2023-03-29 DIAGNOSIS — O24415 Gestational diabetes mellitus in pregnancy, controlled by oral hypoglycemic drugs: Secondary | ICD-10-CM | POA: Diagnosis not present

## 2023-03-29 DIAGNOSIS — O34219 Maternal care for unspecified type scar from previous cesarean delivery: Secondary | ICD-10-CM | POA: Diagnosis not present

## 2023-03-29 DIAGNOSIS — Z6841 Body Mass Index (BMI) 40.0 and over, adult: Secondary | ICD-10-CM | POA: Diagnosis not present

## 2023-03-29 DIAGNOSIS — Z3A25 25 weeks gestation of pregnancy: Secondary | ICD-10-CM | POA: Diagnosis not present

## 2023-03-29 DIAGNOSIS — Z3A24 24 weeks gestation of pregnancy: Secondary | ICD-10-CM | POA: Diagnosis not present

## 2023-03-29 HISTORY — DX: Maternal care for other (suspected) fetal abnormality and damage, fetal cardiac anomalies, not applicable or unspecified: O35.BXX0

## 2023-03-29 NOTE — Progress Notes (Signed)
   Patient information  Patient Name: Alicia Terry  Patient MRN:   [id number]  Referring practice: MFM Referring Provider: [facility])  MFM CONSULT  Alicia Terry is a 33 y.o. [phone] at [redacted]w[redacted]d here for ultrasound and consultation. 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 Her pregnancy is complicated by elevated BMI, GDMA2 (just started metformin) and a fetal VSD (sm muscular).  Blood sugars are improving with Metformin (F85-101), 2h (105-120).  The fetus is large for gestational age with mild polyhydramnios.  This is also a sign of poor control.  She will continue to take her metformin as prescribed.  She can have dose adjustment as needed in 2 weeks with her OB provider.  Sonographic findings Single intrauterine pregnancy at 24w 6d.  Fetal cardiac activity:  Observed and appears normal. Presentation: . Interval fetal anatomy appears normal. Fetal biometry shows the estimated fetal weight at the 93 percentile. Amniotic fluid volume: Within normal limits. MVP: 9.59 cm. Placenta: Anterior.  Recommendations 1. Serial growth ultrasounds every 4-6 weeks until delivery 2. Antenatal testing to start around 32 weeks  3. Delivery around [redacted] weeks gestation 4. Continue glucose assessment with OB provider.  Likely will need to maximize metformin due to signs of poor control. 5. Postnatal echo for fetal VSD  There are limitations of prenatal ultrasound such as the inability to detect certain abnormalities due  to poor visualization. Various factors such as fetal position, gestational age and maternal body habitus may increase the difficulty in visualizing the fetal anatomy.     Review of Systems: A review of systems was performed and was negative except per HPI   Vitals and Physical Exam    03/29/2023    2:25 PM 03/25/2023    2:38 PM 02/28/2023    9:30 AM  Vitals with BMI  Weight  289 lbs   Systolic 120 114 952  Diastolic 69 74 60  Pulse 108 99 101    Sitting comfortably on the sonogram table Nonlabored breathing Normal rate and rhythm Abdomen is nontender  Past pregnancies OB History  Gravida Para Term Preterm AB Living  4 2 1 1 1 1   SAB IAB Ectopic Multiple Live Births  0 0 1 0 1    # Outcome Date GA Lbr Len/2nd Weight Sex Type Anes PTL Lv  4 Current           3 Term 01/23/22 [redacted]w[redacted]d / 05:16 9 lb 2 oz (4.14 kg) M CS-LTranv EPI  LIV  2 Preterm 07/21/19 [redacted]w[redacted]d   F Vag-Spont None  FD  1 Ectopic              I spent 10 minutes reviewing the patients chart, including labs and images as well as counseling the patient about her medical conditions. Greater than 50% of the time was spent in direct face-to-face patient counseling.  Braxton Feathers  MFM, [facility]   03/29/2023  4:33 PM

## 2023-04-08 ENCOUNTER — Other Ambulatory Visit: Payer: Self-pay

## 2023-04-08 ENCOUNTER — Ambulatory Visit: Payer: Medicaid Other | Admitting: Obstetrics and Gynecology

## 2023-04-08 VITALS — BP 121/72 | HR 114 | Wt 290.0 lb

## 2023-04-08 DIAGNOSIS — O24419 Gestational diabetes mellitus in pregnancy, unspecified control: Secondary | ICD-10-CM

## 2023-04-08 DIAGNOSIS — O0992 Supervision of high risk pregnancy, unspecified, second trimester: Secondary | ICD-10-CM

## 2023-04-08 DIAGNOSIS — O3662X0 Maternal care for excessive fetal growth, second trimester, not applicable or unspecified: Secondary | ICD-10-CM | POA: Diagnosis not present

## 2023-04-08 DIAGNOSIS — Z3A26 26 weeks gestation of pregnancy: Secondary | ICD-10-CM | POA: Diagnosis not present

## 2023-04-08 DIAGNOSIS — Z98891 History of uterine scar from previous surgery: Secondary | ICD-10-CM | POA: Diagnosis not present

## 2023-04-08 DIAGNOSIS — Z8759 Personal history of other complications of pregnancy, childbirth and the puerperium: Secondary | ICD-10-CM | POA: Diagnosis not present

## 2023-04-08 DIAGNOSIS — O0990 Supervision of high risk pregnancy, unspecified, unspecified trimester: Secondary | ICD-10-CM

## 2023-04-08 DIAGNOSIS — O3663X0 Maternal care for excessive fetal growth, third trimester, not applicable or unspecified: Secondary | ICD-10-CM

## 2023-04-08 NOTE — Progress Notes (Signed)
   PRENATAL VISIT NOTE  Subjective:  Alicia Terry is a 33 y.o. [phone] at [redacted]w[redacted]d being seen today for ongoing prenatal care.  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  Patient reports no complaints.  Contractions: Not present. Vag. Bleeding: None.  Movement: Present. Denies leaking of fluid.   The following portions of the patient's history were reviewed and updated as appropriate: allergies, current medications, past family history, past medical history, past social history, past surgical history and problem list.   Objective:   Vitals:   04/08/23 1633  BP: 121/72  Pulse: (!) 114  Weight: 290 lb (131.5 kg)    Fetal Status: Fetal Heart Rate (bpm): 154   Movement: Present     General:  Alert, oriented and cooperative. Patient is in no acute distress.  Skin: Skin is warm and dry. No rash noted.   Cardiovascular: Normal heart rate noted  Respiratory: Normal respiratory effort, no problems with respiration noted  Abdomen: Soft, gravid, appropriate for gestational age.  Pain/Pressure: Absent     Pelvic: Cervical exam deferred        Extremities: Normal range of motion.  Edema: None  Mental Status: Normal mood and affect. Normal behavior. Normal judgment and thought content.   Assessment and Plan:  Pregnancy: [id number] at [redacted]w[redacted]d 1. Supervision of high risk pregnancy, antepartum (Primary) BP and FHR normal Doing well, feeling regular movement    2. Gestational diabetes mellitus (GDM) in second trimester, gestational diabetes method of control unspecified Well controlled on Metformin BID 500mg  Fetal echo 2/17  Antenatal  testing around 32 weeks Follow up growth 3/21  3. History of severe pre-eclampsia Normotensive, continue ASA Encouraged to check BP at home  4. History of cesarean delivery Desires TOLAC sign consent next visit with BTL   5. History of IUFD @ 20 weeks first delivery    6. Excessive fetal growth affecting management of pregnancy in third trimester, single or unspecified fetus EFW 93%, AC 92 %, follow up 3/21   7. [redacted] weeks gestation of pregnancy Desires Salpingectomy, to sign consent next visit   Preterm labor symptoms and general obstetric precautions including but not limited to vaginal bleeding, contractions, leaking of fluid and fetal movement were reviewed in detail with the patient. Please refer to After Visit Summary for other counseling recommendations.   Return in about 2 weeks (around 04/22/2023) for OB VISIT (MD or APP).  Future Appointments  Date Time Provider Department Center  04/26/2023  2:15 PM [facility] NURSE [facility]  04/26/2023  2:30 PM [facility] US1 [facility]  05/24/2023  2:15 PM [facility] NURSE [facility]  05/24/2023  2:30 PM [facility] US1 [facility]    Albertine Grates, FNP

## 2023-04-24 ENCOUNTER — Other Ambulatory Visit: Payer: Self-pay

## 2023-04-24 ENCOUNTER — Ambulatory Visit: Admitting: Obstetrics & Gynecology

## 2023-04-24 ENCOUNTER — Encounter: Payer: Self-pay | Admitting: Obstetrics & Gynecology

## 2023-04-24 VITALS — BP 120/77 | HR 119 | Wt 288.1 lb

## 2023-04-24 DIAGNOSIS — O24419 Gestational diabetes mellitus in pregnancy, unspecified control: Secondary | ICD-10-CM | POA: Diagnosis not present

## 2023-04-24 DIAGNOSIS — O3663X0 Maternal care for excessive fetal growth, third trimester, not applicable or unspecified: Secondary | ICD-10-CM

## 2023-04-24 DIAGNOSIS — O0993 Supervision of high risk pregnancy, unspecified, third trimester: Secondary | ICD-10-CM

## 2023-04-24 DIAGNOSIS — Z3A28 28 weeks gestation of pregnancy: Secondary | ICD-10-CM

## 2023-04-24 DIAGNOSIS — O24415 Gestational diabetes mellitus in pregnancy, controlled by oral hypoglycemic drugs: Secondary | ICD-10-CM | POA: Diagnosis not present

## 2023-04-24 DIAGNOSIS — Z98891 History of uterine scar from previous surgery: Secondary | ICD-10-CM | POA: Diagnosis not present

## 2023-04-24 DIAGNOSIS — Z8759 Personal history of other complications of pregnancy, childbirth and the puerperium: Secondary | ICD-10-CM

## 2023-04-24 DIAGNOSIS — O99013 Anemia complicating pregnancy, third trimester: Secondary | ICD-10-CM

## 2023-04-24 DIAGNOSIS — O0990 Supervision of high risk pregnancy, unspecified, unspecified trimester: Secondary | ICD-10-CM

## 2023-04-24 MED ORDER — INSULIN GLARGINE 100 UNIT/ML SOLOSTAR PEN
10.0000 [IU] | PEN_INJECTOR | Freq: Every day | SUBCUTANEOUS | 11 refills | Status: DC
Start: 2023-04-24 — End: 2023-05-13

## 2023-04-24 MED ORDER — INSULIN PEN NEEDLE 32G X 4 MM MISC: 1 refills | Status: DC

## 2023-04-24 MED ORDER — DEXCOM G7 SENSOR MISC: 3 refills | Status: DC

## 2023-04-24 NOTE — Progress Notes (Signed)
 BTL consent signed.   Addison Naegeli, RN  04/24/23

## 2023-04-24 NOTE — Patient Instructions (Signed)

## 2023-04-24 NOTE — Progress Notes (Signed)
 PRENATAL VISIT NOTE  Subjective:  Alicia Terry is a 33 y.o. [phone] at [redacted]w[redacted]d being seen today for ongoing prenatal care.  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  Patient reports no complaints.  Contractions: Not present. Vag. Bleeding: None.  Movement: Present. Denies leaking of fluid.   The following portions of the patient's history were reviewed and updated as appropriate: allergies, current medications, past family history, past medical history, past social history, past surgical history and problem list.   Objective:   Vitals:   04/24/23 1632  BP: 120/77  Pulse: (!) 119  Weight: 288 lb 1.6 oz (130.7 kg)    Fetal Status: Fetal Heart Rate (bpm): 150   Movement: Present     General:  Alert, oriented and cooperative. Patient is in no acute distress.  Skin: Skin is warm and dry. No rash noted.   Cardiovascular: Normal heart rate noted  Respiratory: Normal respiratory effort, no problems with respiration noted  Abdomen: Soft, gravid, appropriate for gestational age.  Pain/Pressure: Present     Pelvic: Cervical exam deferred        Extremities: Normal range of motion.  Edema: Trace  Mental Status: Normal mood and affect. Normal behavior. Normal judgment and thought content.   [place] MFM OB FOLLOW UP Result Date: 03/29/2023 ----------------------------------------------------------------------  OBSTETRICS REPORT                       (Signed Final 03/29/2023 04:34 pm) ---------------------------------------------------------------------- Patient Info  ID #:       [id number]                          D.O.B.:  [birth year] (32  yrs)(F)  Name:       Alicia Terry              Visit Date: 03/29/2023 02:31 pm ---------------------------------------------------------------------- Performed By  Attending:        Braxton Feathers DO       Ref. Address:     [address]                                                             [place], [place]                                                             [address]  Performed By:     Marcellina Millin       [facility]  Referred By:      [facility] ---------------------------------------------------------------------- Orders  #  Description                           Code        Ordered By  1  [place] MFM OB FOLLOW UP                   [phone]    Braxton Feathers ----------------------------------------------------------------------  #  Order #                     Accession #                Episode #  1  [id number]                   [id number]                 [id number] ---------------------------------------------------------------------- Indications  Obesity complicating pregnancy, second         O99.212  trimester (BMI 44)  Gestational diabetes in pregnancy,             O24.415  controlled by oral hypoglycemic drugs  Polyhydramnios, third trimester, antepartum    O40.3XX0  condition or complication, unspecified fetus  Fetal abnormality - other known or             O35.8XX0  suspected (VSD per [facility] ECHO)  Large for gestational age fetus affecting      O36.60X0  management of mother  Poor obstetric history: Previous IUFD          O09.299  Poor obstetric history: Previous severe        O09.299  preeclampsia  Previous cesarean delivery, antepartum         O34.219  Genetic carrier (Alpha Thalassemia Silent      Z14.8  Carrier)  [redacted] weeks gestation  of pregnancy                Z3A.24 ---------------------------------------------------------------------- Fetal Evaluation  Num Of Fetuses:         1  Fetal Heart Rate(bpm):  155  Cardiac Activity:       Observed  Placenta:               Anterior  P. Cord Insertion:      Previously seen  Amniotic Fluid  AFI FV:      Within normal limits  Largest Pocket(cm)                              9.59 ---------------------------------------------------------------------- Biometry  BPD:      64.3  mm     G. Age:  26w 0d         80  %    CI:        73.55   %    70 - 86                                                          FL/HC:      19.6   %    18.7 - 20.3  HC:      238.2  mm     G. Age:  25w 6d         67  %    HC/AC:      1.06        1.04 - 1.22  AC:      224.3  mm     G. Age:  26w 6d         92  %    FL/BPD:     72.5   %    71 - 87  FL:       46.6  mm     G. Age:  25w 4d         57  %    FL/AC:      20.8   %    20 - 24  Est. FW:     909  gm           2 lb     93  % ---------------------------------------------------------------------- Gestational Age  LMP:           25w 5d        Date:  09/30/22                  EDD:   07/07/23  U/S Today:     26w 1d                                        EDD:   07/04/23  Best:          24w 6d     Det. By:  U/S C R L  (12/17/22)    EDD:   07/13/23 ---------------------------------------------------------------------- Targeted Anatomy  Central Nervous System  Calvarium/Cranial V.:  Appears normal         Cereb./Vermis:          Previously seen  Cavum:                 Previously seen         [place]:         Previously seen  Lateral Ventricles:    Previously seen        Midline Falx:           Previously seen  Choroid Plexus:        Previously seen  Spine  Cervical:              Previously seen  Sacral:                 Previously seen  Thoracic:              Previously seen        Shape/Curvature:        Not well visualized  Lumbar:                Previously  seen  Head/Neck  Lips:                  Previously seen        Profile:                Appears normal  Neck:                  Appears normal         Orbits/Eyes:            Appears normal  Nuchal Fold:           Appears normal         Mandible:               Appears normal  Nasal Bone:            Absent                 Maxilla:                Appears normal  Thorax  4 Chamber View:        Appears normal         Interventr. Septum:     Abn-see below  Cardiac Rhythm:        Normal                 Cardiac Axis:           Normal  Cardiac Situs:         Appears normal         Diaphragm:              Appears normal  Rt Outflow Tract:      Previously seen        3 Vessel View:          Appears normal  Lt Outflow Tract:      Appears normal         3 V Trachea View:       Appears normal  Aortic Arch:           Appears normal         IVC:                    Appears normal  Ductal Arch:           Appears normal         Crossing:               Appears normal  SVC:                   Appears normal  Abdomen  Ventral Wall:          Previously seen        Lt Kidney:              Appears normal  Cord Insertion:        Previously seen        Rt Kidney:  Appears normal  Situs:                 Previously seen        Bladder:                Appears normal  Stomach:               Appears normal  Extremities  Lt Humerus:            Previously seen        Lt Femur:               Previously seen  Rt Humerus:            Previously seen        Rt Femur:               Previously seen  Lt Forearm:            Appears normal         Lt Lower Leg:           Previously seen  Rt Forearm:            Appears normal         Rt Lower Leg:           Previously seen  Lt Hand:               Previously seen        Lt Foot:                Nml heel/foot  Rt Hand:               Previously seen        Rt Foot:                Nml heel/foot  Other  Umbilical Cord:        Previously seen        Genitalia:              Female prev seen  ---------------------------------------------------------------------- Comments  MFM Consult Note  The patient is here for ultrasound. Her pregnancy is  complicated by elevated BMI, GDMA2 (just started  metformin) and a fetal VSD (sm muscular).  Blood sugars are  improving with Metformin (F85-101), 2h (105-120).  The fetus  is large for gestational age with mild polyhydramnios.  This is  also a sign of poor control.  She will continue to take her  metformin as prescribed.  She can have dose adjustment as  needed in 2 weeks with her OB provider.  Sonographic findings  Single intrauterine pregnancy at 24w 6d.  Fetal cardiac activity:  Observed and appears normal.  Presentation: .  Interval fetal anatomy appears normal.  Fetal biometry shows the estimated fetal weight at the 93  percentile.  Amniotic fluid volume: Within normal limits. MVP: 9.59 cm.  Placenta: Anterior.  Recommendations  1. Serial growth ultrasounds every 4-6 weeks until delivery  2. Antenatal testing to start around 32 weeks  3. Delivery around [redacted] weeks gestation  4. Continue glucose assessment with OB provider.  Likely will  need to maximize metformin due to signs of poor control.  5. Postnatal echo for fetal VSD  There are limitations of prenatal ultrasound such as the  inability to detect certain abnormalities due to poor  visualization. Various factors such as fetal position,  gestational age and maternal body habitus may increase the  difficulty in visualizing  the fetal anatomy. ----------------------------------------------------------------------                  Braxton Feathers, DO Electronically Signed Final Report   03/29/2023 04:34 pm ----------------------------------------------------------------------    Assessment and Plan:  Pregnancy: [id number] at [redacted]w[redacted]d 1. Gestational diabetes mellitus (GDM) in third trimester (Primary) Reports fastings in 130s, PP in 150s, did not bring log. On Metformin 500 mg po bid.  Recommended addition of long  acting insulin to regimen, Lantus 10 units at bedtime prescribed.  Dexcom also ordered to help follow her blood sugars. A1C to be checked today with other third trimester labs. Continue scans and antenatal testing as per MFM.  - CBC - HIV Antibody (routine testing w rflx) - RPR - Hemoglobin A1c - insulin glargine (LANTUS) 100 UNIT/ML Solostar Pen; Inject 10 Units into the skin at bedtime.  Dispense: 15 mL; Refill: 11 - Insulin Pen Needle 32G X 4 MM MISC; Please use as directed  Dispense: 100 each; Refill: 1 - Continuous Glucose Sensor (DEXCOM G7 SENSOR) MISC; Use one sensor every 10 days as instructed  Dispense: 9 each; Refill: 3  2. Macrosomia of fetus affecting management of mother in third trimester, single or unspecified fetus 03/29/23 EFW 93%  3. History of severe pre-eclampsia Stable BP, on Aspirin 162 mg daily  4. History of cesarean delivery Counseled regarding TOLAC vs RCS; risks/benefits discussed in detail. All questions answered.  Patient elects for TOLAC, consent signed 04/24/2023. She does understand that given her macrosomia, RCS could be recommended, will follow up scans. Patient also wants BTL, discussed that having immediate postpartum BTS is not guaranteed given her BMI/habitus.  Medicaid BTL papers also signed today.  5. [redacted] weeks gestation of pregnancy 6. Supervision of high risk pregnancy, antepartum No other concerns.  Preterm labor symptoms and general obstetric precautions including but not limited to vaginal bleeding, contractions, leaking of fluid and fetal movement were reviewed in detail with the patient. Please refer to After Visit Summary for other counseling recommendations.   Return in about 2 weeks (around 05/08/2023) for OFFICE OB VISIT (MD only).  Future Appointments  Date Time Provider Department Center  04/26/2023  2:15 PM [facility] NURSE [facility]  04/26/2023  2:30 PM [facility] US1 [facility]  05/23/2023  2:15 PM [facility] NURSE [facility]  05/23/2023  2:30  PM [facility] US3 [facility]    Jaynie Collins, MD

## 2023-04-25 ENCOUNTER — Encounter: Payer: Self-pay | Admitting: Obstetrics & Gynecology

## 2023-04-25 ENCOUNTER — Encounter: Payer: Self-pay | Admitting: *Deleted

## 2023-04-25 LAB — HEMOGLOBIN A1C
Est. average glucose Bld gHb Est-mCnc: 192 mg/dL
Hgb A1c MFr Bld: 8.3 % — ABNORMAL HIGH (ref 4.8–5.6)

## 2023-04-26 ENCOUNTER — Other Ambulatory Visit: Payer: Self-pay | Admitting: *Deleted

## 2023-04-26 ENCOUNTER — Ambulatory Visit: Attending: Maternal & Fetal Medicine | Admitting: Maternal & Fetal Medicine

## 2023-04-26 ENCOUNTER — Encounter: Payer: Self-pay | Admitting: Obstetrics & Gynecology

## 2023-04-26 ENCOUNTER — Ambulatory Visit: Payer: Medicaid Other | Admitting: *Deleted

## 2023-04-26 ENCOUNTER — Ambulatory Visit: Payer: Medicaid Other | Attending: Maternal & Fetal Medicine

## 2023-04-26 VITALS — BP 118/61 | HR 109

## 2023-04-26 DIAGNOSIS — O99013 Anemia complicating pregnancy, third trimester: Secondary | ICD-10-CM

## 2023-04-26 DIAGNOSIS — E669 Obesity, unspecified: Secondary | ICD-10-CM | POA: Diagnosis not present

## 2023-04-26 DIAGNOSIS — O24419 Gestational diabetes mellitus in pregnancy, unspecified control: Secondary | ICD-10-CM

## 2023-04-26 DIAGNOSIS — O99212 Obesity complicating pregnancy, second trimester: Secondary | ICD-10-CM | POA: Diagnosis not present

## 2023-04-26 DIAGNOSIS — O3663X0 Maternal care for excessive fetal growth, third trimester, not applicable or unspecified: Secondary | ICD-10-CM

## 2023-04-26 DIAGNOSIS — O403XX0 Polyhydramnios, third trimester, not applicable or unspecified: Secondary | ICD-10-CM | POA: Diagnosis not present

## 2023-04-26 DIAGNOSIS — O09293 Supervision of pregnancy with other poor reproductive or obstetric history, third trimester: Secondary | ICD-10-CM

## 2023-04-26 DIAGNOSIS — O0990 Supervision of high risk pregnancy, unspecified, unspecified trimester: Secondary | ICD-10-CM

## 2023-04-26 DIAGNOSIS — D563 Thalassemia minor: Secondary | ICD-10-CM | POA: Diagnosis not present

## 2023-04-26 DIAGNOSIS — Z3A29 29 weeks gestation of pregnancy: Secondary | ICD-10-CM

## 2023-04-26 DIAGNOSIS — O35BXX0 Maternal care for other (suspected) fetal abnormality and damage, fetal cardiac anomalies, not applicable or unspecified: Secondary | ICD-10-CM | POA: Diagnosis not present

## 2023-04-26 DIAGNOSIS — O99213 Obesity complicating pregnancy, third trimester: Secondary | ICD-10-CM | POA: Diagnosis not present

## 2023-04-26 DIAGNOSIS — O24415 Gestational diabetes mellitus in pregnancy, controlled by oral hypoglycemic drugs: Secondary | ICD-10-CM

## 2023-04-26 DIAGNOSIS — Z3A28 28 weeks gestation of pregnancy: Secondary | ICD-10-CM | POA: Diagnosis not present

## 2023-04-26 DIAGNOSIS — O34219 Maternal care for unspecified type scar from previous cesarean delivery: Secondary | ICD-10-CM | POA: Diagnosis not present

## 2023-04-26 LAB — CBC
Hematocrit: 31.5 % — ABNORMAL LOW (ref 34.0–46.6)
Hemoglobin: 10.3 g/dL — ABNORMAL LOW (ref 11.1–15.9)
MCH: 26.7 pg (ref 26.6–33.0)
MCHC: 32.7 g/dL (ref 31.5–35.7)
MCV: 82 fL (ref 79–97)
Platelets: 266 10*3/uL (ref 150–450)
RBC: 3.86 x10E6/uL (ref 3.77–5.28)
RDW: 13.1 % (ref 11.7–15.4)
WBC: 6.9 10*3/uL (ref 3.4–10.8)

## 2023-04-26 LAB — RPR: RPR Ser Ql: NONREACTIVE

## 2023-04-26 LAB — HIV ANTIBODY (ROUTINE TESTING W REFLEX): HIV Screen 4th Generation wRfx: NONREACTIVE

## 2023-04-26 MED ORDER — FERRIC MALTOL 30 MG PO CAPS: 1.0000 | ORAL_CAPSULE | Freq: Two times a day (BID) | ORAL | 2 refills | Status: AC

## 2023-04-26 NOTE — Progress Notes (Signed)
 Patient information  Patient Name: Alicia Terry  Patient MRN:   [id number]  Referring practice: MFM Referring Provider: [facility])  MFM CONSULT  Alicia Terry is a 33 y.o. [id number] at [redacted]w[redacted]d here for ultrasound and consultation. 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 During today's visit we focused on the following concerns:   RE early onset gestational diabetes: Fasting blood sugars are between to 120 to 130. 2h are 150s. The patient reports reports she was supposed to start insulin but had a problem with her prescription.  She reports she was prescribed insulin but not prescribed the syringes.  To try to modify her diet.  She also just started a increased dose of metformin would like to try that for a week to see if that will achieve proper glycemic control before trying insulin.   Sonographic findings Single intrauterine pregnancy at 28w 6d.  Fetal cardiac activity:  Observed and appears normal. Presentation: Cephalic. Interval fetal anatomy appears normal. Fetal biometry shows the estimated fetal weight at the > 99 percentile. Amniotic fluid volume: Within normal limits. MVP: 7.43 cm. Placenta: Anterior. Good movement and tone.  There are limitations of prenatal ultrasound such as the inability to detect certain abnormalities due to poor visualization. Various factors such as fetal position, gestational age and maternal body habitus may  increase the difficulty in visualizing the fetal anatomy.    Recommendations -Due to poor control, antenatal testing will start at 30 weeks and may need to be twice weekly after 32-34 weeks if control is still poor.  -Growth [place] every 4 weeks -Continue new Metformin dose for another week along with a diabetic diet and moderate exercise. If >50% of glucose values are not at goal then insulin should be prescribed with syringes and the patient taught how to use them.  -Continue routine prenatal care with referring OB provider -Delivery likely around 37 weeks due to LGA and DM  Review of Systems: A review of systems was performed and was negative except per HPI   Vitals and Physical Exam    04/26/2023    2:17 PM 04/24/2023    4:32 PM 04/08/2023    4:33 PM  Vitals with BMI  Weight  288 lbs 2 oz 290 lbs  Systolic 118 120 147  Diastolic 61 77 72  Pulse 109 119 114    Sitting comfortably on the sonogram table Nonlabored breathing Normal rate and rhythm Abdomen is nontender  Past pregnancies OB History  Gravida Para Term Preterm AB Living  4 2 1 1 1 1   SAB IAB Ectopic Multiple Live Births  0 0 1 0 1    # Outcome Date GA Lbr Len/2nd Weight Sex Type Anes PTL Lv  4 Current           3 Term 01/23/22 [redacted]w[redacted]d / 05:16 9 lb 2 oz (4.14 kg) M CS-LTranv EPI  LIV  2 Preterm 07/21/19 [redacted]w[redacted]d   F Vag-Spont None  FD  1 Ectopic              I spent 20 minutes reviewing the patients chart, including labs and images as well as counseling the patient about her medical conditions. Greater than 50% of the time was spent in direct face-to-face patient counseling.  Braxton Feathers, DO Maternal fetal medicine, [place]   04/26/2023  3:47 PM

## 2023-04-26 NOTE — Addendum Note (Signed)
 Addended by: Jaynie Collins A on: 04/26/2023 08:45 PM   Modules accepted: Orders

## 2023-04-29 ENCOUNTER — Telehealth: Payer: Self-pay | Admitting: Lactation Services

## 2023-04-29 ENCOUNTER — Other Ambulatory Visit: Payer: Self-pay

## 2023-04-29 DIAGNOSIS — O24419 Gestational diabetes mellitus in pregnancy, unspecified control: Secondary | ICD-10-CM

## 2023-04-29 NOTE — Telephone Encounter (Signed)
 Alicia Terry (Key: Matthias Hughs) PA Case ID #: [id number] Rx #: [id number] Need Help? Call us at [phone] Outcome Approved today by [facility] PA Case: [id number], Status: Approved, Coverage Starts on: 04/29/2023 12:00:00 AM, Coverage Ends on: 10/26/2023 12:00:00 AM. Effective Date: 04/28/2023 Authorization Expiration Date: 10/25/2023 Drug Dexcom G7 Sensor ePA cloud logo Form [facility] [place] Electronic [place] Form [phone] NCPDP) Original Claim Info 61  Called and spoke with Pharmacy to let them know that PA for Dexcom has been approved.  Called and informed patient that Dexcom has been approved.

## 2023-04-29 NOTE — Telephone Encounter (Signed)
 PA submitted, awaiting determination.   Myrtie Neither (Key: Matthias Hughs) PA Case ID #: [id number] Rx #: [id number] Need Help? Call us at [phone] Status sent iconSent to Plan today Drug Dexcom G7 Sensor ePA cloud logo Form [facility] [place] Electronic [place] Form [phone] NCPDP) Original Claim Info 60

## 2023-05-02 ENCOUNTER — Ambulatory Visit (INDEPENDENT_AMBULATORY_CARE_PROVIDER_SITE_OTHER): Admitting: Dietician

## 2023-05-02 ENCOUNTER — Other Ambulatory Visit: Payer: Self-pay

## 2023-05-02 ENCOUNTER — Encounter: Attending: Obstetrics and Gynecology | Admitting: Dietician

## 2023-05-02 DIAGNOSIS — Z3A00 Weeks of gestation of pregnancy not specified: Secondary | ICD-10-CM | POA: Diagnosis not present

## 2023-05-02 DIAGNOSIS — Z3A29 29 weeks gestation of pregnancy: Secondary | ICD-10-CM

## 2023-05-02 DIAGNOSIS — Z713 Dietary counseling and surveillance: Secondary | ICD-10-CM | POA: Insufficient documentation

## 2023-05-02 DIAGNOSIS — O24419 Gestational diabetes mellitus in pregnancy, unspecified control: Secondary | ICD-10-CM | POA: Insufficient documentation

## 2023-05-02 NOTE — Progress Notes (Addendum)
 Patient was seen for 05/02/2023 Gestational Diabetes on 05/02/2023  Start time 1122 and End time 1240   Estimated due date: 07/12/2023; 106w6d  Clinical: Medications:  Current Outpatient Medications:    Accu-Chek Softclix Lancets lancets, Use four times per day as instructed, Disp: 100 each, Rfl: 12   aspirin EC 81 MG tablet, Take 2 tablets (162 mg total) by mouth daily. Start taking when you are [redacted] weeks pregnant for rest of pregnancy for prevention of preeclampsia, Disp: 300 tablet, Rfl: 2   Ferric Maltol 30 MG CAPS, Take 1 capsule (30 mg total) by mouth 2 (two) times daily. Please take one hour before breakfast and dinner, Disp: 60 capsule, Rfl: 2   glucose blood (ACCU-CHEK GUIDE TEST) test strip, Use four times per day as instructed, Disp: 100 each, Rfl: 12   metFORMIN (GLUCOPHAGE) 500 MG tablet, Take 1 tablet (500 mg total) by mouth 2 (two) times daily with a meal., Disp: 60 tablet, Rfl: 5   Prenatal Vit-Fe Fumarate-FA (PREPLUS) 27-1 MG TABS, Take 1 tablet by mouth daily., Disp: 30 tablet, Rfl: 11   Continuous Glucose Sensor (DEXCOM G7 SENSOR) MISC, Use one sensor every 10 days as instructed (Patient not taking: Reported on 04/26/2023), Disp: 9 each, Rfl: 3   insulin glargine (LANTUS) 100 UNIT/ML Solostar Pen, Inject 10 Units into the skin at bedtime. 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 Pt reports previous GDM stating controlled with oral medications. Pt reports she does not want to start insulin. Pt states she is counting carbohydrates aiming for 45 grams per meal. Pt began crying and expressing frustration with the healthcare system and this RD's assessment questions during this encounter. Pt reports she has been monitoring her blood sugar QID with recent values reported a fasting of 90 mg/dL and 2 hour ppg from yesterday as follows Breakfast at 121, lunch at 113 and dinner at 115 mg/dL.  Pt reports she is taking 1000 mg BID metformin. Pt was able to place her CGM into her left upper arm area during today's encounter and connected to this office. All Pt's questions were questions were answered during this encounter.  Physical Activity: walking at work Stress: 0 out of 10 / self care includes: music Sleep: interrupted average ours 6-8 hours nightly   24 hr Recall:  First Meal:  skips or egg, bacon or sausage, water  Snack:  none Second meal:  1 slices of wheat bread, [place], tomato, cheese, Lipton green tea   Snack:  pack of peanut butter ritz crackers Third meal:  salad with grilled chicken and boiled egg, dressing, water  Snack:  none Beverages:  water, Lipton green tea   NUTRITION INTERVENTION  Nutrition education (E-1) on the following topics:   Initial Follow-up  []  [x]  Definition of Gestational Diabetes []  [x]  Why dietary management is important in controlling blood glucose []  [x]  Effects each nutrient has on blood glucose levels []  [x]  Simple carbohydrates vs complex carbohydrates []  [x]  Fluid intake []  [x]  Creating a balanced meal plan []  [x]  Carbohydrate counting  []  [x]  When to check blood glucose levels []  [x]  Proper blood glucose monitoring techniques []  [x]  Effect of stress and stress reduction techniques  []  [x]  Exercise effect on blood glucose levels, appropriate exercise during  pregnancy []  [x]  Importance of limiting caffeine and abstaining from alcohol and smoking []  [x]  Medications used for blood sugar control during pregnancy []  [x]  Hypoglycemia and rule of 15 []  [x]  Postpartum self care  Patient has a meter prior to visit. Patient is instructed to testing pre breakfast and 2 hours after each meal. CBG: 117 mg/dL, reported as fasting   Patient instructed to monitor glucose levels: CGM  FBS: 60 - <= 95 mg/dL; 2 hour: <= 161 mg/dL  Patient received handouts: Nutrition Diabetes and Pregnancy Carbohydrate Counting List Blood glucose log Snack ideas for diabetes during pregnancy  Patient will be seen for follow-up as needed.

## 2023-05-09 ENCOUNTER — Ambulatory Visit: Attending: Maternal & Fetal Medicine | Admitting: *Deleted

## 2023-05-09 ENCOUNTER — Ambulatory Visit (HOSPITAL_BASED_OUTPATIENT_CLINIC_OR_DEPARTMENT_OTHER): Admitting: *Deleted

## 2023-05-09 VITALS — BP 120/66 | HR 106

## 2023-05-09 DIAGNOSIS — O99213 Obesity complicating pregnancy, third trimester: Secondary | ICD-10-CM

## 2023-05-09 DIAGNOSIS — Z3A30 30 weeks gestation of pregnancy: Secondary | ICD-10-CM

## 2023-05-09 DIAGNOSIS — O0990 Supervision of high risk pregnancy, unspecified, unspecified trimester: Secondary | ICD-10-CM

## 2023-05-09 DIAGNOSIS — O24419 Gestational diabetes mellitus in pregnancy, unspecified control: Secondary | ICD-10-CM

## 2023-05-09 NOTE — Procedures (Signed)
 Alicia Terry 1990-09-26 [redacted]w[redacted]d  Fetus A Non-Stress Test Interpretation for 05/09/23  Indication: Gestational Diabetes medication controlled and obese, his IUFD, abn fetal heart  Fetal Heart Rate A Mode: External Baseline Rate (A): 150 bpm Variability: Moderate Accelerations: 15 x 15 Decelerations: None Multiple birth?: No  Uterine Activity Mode: Toco Contraction Frequency (min): none Resting Tone Palpated: Relaxed  Interpretation (Fetal Testing) Nonstress Test Interpretation: Reactive Comments: Tracing reivewed byDr. Judeth Cornfield

## 2023-05-12 ENCOUNTER — Other Ambulatory Visit: Payer: Self-pay

## 2023-05-12 DIAGNOSIS — O24419 Gestational diabetes mellitus in pregnancy, unspecified control: Secondary | ICD-10-CM

## 2023-05-13 ENCOUNTER — Other Ambulatory Visit: Payer: Self-pay

## 2023-05-13 ENCOUNTER — Ambulatory Visit: Admitting: Obstetrics and Gynecology

## 2023-05-13 ENCOUNTER — Ambulatory Visit

## 2023-05-13 ENCOUNTER — Other Ambulatory Visit: Payer: Self-pay | Admitting: Obstetrics and Gynecology

## 2023-05-13 VITALS — BP 122/69 | HR 108 | Wt 290.0 lb

## 2023-05-13 DIAGNOSIS — O99210 Obesity complicating pregnancy, unspecified trimester: Secondary | ICD-10-CM

## 2023-05-13 DIAGNOSIS — O0993 Supervision of high risk pregnancy, unspecified, third trimester: Secondary | ICD-10-CM | POA: Diagnosis not present

## 2023-05-13 DIAGNOSIS — O99213 Obesity complicating pregnancy, third trimester: Secondary | ICD-10-CM | POA: Diagnosis not present

## 2023-05-13 DIAGNOSIS — Z98891 History of uterine scar from previous surgery: Secondary | ICD-10-CM | POA: Diagnosis not present

## 2023-05-13 DIAGNOSIS — O24419 Gestational diabetes mellitus in pregnancy, unspecified control: Secondary | ICD-10-CM

## 2023-05-13 DIAGNOSIS — O35BXX0 Maternal care for other (suspected) fetal abnormality and damage, fetal cardiac anomalies, not applicable or unspecified: Secondary | ICD-10-CM

## 2023-05-13 DIAGNOSIS — Z3A31 31 weeks gestation of pregnancy: Secondary | ICD-10-CM | POA: Diagnosis not present

## 2023-05-13 DIAGNOSIS — O3663X0 Maternal care for excessive fetal growth, third trimester, not applicable or unspecified: Secondary | ICD-10-CM

## 2023-05-13 DIAGNOSIS — Z8759 Personal history of other complications of pregnancy, childbirth and the puerperium: Secondary | ICD-10-CM

## 2023-05-13 DIAGNOSIS — Z6841 Body Mass Index (BMI) 40.0 and over, adult: Secondary | ICD-10-CM

## 2023-05-13 DIAGNOSIS — O0990 Supervision of high risk pregnancy, unspecified, unspecified trimester: Secondary | ICD-10-CM

## 2023-05-13 MED ORDER — INSULIN GLARGINE 100 UNIT/ML SOLOSTAR PEN: 5.0000 [IU] | PEN_INJECTOR | Freq: Every day | SUBCUTANEOUS | Status: DC

## 2023-05-13 NOTE — Progress Notes (Signed)
 PRENATAL VISIT NOTE  Subjective:  Alicia Terry is a 33 y.o. [phone] at [redacted]w[redacted]d being seen today for ongoing prenatal care.  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  Patient reports no complaints.  Contractions: Not present. Vag. Bleeding: None.  Movement: Present. Denies leaking of fluid.   The following portions of the patient's history were reviewed and updated as appropriate: allergies, current medications, past family history, past medical history, past social history, past surgical history and problem list.   Objective:   Vitals:   05/13/23 1324  BP: 122/69  Pulse: (!) 108  Weight: 290 lb (131.5 kg)    Fetal Status: Fetal Heart Rate (bpm): 151   Movement: Present     General:  Alert, oriented and cooperative. Patient is in no acute distress.  Skin: Skin is warm and dry. No rash noted.   Cardiovascular: Normal heart rate noted  Respiratory: Normal respiratory effort, no problems with respiration noted  Abdomen: Soft, gravid, appropriate for gestational age.  Pain/Pressure: Absent     Pelvic: Cervical exam deferred        Extremities: Normal range of motion.  Edema: None  Mental Status: Normal mood and affect. Normal behavior. Normal judgment and thought content.   Assessment and Plan:  Pregnancy: [id number] at [redacted]w[redacted]d 1. [redacted] weeks gestation of pregnancy (Primary) BTL papers signed 3/19 and pt confirms  2. 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 Gestational diabetes mellitus (GDM) in third trimester, gestational diabetes method of control unspecified I told her that she's likely a DM2 but can't say since she was diagnosed during the pregnancy but it was an early diagnosis a with an A1c 6.1 and 3/19 A1c of 8.3. Patient on metformin 100/100 and lantus 10 at bedtime started on 3/19. Patient states she never took the lantus because she read it could cause weight gain. I told her to do screenshots of her dexcom for her AM fasting and 2 hour post prandial but they appear to be high normal to just barely elevated. After d/w her, she is amenable to doing insulin. Will start at 5u at bedtime instead of 10 and pt to check a 0300 blood sugar.  Continue with weekly testing.  4/3: rNST 3/21: >99%, 1817g, ac >99%, afi 20 - insulin glargine (LANTUS) 100 UNIT/ML Solostar Pen; Inject 5 Units into the skin at bedtime.  4. Supervision of high risk pregnancy, antepartum 28wk labs wnl  5. Obesity in pregnancy Weight stable  6. BMI 40.0-44.9, adult (HCC)  7. History of IUFD July 2021: 20-21wk IUFD after presenting with VB, LOF  8. History of cesarean delivery Previously wanted to tolac but not desires rpt. Request sent for 37wk rpt and BTL  9. History of severe pre-eclampsia  10. Excessive fetal growth affecting management of pregnancy in third trimester, single or unspecified fetus See above.   Preterm labor symptoms and general obstetric precautions including but not limited to vaginal bleeding, contractions, leaking of fluid and fetal movement were reviewed in detail with the patient. Please refer to  After Visit Summary for other counseling recommendations.   Return in 11 days (on 05/24/2023) for md visit, high risk ob, in person.  Future Appointments  Date Time Provider Department Center  05/23/2023  2:00 PM [facility] PROVIDER 1 [facility]  05/23/2023  2:30 PM [facility] US3 [facility]    [place], MD

## 2023-05-23 ENCOUNTER — Ambulatory Visit

## 2023-05-23 ENCOUNTER — Other Ambulatory Visit: Payer: Self-pay | Admitting: *Deleted

## 2023-05-23 ENCOUNTER — Ambulatory Visit: Attending: Maternal & Fetal Medicine

## 2023-05-23 ENCOUNTER — Ambulatory Visit (HOSPITAL_BASED_OUTPATIENT_CLINIC_OR_DEPARTMENT_OTHER): Admitting: Maternal & Fetal Medicine

## 2023-05-23 VITALS — BP 116/60 | HR 102

## 2023-05-23 DIAGNOSIS — O99212 Obesity complicating pregnancy, second trimester: Secondary | ICD-10-CM | POA: Insufficient documentation

## 2023-05-23 DIAGNOSIS — O0990 Supervision of high risk pregnancy, unspecified, unspecified trimester: Secondary | ICD-10-CM

## 2023-05-23 DIAGNOSIS — Z3A32 32 weeks gestation of pregnancy: Secondary | ICD-10-CM | POA: Insufficient documentation

## 2023-05-23 DIAGNOSIS — O403XX0 Polyhydramnios, third trimester, not applicable or unspecified: Secondary | ICD-10-CM

## 2023-05-23 DIAGNOSIS — O99213 Obesity complicating pregnancy, third trimester: Secondary | ICD-10-CM

## 2023-05-23 DIAGNOSIS — E669 Obesity, unspecified: Secondary | ICD-10-CM | POA: Diagnosis not present

## 2023-05-23 DIAGNOSIS — O358XX0 Maternal care for other (suspected) fetal abnormality and damage, not applicable or unspecified: Secondary | ICD-10-CM | POA: Diagnosis not present

## 2023-05-23 DIAGNOSIS — O24419 Gestational diabetes mellitus in pregnancy, unspecified control: Secondary | ICD-10-CM | POA: Insufficient documentation

## 2023-05-23 DIAGNOSIS — O24415 Gestational diabetes mellitus in pregnancy, controlled by oral hypoglycemic drugs: Secondary | ICD-10-CM

## 2023-05-23 DIAGNOSIS — O3663X0 Maternal care for excessive fetal growth, third trimester, not applicable or unspecified: Secondary | ICD-10-CM | POA: Diagnosis not present

## 2023-05-23 DIAGNOSIS — O3660X0 Maternal care for excessive fetal growth, unspecified trimester, not applicable or unspecified: Secondary | ICD-10-CM

## 2023-05-23 DIAGNOSIS — O24414 Gestational diabetes mellitus in pregnancy, insulin controlled: Secondary | ICD-10-CM | POA: Insufficient documentation

## 2023-05-23 NOTE — Progress Notes (Signed)
   Patient information  Patient Name: Alicia Terry  Patient MRN:   [id number]  Referring practice: MFM Referring Provider: [facility])  MFM CONSULT  Alicia Terry is a 33 y.o. [phone] at [redacted]w[redacted]d here for ultrasound and consultation. 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  RE GDMA2: Blood sugars are doing well overall. Pt has f/u with her OB provider. Discussed a 37 week delivery due to an LGA fetus.    Sonographic findings Single intrauterine pregnancy. Fetal cardiac activity: Observed. Presentation: Cephalic. Interval fetal anatomy appears normal. Fetal biometry shows the estimated fetal weight at the > 99 percentile. Amniotic fluid: Within normal limits.  MVP: 5.97 cm. Placenta: Anterior. BPP: 8/8.   There are limitations of prenatal ultrasound such as the inability to detect certain abnormalities due to poor visualization. Various factors such as fetal position, gestational age and maternal body habitus may increase the difficulty in visualizing the fetal anatomy.    Recommendations -Weekly antenatal testing until delivery -Growth ultrasounds every 4 weeks until delivery  -Delivery around [redacted] weeks gestation    Review of Systems: A review of systems was performed and was negative except per HPI   Vitals and Physical Exam    05/23/2023    2:08 PM  05/13/2023    1:24 PM 05/09/2023    9:39 AM  Vitals with BMI  Weight  290 lbs   Systolic 116 122 147  Diastolic 60 69 66  Pulse 102 108 106    Sitting comfortably on the sonogram table Nonlabored breathing Normal rate and rhythm Abdomen is nontender  Past pregnancies OB History  Gravida Para Term Preterm AB Living  4 2 1 1 1 1   SAB IAB Ectopic Multiple Live Births  0 0 1 0 1    # Outcome Date GA Lbr Len/2nd Weight Sex Type Anes PTL Lv  4 Current           3 Term 01/23/22 [redacted]w[redacted]d / 05:16 9 lb 2 oz (4.14 kg) M CS-LTranv EPI  LIV  2 Preterm 07/21/19 [redacted]w[redacted]d   F Vag-Spont None  FD  1 Ectopic              I spent 10 minutes reviewing the patients chart, including labs and images as well as counseling the patient about her medical conditions. Greater than 50% of the time was spent in direct face-to-face patient counseling.  Alicia Terry  MFM, [facility]   05/23/2023  4:21 PM

## 2023-05-23 NOTE — Progress Notes (Unsigned)
   Patient information  Patient Name: Alicia Terry  Patient MRN:   [id number]  Referring practice: {MFM Referring Provider:[id number]}  MFM CONSULT  Alicia Terry is a 33 y.o. [id number] at [redacted]w[redacted]d here for ultrasound and consultation. 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  RE GDMA2 with LGA:    ***  There are limitations of prenatal ultrasound such as the inability to detect certain abnormalities due to poor visualization. Various factors such as fetal position, gestational age and maternal body habitus may increase the difficulty in visualizing the fetal anatomy.    Recommendations -Serial growth ultrasounds every 4-6 weeks until delivery -Antenatal testing to start around 32 weeks  -Delivery around *** weeks gestation  Review of Systems: A review of systems was performed and was negative except per HPI   Vitals and Physical Exam    05/23/2023    2:08 PM 05/13/2023    1:24 PM 05/09/2023    9:39 AM  Vitals with BMI  Weight  290 lbs   Systolic 116 122 413  Diastolic 60 69 66  Pulse 102 108 106    Sitting comfortably on the sonogram table Nonlabored breathing Normal rate and rhythm Abdomen is nontender  Past pregnancies OB History  Gravida Para Term Preterm AB Living  4 2 1 1 1 1   SAB IAB Ectopic Multiple Live Births  0 0 1 0 1    # Outcome Date GA Lbr Len/2nd Weight Sex Type  Anes PTL Lv  4 Current           3 Term 01/23/22 [redacted]w[redacted]d / 05:16 9 lb 2 oz (4.14 kg) M CS-LTranv EPI  LIV  2 Preterm 07/21/19 [redacted]w[redacted]d   F Vag-Spont None  FD  1 Ectopic              I spent *** minutes reviewing the patients chart, including labs and images as well as counseling the patient about her medical conditions. Greater than 50% of the time was spent in direct face-to-face patient counseling.  Penney Bowling  MFM, [facility]   05/23/2023  3:27 PM

## 2023-05-24 ENCOUNTER — Ambulatory Visit: Payer: Medicaid Other

## 2023-05-27 ENCOUNTER — Encounter: Admitting: Obstetrics and Gynecology

## 2023-05-28 ENCOUNTER — Encounter (HOSPITAL_COMMUNITY): Payer: Self-pay

## 2023-05-28 NOTE — Patient Instructions (Addendum)
 Alicia Terry  05/28/2023   Your procedure is scheduled on:  06/21/2023  Arrive at 1015 at Entrance C on [address] at [facility]  and [employer]. You are invited to use the FREE valet parking or use the Visitor's parking deck.  Pick up the phone at the desk and dial  [phone].  Call this number if you have problems the morning of surgery: [phone]  Remember:   Do not eat food:(After Midnight) Desps de medianoche.  You may drink clear liquids until arrival at ___1015__.  Clear liquids means a liquid you can see thru.  It can have color such as Cola or Kool aid.  Tea is OK and coffee as long as no milk or creamer of any kind.  Take these medicines the morning of surgery with A SIP OF WATER :   If taking insulin  at bedtime take half of the prescribed dose the night before surgery and no medicine on day of surgery.   Do not wear jewelry, make-up or nail polish.  Do not wear lotions, powders, or perfumes. Do not wear deodorant.  Do not shave 48 hours prior to surgery.  Do not bring valuables to the hospital.  [facility] is not   responsible for any belongings or valuables brought to the hospital.  Contacts, dentures or bridgework may not be worn into surgery.  Leave suitcase in the car. After surgery it may be brought to your room.  For patients admitted to the hospital, checkout time is 11:00 AM the day of              discharge.      Please read over the following fact sheets that you were given:     Preparing for Surgery

## 2023-05-29 ENCOUNTER — Telehealth: Payer: Self-pay | Admitting: *Deleted

## 2023-05-29 NOTE — Telephone Encounter (Signed)
 I called [place] to follow up and she reports she had an episode once of feeling lightheaded but not dizzy about a week ago. She confirms her blood sugar was 52 and she ate. States she hasn't taken insulin  since then but has taken her metformin . She confirms she is wearing her dexcom and her blood sugars are " fine and were never really elevated", states average blood sugar is 126. She states she plans to discuss with doctor Monday 5/28 at her next ob visit. I informed her I will forward to doctor and if anything he wants us  to pass on before then, that we would. Alejandra Hurst

## 2023-05-29 NOTE — Telephone Encounter (Signed)
 Received message from L&D admit nurse from yesterday stating patient is not taking insulin  because of a low blood sugar. Need to call patient to follow up. Alicia Terry

## 2023-05-30 ENCOUNTER — Other Ambulatory Visit

## 2023-05-30 ENCOUNTER — Encounter: Payer: Self-pay | Admitting: *Deleted

## 2023-06-03 ENCOUNTER — Encounter: Payer: Self-pay | Admitting: *Deleted

## 2023-06-03 ENCOUNTER — Ambulatory Visit: Admitting: Obstetrics and Gynecology

## 2023-06-03 ENCOUNTER — Other Ambulatory Visit: Payer: Self-pay

## 2023-06-03 VITALS — BP 128/80 | HR 113 | Wt 294.6 lb

## 2023-06-03 DIAGNOSIS — Z8759 Personal history of other complications of pregnancy, childbirth and the puerperium: Secondary | ICD-10-CM

## 2023-06-03 DIAGNOSIS — O3663X0 Maternal care for excessive fetal growth, third trimester, not applicable or unspecified: Secondary | ICD-10-CM

## 2023-06-03 DIAGNOSIS — O0993 Supervision of high risk pregnancy, unspecified, third trimester: Secondary | ICD-10-CM | POA: Diagnosis not present

## 2023-06-03 DIAGNOSIS — Z6841 Body Mass Index (BMI) 40.0 and over, adult: Secondary | ICD-10-CM | POA: Diagnosis not present

## 2023-06-03 DIAGNOSIS — O24419 Gestational diabetes mellitus in pregnancy, unspecified control: Secondary | ICD-10-CM

## 2023-06-03 DIAGNOSIS — Z3A34 34 weeks gestation of pregnancy: Secondary | ICD-10-CM

## 2023-06-03 DIAGNOSIS — O35BXX0 Maternal care for other (suspected) fetal abnormality and damage, fetal cardiac anomalies, not applicable or unspecified: Secondary | ICD-10-CM | POA: Diagnosis not present

## 2023-06-03 DIAGNOSIS — Z98891 History of uterine scar from previous surgery: Secondary | ICD-10-CM

## 2023-06-03 DIAGNOSIS — O0990 Supervision of high risk pregnancy, unspecified, unspecified trimester: Secondary | ICD-10-CM

## 2023-06-03 NOTE — Progress Notes (Signed)
   PRENATAL VISIT NOTE  Subjective:  Alicia Terry is a 33 y.o. [phone] at [redacted]w[redacted]d being seen today for ongoing prenatal care.  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  Patient doing well with no acute concerns today. She reports no complaints.  Contractions: Not present.  .  Movement: Present. Denies leaking of fluid. Questionable compliance with diabetes maintenance.  The following portions of the patient's history were reviewed and updated as appropriate: allergies, current medications, past family history, past medical history, past social history, past surgical history and problem list. Problem list updated.  Objective:   Vitals:   06/03/23 1444  BP: 128/80  Pulse: (!) 113  Weight: 294 lb 9.6 oz (133.6 kg)    Fetal Status: Fetal Heart Rate (bpm): 142   Movement: Present     General:  Alert, oriented and cooperative. Patient is in no acute distress.  Skin: Skin is warm and dry. No rash noted.   Cardiovascular: Normal heart rate noted  Respiratory: Normal respiratory effort, no problems with respiration noted  Abdomen: Soft, gravid, appropriate for gestational age.  Pain/Pressure: Present     Pelvic: Cervical exam deferred        Extremities: Normal range of motion.  Edema: None  Mental Status:  Normal mood and affect. Normal behavior. Normal judgment and thought content.   Assessment and Plan:  Pregnancy: [name] at [redacted]w[redacted]d  1. [redacted] weeks gestation of pregnancy (Primary)   2. Anomaly of fetal heart affecting singleton  pregnancy, antepartum (VSD?)   3. Gestational diabetes mellitus (GDM) in third trimester, gestational diabetes method of control unspecified FBS:  91 PPBS: 118-127  4. Supervision of high risk pregnancy, antepartum Continue routine prenatal care  5. History of severe pre-eclampsia No s/sx preeclampsia  6. History of IUFD Weekly BPP until delivery  7. History of cesarean delivery Repeat c section at 36-37 weeks with BTL  8. Excessive fetal growth affecting management of pregnancy in third trimester, single or unspecified fetus LGA fetus at >99%  9. BMI 40.0-44.9, adult (HCC)   Preterm labor symptoms and general obstetric precautions including but not limited to vaginal bleeding, contractions, leaking of fluid and fetal movement were reviewed in detail with the patient.  Please refer to After Visit Summary for other counseling recommendations.   Return in about 1 week (around 06/10/2023) for [facility], in person.   Avie Boeck, MD Faculty Attending Center for [facility]

## 2023-06-04 ENCOUNTER — Other Ambulatory Visit: Payer: Self-pay | Admitting: *Deleted

## 2023-06-04 DIAGNOSIS — O24419 Gestational diabetes mellitus in pregnancy, unspecified control: Secondary | ICD-10-CM

## 2023-06-04 DIAGNOSIS — O3663X0 Maternal care for excessive fetal growth, third trimester, not applicable or unspecified: Secondary | ICD-10-CM

## 2023-06-04 DIAGNOSIS — O35BXX0 Maternal care for other (suspected) fetal abnormality and damage, fetal cardiac anomalies, not applicable or unspecified: Secondary | ICD-10-CM

## 2023-06-04 DIAGNOSIS — O0990 Supervision of high risk pregnancy, unspecified, unspecified trimester: Secondary | ICD-10-CM

## 2023-06-04 DIAGNOSIS — O99210 Obesity complicating pregnancy, unspecified trimester: Secondary | ICD-10-CM

## 2023-06-04 DIAGNOSIS — Z8759 Personal history of other complications of pregnancy, childbirth and the puerperium: Secondary | ICD-10-CM

## 2023-06-05 ENCOUNTER — Other Ambulatory Visit: Payer: Self-pay

## 2023-06-05 ENCOUNTER — Ambulatory Visit

## 2023-06-05 DIAGNOSIS — O3663X0 Maternal care for excessive fetal growth, third trimester, not applicable or unspecified: Secondary | ICD-10-CM

## 2023-06-05 DIAGNOSIS — O35BXX0 Maternal care for other (suspected) fetal abnormality and damage, fetal cardiac anomalies, not applicable or unspecified: Secondary | ICD-10-CM | POA: Diagnosis not present

## 2023-06-05 DIAGNOSIS — O0990 Supervision of high risk pregnancy, unspecified, unspecified trimester: Secondary | ICD-10-CM

## 2023-06-05 DIAGNOSIS — Z3A34 34 weeks gestation of pregnancy: Secondary | ICD-10-CM

## 2023-06-05 DIAGNOSIS — O24419 Gestational diabetes mellitus in pregnancy, unspecified control: Secondary | ICD-10-CM

## 2023-06-05 DIAGNOSIS — O0993 Supervision of high risk pregnancy, unspecified, third trimester: Secondary | ICD-10-CM

## 2023-06-05 DIAGNOSIS — Z8759 Personal history of other complications of pregnancy, childbirth and the puerperium: Secondary | ICD-10-CM

## 2023-06-05 DIAGNOSIS — O99213 Obesity complicating pregnancy, third trimester: Secondary | ICD-10-CM | POA: Diagnosis not present

## 2023-06-05 DIAGNOSIS — O99210 Obesity complicating pregnancy, unspecified trimester: Secondary | ICD-10-CM

## 2023-06-06 ENCOUNTER — Other Ambulatory Visit

## 2023-06-06 ENCOUNTER — Other Ambulatory Visit: Payer: Self-pay | Admitting: *Deleted

## 2023-06-06 DIAGNOSIS — O3663X0 Maternal care for excessive fetal growth, third trimester, not applicable or unspecified: Secondary | ICD-10-CM

## 2023-06-06 DIAGNOSIS — O35BXX0 Maternal care for other (suspected) fetal abnormality and damage, fetal cardiac anomalies, not applicable or unspecified: Secondary | ICD-10-CM

## 2023-06-06 DIAGNOSIS — Z8759 Personal history of other complications of pregnancy, childbirth and the puerperium: Secondary | ICD-10-CM

## 2023-06-06 DIAGNOSIS — O0990 Supervision of high risk pregnancy, unspecified, unspecified trimester: Secondary | ICD-10-CM

## 2023-06-06 DIAGNOSIS — O99210 Obesity complicating pregnancy, unspecified trimester: Secondary | ICD-10-CM

## 2023-06-06 DIAGNOSIS — O24419 Gestational diabetes mellitus in pregnancy, unspecified control: Secondary | ICD-10-CM

## 2023-06-10 ENCOUNTER — Other Ambulatory Visit (HOSPITAL_COMMUNITY)
Admission: RE | Admit: 2023-06-10 | Discharge: 2023-06-10 | Disposition: A | Discharge status: Home / Self Care | Source: Ambulatory Visit | Attending: Family Medicine | Admitting: Family Medicine

## 2023-06-10 ENCOUNTER — Ambulatory Visit: Admitting: Family Medicine

## 2023-06-10 ENCOUNTER — Ambulatory Visit

## 2023-06-10 ENCOUNTER — Other Ambulatory Visit: Payer: Self-pay

## 2023-06-10 VITALS — BP 136/89 | HR 104 | Wt 296.0 lb

## 2023-06-10 DIAGNOSIS — Z3A35 35 weeks gestation of pregnancy: Secondary | ICD-10-CM

## 2023-06-10 DIAGNOSIS — O99213 Obesity complicating pregnancy, third trimester: Secondary | ICD-10-CM | POA: Diagnosis not present

## 2023-06-10 DIAGNOSIS — Z113 Encounter for screening for infections with a predominantly sexual mode of transmission: Secondary | ICD-10-CM | POA: Insufficient documentation

## 2023-06-10 DIAGNOSIS — O24419 Gestational diabetes mellitus in pregnancy, unspecified control: Secondary | ICD-10-CM

## 2023-06-10 DIAGNOSIS — O35BXX0 Maternal care for other (suspected) fetal abnormality and damage, fetal cardiac anomalies, not applicable or unspecified: Secondary | ICD-10-CM | POA: Diagnosis not present

## 2023-06-10 DIAGNOSIS — O99210 Obesity complicating pregnancy, unspecified trimester: Secondary | ICD-10-CM

## 2023-06-10 DIAGNOSIS — Z8759 Personal history of other complications of pregnancy, childbirth and the puerperium: Secondary | ICD-10-CM

## 2023-06-10 DIAGNOSIS — Z3493 Encounter for supervision of normal pregnancy, unspecified, third trimester: Secondary | ICD-10-CM | POA: Diagnosis not present

## 2023-06-10 DIAGNOSIS — O0993 Supervision of high risk pregnancy, unspecified, third trimester: Secondary | ICD-10-CM

## 2023-06-10 DIAGNOSIS — O3663X0 Maternal care for excessive fetal growth, third trimester, not applicable or unspecified: Secondary | ICD-10-CM

## 2023-06-10 DIAGNOSIS — O0990 Supervision of high risk pregnancy, unspecified, unspecified trimester: Secondary | ICD-10-CM

## 2023-06-10 NOTE — Progress Notes (Signed)
 PRENATAL VISIT NOTE  Subjective:  Alicia Terry is a 33 y.o. [phone] at [redacted]w[redacted]d being seen today for ongoing prenatal care.  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  Patient reports no bleeding, no contractions, no cramping, and no leaking.  Contractions: Not present. Vag. Bleeding: None.  Movement: Present. Denies leaking of fluid.   The following portions of the patient's history were reviewed and updated as appropriate: allergies, current medications, past family history, past medical history, past social history, past surgical history and problem list.   Objective:   Vitals:   06/10/23 1014 06/10/23 1040  BP: (!) 136/97 136/89  Pulse:  (!) 104  Weight: 296 lb (134.3 kg)     Fetal Status: Fetal Heart Rate (bpm): 136   Movement: Present     General:  Alert, oriented and cooperative. Patient is in no acute distress.  Skin: Skin is warm and dry. No rash noted.   Cardiovascular: Normal heart rate noted  Respiratory: Normal respiratory effort, no problems with respiration noted  Abdomen: Soft, gravid, appropriate for gestational age.  Pain/Pressure: Absent     Pelvic: Cervical exam deferred        Extremities: Normal range of motion.  Edema: None  Mental Status: Normal mood and affect. Normal behavior. Normal judgment and thought content.   Assessment and Plan:  Pregnancy: [name] at [redacted]w[redacted]d 1. Supervision of high risk pregnancy, antepartum FHR and BP appropriate today  2. History of IUFD  3. Gestational diabetes  mellitus (GDM), antepartum, gestational diabetes method of control unspecified Patient using Dexcom.  Reports that she has been having trouble with the Dexcom.  Fasting blood sugars typically in the 90s although she reports that this morning it was initially low 90s but at the time of the visit it was 120.  She reports that she has not eaten anything today.  She is not taking her insulin  regularly and reports that she takes the 5 mg of Lantus  2-3 times per week because she has had several low blood sugars in the 70s.  She is taking metformin  1000 mg twice daily. -Patient scheduled for repeat C-section at 37 weeks - Patient scheduled for BPP today -Discussed kick counts - BPP next week  4. Obesity in pregnancy  5. 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 Excessive fetal growth affecting management of pregnancy in third trimester, single or unspecified fetus LGA greater than the 99th percentile.  No follow-up scans have been scheduled.  Her repeat C-section scheduled for 37 weeks  7. [redacted] weeks gestation of pregnancy (Primary) - Culture, beta strep (group b only) - GC/Chlamydia probe amp ([place])not at [facility]  Preterm labor symptoms and general obstetric precautions including but not limited to vaginal bleeding, contractions, leaking of fluid and fetal movement were reviewed in detail with the patient. Please refer to After Visit Summary for other counseling recommendations.   No follow-ups on file.  Future Appointments  Date Time Provider Department Center  06/18/2023  1:15 PM [facility] US2 [facility]  06/19/2023  9:30 AM [facility] PAT 1 [facility] None    Ferdie Housekeeper,  MD

## 2023-06-11 LAB — GC/CHLAMYDIA PROBE AMP (~~LOC~~) NOT AT ARMC
Chlamydia: NEGATIVE
Comment: NEGATIVE
Comment: NORMAL
Neisseria Gonorrhea: NEGATIVE

## 2023-06-13 ENCOUNTER — Telehealth: Payer: Self-pay | Admitting: Family Medicine

## 2023-06-13 ENCOUNTER — Other Ambulatory Visit

## 2023-06-13 ENCOUNTER — Encounter: Payer: Self-pay | Admitting: Obstetrics and Gynecology

## 2023-06-13 LAB — CULTURE, BETA STREP (GROUP B ONLY): Strep Gp B Culture: POSITIVE — AB

## 2023-06-13 NOTE — Telephone Encounter (Signed)
 Called patient and reviewed GBS results and treatment. Patient verbalized understanding and asked if she would still be treated if she's having a c-section. Told patient yes, we still treat when someone is having a c-section. Patient verbalized understanding.

## 2023-06-13 NOTE — Telephone Encounter (Signed)
 Patient called and said her mychart labs came back and she was positive for Strep Gp B Culture she would like to speak to a nurse ASAP since she is 35w 6d days

## 2023-06-17 ENCOUNTER — Other Ambulatory Visit

## 2023-06-17 NOTE — Anesthesia Preprocedure Evaluation (Signed)
 Anesthesia Evaluation  Patient identified by MRN, date of birth, ID band Patient awake    Reviewed: Allergy & Precautions, NPO status , Patient's Chart, lab work & pertinent test results  History of Anesthesia Complications Negative for: history of anesthetic complications  Airway Mallampati: II  TM Distance: >3 FB Neck ROM: Full    Dental no notable dental hx.    Pulmonary neg pulmonary ROS   Pulmonary exam normal        Cardiovascular hypertension, Normal cardiovascular exam     Neuro/Psych negative neurological ROS     GI/Hepatic negative GI ROS, Neg liver ROS,,,  Endo/Other  diabetes, Gestational, Oral Hypoglycemic Agents, Insulin  Dependent  Class 3 obesity  Renal/GU negative Renal ROS     Musculoskeletal negative musculoskeletal ROS (+)    Abdominal   Peds  Hematology  (+) Blood dyscrasia (Hgb 10.9), anemia   Anesthesia Other Findings Day of surgery medications reviewed with patient.  Reproductive/Obstetrics (+) Pregnancy (Hx of C/S x1)                              Anesthesia Physical Anesthesia Plan  ASA: 3  Anesthesia Plan: Spinal   Post-op Pain Management: Ofirmev  IV (intra-op)*   Induction:   PONV Risk Score and Plan: 4 or greater and Treatment may vary due to age or medical condition, Ondansetron  and Dexamethasone   Airway Management Planned: Natural Airway  Additional Equipment: None  Intra-op Plan:   Post-operative Plan:   Informed Consent: I have reviewed the patients History and Physical, chart, labs and discussed the procedure including the risks, benefits and alternatives for the proposed anesthesia with the patient or authorized representative who has indicated his/her understanding and acceptance.       Plan Discussed with: CRNA  Anesthesia Plan Comments:          Anesthesia Quick Evaluation

## 2023-06-18 ENCOUNTER — Other Ambulatory Visit: Payer: Self-pay

## 2023-06-18 ENCOUNTER — Ambulatory Visit (INDEPENDENT_AMBULATORY_CARE_PROVIDER_SITE_OTHER)

## 2023-06-18 DIAGNOSIS — Z3A36 36 weeks gestation of pregnancy: Secondary | ICD-10-CM | POA: Diagnosis not present

## 2023-06-18 DIAGNOSIS — O3663X0 Maternal care for excessive fetal growth, third trimester, not applicable or unspecified: Secondary | ICD-10-CM | POA: Diagnosis not present

## 2023-06-18 DIAGNOSIS — O3660X0 Maternal care for excessive fetal growth, unspecified trimester, not applicable or unspecified: Secondary | ICD-10-CM

## 2023-06-19 ENCOUNTER — Encounter (HOSPITAL_COMMUNITY)
Admission: RE | Admit: 2023-06-19 | Discharge: 2023-06-19 | Disposition: A | Discharge status: Home / Self Care | Source: Ambulatory Visit | Attending: Obstetrics and Gynecology | Admitting: Obstetrics and Gynecology

## 2023-06-19 DIAGNOSIS — O24419 Gestational diabetes mellitus in pregnancy, unspecified control: Secondary | ICD-10-CM | POA: Diagnosis not present

## 2023-06-19 DIAGNOSIS — Z01812 Encounter for preprocedural laboratory examination: Secondary | ICD-10-CM | POA: Insufficient documentation

## 2023-06-19 LAB — CBC
HCT: 33.5 % — ABNORMAL LOW (ref 36.0–46.0)
Hemoglobin: 10.9 g/dL — ABNORMAL LOW (ref 12.0–15.0)
MCH: 26.8 pg (ref 26.0–34.0)
MCHC: 32.5 g/dL (ref 30.0–36.0)
MCV: 82.3 fL (ref 80.0–100.0)
Platelets: 209 10*3/uL (ref 150–400)
RBC: 4.07 MIL/uL (ref 3.87–5.11)
RDW: 13 % (ref 11.5–15.5)
WBC: 7.7 10*3/uL (ref 4.0–10.5)
nRBC: 0 % (ref 0.0–0.2)

## 2023-06-19 LAB — TYPE AND SCREEN
ABO/RH(D): O POS
Antibody Screen: NEGATIVE

## 2023-06-19 LAB — RPR: RPR Ser Ql: NONREACTIVE

## 2023-06-21 ENCOUNTER — Inpatient Hospital Stay (HOSPITAL_COMMUNITY)
Admission: RE | Admit: 2023-06-21 | Discharge: 2023-06-24 | DRG: 783 | Disposition: A | Discharge status: Home / Self Care | Attending: Obstetrics and Gynecology | Admitting: Obstetrics and Gynecology

## 2023-06-21 ENCOUNTER — Encounter (HOSPITAL_COMMUNITY): Payer: Self-pay | Admitting: Obstetrics and Gynecology

## 2023-06-21 ENCOUNTER — Inpatient Hospital Stay (HOSPITAL_COMMUNITY): Payer: Self-pay | Admitting: Anesthesiology

## 2023-06-21 ENCOUNTER — Encounter (HOSPITAL_COMMUNITY)
Admission: RE | Disposition: A | Discharge status: Home / Self Care | Payer: Self-pay | Source: Home / Self Care | Attending: Obstetrics and Gynecology

## 2023-06-21 ENCOUNTER — Other Ambulatory Visit: Payer: Self-pay

## 2023-06-21 DIAGNOSIS — R030 Elevated blood-pressure reading, without diagnosis of hypertension: Secondary | ICD-10-CM | POA: Diagnosis not present

## 2023-06-21 DIAGNOSIS — O24414 Gestational diabetes mellitus in pregnancy, insulin controlled: Secondary | ICD-10-CM

## 2023-06-21 DIAGNOSIS — O99210 Obesity complicating pregnancy, unspecified trimester: Secondary | ICD-10-CM | POA: Diagnosis present

## 2023-06-21 DIAGNOSIS — O9832 Other infections with a predominantly sexual mode of transmission complicating childbirth: Secondary | ICD-10-CM | POA: Diagnosis present

## 2023-06-21 DIAGNOSIS — O403XX0 Polyhydramnios, third trimester, not applicable or unspecified: Secondary | ICD-10-CM | POA: Diagnosis not present

## 2023-06-21 DIAGNOSIS — O24424 Gestational diabetes mellitus in childbirth, insulin controlled: Secondary | ICD-10-CM | POA: Diagnosis not present

## 2023-06-21 DIAGNOSIS — A6000 Herpesviral infection of urogenital system, unspecified: Secondary | ICD-10-CM | POA: Diagnosis not present

## 2023-06-21 DIAGNOSIS — Z148 Genetic carrier of other disease: Secondary | ICD-10-CM | POA: Diagnosis not present

## 2023-06-21 DIAGNOSIS — O99214 Obesity complicating childbirth: Secondary | ICD-10-CM | POA: Diagnosis present

## 2023-06-21 DIAGNOSIS — D62 Acute posthemorrhagic anemia: Secondary | ICD-10-CM | POA: Diagnosis not present

## 2023-06-21 DIAGNOSIS — O34211 Maternal care for low transverse scar from previous cesarean delivery: Secondary | ICD-10-CM | POA: Diagnosis not present

## 2023-06-21 DIAGNOSIS — O35BXX0 Maternal care for other (suspected) fetal abnormality and damage, fetal cardiac anomalies, not applicable or unspecified: Secondary | ICD-10-CM | POA: Diagnosis present

## 2023-06-21 DIAGNOSIS — Z3490 Encounter for supervision of normal pregnancy, unspecified, unspecified trimester: Secondary | ICD-10-CM | POA: Diagnosis not present

## 2023-06-21 DIAGNOSIS — E669 Obesity, unspecified: Secondary | ICD-10-CM | POA: Diagnosis present

## 2023-06-21 DIAGNOSIS — Z8249 Family history of ischemic heart disease and other diseases of the circulatory system: Secondary | ICD-10-CM

## 2023-06-21 DIAGNOSIS — O3663X0 Maternal care for excessive fetal growth, third trimester, not applicable or unspecified: Secondary | ICD-10-CM | POA: Diagnosis not present

## 2023-06-21 DIAGNOSIS — O99824 Streptococcus B carrier state complicating childbirth: Secondary | ICD-10-CM | POA: Diagnosis not present

## 2023-06-21 DIAGNOSIS — Z833 Family history of diabetes mellitus: Secondary | ICD-10-CM

## 2023-06-21 DIAGNOSIS — Z302 Encounter for sterilization: Secondary | ICD-10-CM

## 2023-06-21 DIAGNOSIS — N7011 Chronic salpingitis: Secondary | ICD-10-CM | POA: Diagnosis not present

## 2023-06-21 DIAGNOSIS — E66813 Obesity, class 3: Secondary | ICD-10-CM | POA: Diagnosis present

## 2023-06-21 DIAGNOSIS — O2412 Pre-existing diabetes mellitus, type 2, in childbirth: Secondary | ICD-10-CM | POA: Diagnosis present

## 2023-06-21 DIAGNOSIS — O24419 Gestational diabetes mellitus in pregnancy, unspecified control: Secondary | ICD-10-CM | POA: Diagnosis present

## 2023-06-21 DIAGNOSIS — Z3A37 37 weeks gestation of pregnancy: Secondary | ICD-10-CM

## 2023-06-21 DIAGNOSIS — O9081 Anemia of the puerperium: Secondary | ICD-10-CM | POA: Diagnosis not present

## 2023-06-21 DIAGNOSIS — Z98891 History of uterine scar from previous surgery: Principal | ICD-10-CM

## 2023-06-21 DIAGNOSIS — Z8759 Personal history of other complications of pregnancy, childbirth and the puerperium: Secondary | ICD-10-CM

## 2023-06-21 DIAGNOSIS — O99820 Streptococcus B carrier state complicating pregnancy: Secondary | ICD-10-CM | POA: Diagnosis not present

## 2023-06-21 LAB — GLUCOSE, CAPILLARY
Glucose-Capillary: 140 mg/dL — ABNORMAL HIGH (ref 70–99)
Glucose-Capillary: 124 mg/dL — ABNORMAL HIGH (ref 70–99)
Glucose-Capillary: 111 mg/dL — ABNORMAL HIGH (ref 70–99)

## 2023-06-21 SURGERY — Surgical Case
Anesthesia: Spinal | Laterality: Bilateral

## 2023-06-21 MED ORDER — KETOROLAC TROMETHAMINE 30 MG/ML IJ SOLN: 30.0000 mg | Freq: Once | INTRAMUSCULAR | Status: DC

## 2023-06-21 MED ORDER — DEXAMETHASONE SODIUM PHOSPHATE 4 MG/ML IJ SOLN
INTRAMUSCULAR | Status: AC
  Filled 2023-06-21: qty 1

## 2023-06-21 MED ORDER — FENTANYL CITRATE (PF) 100 MCG/2ML IJ SOLN: 25.0000 ug | INTRAMUSCULAR | Status: DC | PRN

## 2023-06-21 MED ORDER — ZOLPIDEM TARTRATE 5 MG PO TABS: 5.0000 mg | ORAL_TABLET | Freq: Every evening | ORAL | Status: DC | PRN

## 2023-06-21 MED ORDER — MORPHINE SULFATE (PF) 0.5 MG/ML IJ SOLN
INTRAMUSCULAR | Status: DC | PRN
  Administered 2023-06-21: 150 ug via INTRATHECAL

## 2023-06-21 MED ORDER — DROPERIDOL 2.5 MG/ML IJ SOLN: 0.6250 mg | Freq: Once | INTRAMUSCULAR | Status: DC | PRN

## 2023-06-21 MED ORDER — NALOXONE HCL 4 MG/10ML IJ SOLN: 1.0000 ug/kg/h | INTRAVENOUS | Status: DC | PRN

## 2023-06-21 MED ORDER — OXYTOCIN-SODIUM CHLORIDE 30-0.9 UT/500ML-% IV SOLN: 2.5000 [IU]/h | INTRAVENOUS | Status: AC

## 2023-06-21 MED ORDER — MEASLES, MUMPS & RUBELLA VAC IJ SOLR: 0.5000 mL | Freq: Once | INTRAMUSCULAR | Status: DC

## 2023-06-21 MED ORDER — ACETAMINOPHEN 10 MG/ML IV SOLN
INTRAVENOUS | Status: DC | PRN
  Administered 2023-06-21: 1000 mg via INTRAVENOUS

## 2023-06-21 MED ORDER — DEXAMETHASONE SODIUM PHOSPHATE 10 MG/ML IJ SOLN
INTRAMUSCULAR | Status: DC | PRN
  Administered 2023-06-21: 4 mg via INTRAVENOUS

## 2023-06-21 MED ORDER — LACTATED RINGERS IV SOLN: INTRAVENOUS | Status: DC | PRN

## 2023-06-21 MED ORDER — IBUPROFEN 600 MG PO TABS
600.0000 mg | ORAL_TABLET | Freq: Four times a day (QID) | ORAL | Status: AC
  Administered 2023-06-21 – 2023-06-24 (×12): 600 mg via ORAL
  Filled 2023-06-21 (×12): qty 1

## 2023-06-21 MED ORDER — MORPHINE SULFATE (PF) 0.5 MG/ML IJ SOLN
INTRAMUSCULAR | Status: AC
  Filled 2023-06-21: qty 10

## 2023-06-21 MED ORDER — FENTANYL CITRATE (PF) 100 MCG/2ML IJ SOLN
INTRAMUSCULAR | Status: DC | PRN
  Administered 2023-06-21: 15 ug via INTRATHECAL

## 2023-06-21 MED ORDER — SIMETHICONE 80 MG PO CHEW
80.0000 mg | CHEWABLE_TABLET | Freq: Three times a day (TID) | ORAL | Status: DC
  Administered 2023-06-22 – 2023-06-24 (×4): 80 mg via ORAL
  Filled 2023-06-21 (×6): qty 1

## 2023-06-21 MED ORDER — PRENATAL MULTIVITAMIN CH
1.0000 | ORAL_TABLET | Freq: Every day | ORAL | Status: DC
  Administered 2023-06-23 – 2023-06-24 (×2): 1 via ORAL
  Filled 2023-06-21 (×3): qty 1

## 2023-06-21 MED ORDER — COCONUT OIL OIL
1.0000 | TOPICAL_OIL | Status: DC | PRN
  Administered 2023-06-23: 1 via TOPICAL

## 2023-06-21 MED ORDER — OXYTOCIN-SODIUM CHLORIDE 30-0.9 UT/500ML-% IV SOLN
INTRAVENOUS | Status: DC | PRN
  Administered 2023-06-21: 300 mL via INTRAVENOUS

## 2023-06-21 MED ORDER — SIMETHICONE 80 MG PO CHEW: 80.0000 mg | CHEWABLE_TABLET | ORAL | Status: DC | PRN

## 2023-06-21 MED ORDER — TRANEXAMIC ACID-NACL 1000-0.7 MG/100ML-% IV SOLN
1000.0000 mg | Freq: Once | INTRAVENOUS | Status: AC
  Administered 2023-06-21: 1000 mg via INTRAVENOUS

## 2023-06-21 MED ORDER — BUPIVACAINE IN DEXTROSE 0.75-8.25 % IT SOLN
INTRATHECAL | Status: DC | PRN
  Administered 2023-06-21: 1.6 mL via INTRATHECAL

## 2023-06-21 MED ORDER — KETOROLAC TROMETHAMINE 30 MG/ML IJ SOLN: 30.0000 mg | Freq: Four times a day (QID) | INTRAMUSCULAR | Status: DC | PRN

## 2023-06-21 MED ORDER — STERILE WATER FOR IRRIGATION IR SOLN
Status: DC | PRN
  Administered 2023-06-21: 1000 mL

## 2023-06-21 MED ORDER — POVIDONE-IODINE 10 % EX SWAB
2.0000 | Freq: Once | CUTANEOUS | Status: AC
  Administered 2023-06-21: 2 via TOPICAL

## 2023-06-21 MED ORDER — DIPHENHYDRAMINE HCL 25 MG PO CAPS: 25.0000 mg | ORAL_CAPSULE | Freq: Four times a day (QID) | ORAL | Status: DC | PRN

## 2023-06-21 MED ORDER — PHENYLEPHRINE HCL-NACL 20-0.9 MG/250ML-% IV SOLN
INTRAVENOUS | Status: DC | PRN
  Administered 2023-06-21: 60 ug/min via INTRAVENOUS

## 2023-06-21 MED ORDER — DIBUCAINE (PERIANAL) 1 % EX OINT: 1.0000 | TOPICAL_OINTMENT | CUTANEOUS | Status: DC | PRN

## 2023-06-21 MED ORDER — ACETAMINOPHEN 500 MG PO TABS
1000.0000 mg | ORAL_TABLET | Freq: Four times a day (QID) | ORAL | Status: DC
Start: 2023-06-21 — End: 2023-06-24
  Administered 2023-06-21 – 2023-06-24 (×10): 1000 mg via ORAL
  Filled 2023-06-21 (×10): qty 2

## 2023-06-21 MED ORDER — SENNOSIDES-DOCUSATE SODIUM 8.6-50 MG PO TABS
2.0000 | ORAL_TABLET | ORAL | Status: DC
  Administered 2023-06-22 – 2023-06-24 (×3): 2 via ORAL
  Filled 2023-06-21 (×3): qty 2

## 2023-06-21 MED ORDER — CEFAZOLIN SODIUM-DEXTROSE 3-4 GM/150ML-% IV SOLN
3.0000 g | INTRAVENOUS | Status: AC
  Administered 2023-06-21: 3 g via INTRAVENOUS

## 2023-06-21 MED ORDER — NALOXONE HCL 0.4 MG/ML IJ SOLN: 0.4000 mg | INTRAMUSCULAR | Status: DC | PRN

## 2023-06-21 MED ORDER — CEFAZOLIN SODIUM-DEXTROSE 3-4 GM/150ML-% IV SOLN
INTRAVENOUS | Status: AC
  Filled 2023-06-21: qty 150

## 2023-06-21 MED ORDER — LACTATED RINGERS IV SOLN: INTRAVENOUS | Status: DC

## 2023-06-21 MED ORDER — ENOXAPARIN SODIUM 80 MG/0.8ML IJ SOSY
70.0000 mg | PREFILLED_SYRINGE | INTRAMUSCULAR | Status: DC
  Administered 2023-06-22 – 2023-06-24 (×3): 70 mg via SUBCUTANEOUS
  Filled 2023-06-21 (×3): qty 0.8

## 2023-06-21 MED ORDER — FENTANYL CITRATE (PF) 100 MCG/2ML IJ SOLN
INTRAMUSCULAR | Status: AC
  Filled 2023-06-21: qty 2

## 2023-06-21 MED ORDER — TRANEXAMIC ACID-NACL 1000-0.7 MG/100ML-% IV SOLN
INTRAVENOUS | Status: AC
  Filled 2023-06-21: qty 100

## 2023-06-21 MED ORDER — ONDANSETRON HCL 4 MG/2ML IJ SOLN
INTRAMUSCULAR | Status: DC | PRN
  Administered 2023-06-21: 4 mg via INTRAVENOUS

## 2023-06-21 MED ORDER — ONDANSETRON HCL 4 MG/2ML IJ SOLN
INTRAMUSCULAR | Status: AC
Start: 2023-06-21 — End: ?
  Filled 2023-06-21: qty 2

## 2023-06-21 MED ORDER — SODIUM CHLORIDE 0.9% FLUSH: 3.0000 mL | INTRAVENOUS | Status: DC | PRN

## 2023-06-21 MED ORDER — ONDANSETRON HCL 4 MG/2ML IJ SOLN: 4.0000 mg | Freq: Three times a day (TID) | INTRAMUSCULAR | Status: DC | PRN

## 2023-06-21 MED ORDER — OXYCODONE HCL 5 MG PO TABS
5.0000 mg | ORAL_TABLET | ORAL | Status: DC | PRN
  Filled 2023-06-21: qty 1

## 2023-06-21 MED ORDER — SODIUM CHLORIDE 0.9 % IR SOLN
Status: DC | PRN
  Administered 2023-06-21: 1

## 2023-06-21 MED ORDER — PHENYLEPHRINE 80 MCG/ML (10ML) SYRINGE FOR IV PUSH (FOR BLOOD PRESSURE SUPPORT)
PREFILLED_SYRINGE | INTRAVENOUS | Status: DC | PRN
  Administered 2023-06-21: 160 ug via INTRAVENOUS

## 2023-06-21 MED ORDER — ACETAMINOPHEN 500 MG PO TABS: 1000.0000 mg | ORAL_TABLET | Freq: Four times a day (QID) | ORAL | Status: DC

## 2023-06-21 MED ORDER — DIPHENHYDRAMINE HCL 25 MG PO CAPS: 25.0000 mg | ORAL_CAPSULE | ORAL | Status: DC | PRN

## 2023-06-21 MED ORDER — DIPHENHYDRAMINE HCL 50 MG/ML IJ SOLN: 12.5000 mg | INTRAMUSCULAR | Status: DC | PRN

## 2023-06-21 MED ORDER — WITCH HAZEL-GLYCERIN EX PADS: 1.0000 | MEDICATED_PAD | CUTANEOUS | Status: DC | PRN

## 2023-06-21 MED ORDER — MENTHOL 3 MG MT LOZG: 1.0000 | LOZENGE | OROMUCOSAL | Status: DC | PRN

## 2023-06-21 SURGICAL SUPPLY — 32 items
BENZOIN TINCTURE PRP APPL 2/3 (GAUZE/BANDAGES/DRESSINGS) ×1 IMPLANT
CANISTER PREVENA PLUS 150 (CANNISTER) IMPLANT
CHLORAPREP W/TINT 26ML (MISCELLANEOUS) ×2 IMPLANT
CLAMP UMBILICAL CORD (MISCELLANEOUS) ×2 IMPLANT
CLOTH BEACON ORANGE TIMEOUT ST (SAFETY) ×1 IMPLANT
DRESSING PREVENA PLUS CUSTOM (GAUZE/BANDAGES/DRESSINGS) IMPLANT
DRSG OPSITE POSTOP 4X10 (GAUZE/BANDAGES/DRESSINGS) ×1 IMPLANT
ELECTRODE REM PT RTRN 9FT ADLT (ELECTROSURGICAL) ×1 IMPLANT
EXTRACTOR VACUUM M CUP 4 TUBE (SUCTIONS) IMPLANT
GLOVE BIOGEL PI IND STRL 7.0 (GLOVE) IMPLANT
GLOVE BIOGEL PI IND STRL 7.5 (GLOVE) ×2 IMPLANT
GLOVE ECLIPSE 7.5 STRL STRAW (GLOVE) ×1 IMPLANT
GOWN STRL REUS W/TWL LRG LVL3 (GOWN DISPOSABLE) ×3 IMPLANT
KIT ABG SYR 3ML LUER SLIP (SYRINGE) IMPLANT
MAT PREVALON FULL STRYKER (MISCELLANEOUS) IMPLANT
NDL HYPO 25X5/8 SAFETYGLIDE (NEEDLE) IMPLANT
NEEDLE HYPO 25X5/8 SAFETYGLIDE (NEEDLE) IMPLANT
NS IRRIG 1000ML POUR BTL (IV SOLUTION) ×1 IMPLANT
PACK C SECTION WH (CUSTOM PROCEDURE TRAY) ×1 IMPLANT
PAD OB MATERNITY 4.3X12.25 (PERSONAL CARE ITEMS) ×1 IMPLANT
RETRACTOR TRAXI PANNICULUS (MISCELLANEOUS) IMPLANT
RTRCTR C-SECT PINK 25CM LRG (MISCELLANEOUS) ×1 IMPLANT
STRIP CLOSURE SKIN 1/2X4 (GAUZE/BANDAGES/DRESSINGS) ×1 IMPLANT
SUT PLAIN 0 NONE (SUTURE) ×1 IMPLANT
SUT VIC AB 0 CT1 36 (SUTURE) ×3 IMPLANT
SUT VIC AB 2-0 CT1 TAPERPNT 27 (SUTURE) ×1 IMPLANT
SUT VIC AB 4-0 KS 27 (SUTURE) ×1 IMPLANT
SUTURE PLAIN GUT 2.0 ETHICON (SUTURE) IMPLANT
TOWEL OR 17X24 6PK STRL BLUE (TOWEL DISPOSABLE) ×1 IMPLANT
TRAY FOLEY W/BAG SLVR 14FR LF (SET/KITS/TRAYS/PACK) ×1 IMPLANT
VACUUM CUP M-STYLE MYSTIC II (SUCTIONS) IMPLANT
WATER STERILE IRR 1000ML POUR (IV SOLUTION) ×1 IMPLANT

## 2023-06-21 NOTE — Anesthesia Postprocedure Evaluation (Signed)
 Anesthesia Post Note  Patient: Alicia Terry  Procedure(s) Performed: CESAREAN SECTION, WITH BILATERAL TUBAL LIGATION (Bilateral)     Patient location during evaluation: PACU Anesthesia Type: Spinal Level of consciousness: awake and alert Pain management: pain level controlled Vital Signs Assessment: post-procedure vital signs reviewed and stable Respiratory status: spontaneous breathing, nonlabored ventilation and respiratory function stable Cardiovascular status: blood pressure returned to baseline Postop Assessment: no apparent nausea or vomiting, spinal receding, no headache and no backache Anesthetic complications: no   No notable events documented.  Last Vitals:  Vitals:   06/21/23 1624 06/21/23 1731  BP: 120/87 123/85  Pulse: 65 70  Resp: 20 18  Temp: 36.5 C 36.6 C  SpO2: 96% 98%    Last Pain:  Vitals:   06/21/23 1733  TempSrc:   PainSc: 0-No pain   Pain Goal:                Epidural/Spinal Function Cutaneous sensation: Tingles (06/21/23 1733), Patient able to flex knees: Yes (06/21/23 1733), Patient able to lift hips off bed: Yes (06/21/23 1733), Back pain beyond tenderness at insertion site: No (06/21/23 1733), Progressively worsening motor and/or sensory loss: No (06/21/23 1733), Bowel and/or bladder incontinence post epidural: No (06/21/23 1733)  Rayfield Cairo

## 2023-06-21 NOTE — Transfer of Care (Signed)
 Immediate Anesthesia Transfer of Care Note  Patient: Alicia Terry  Procedure(s) Performed: CESAREAN SECTION, WITH BILATERAL TUBAL LIGATION (Bilateral)  Patient Location: PACU  Anesthesia Type:Spinal  Level of Consciousness: awake  Airway & Oxygen Therapy: Patient Spontanous Breathing  Post-op Assessment: Report given to RN  Post vital signs: Reviewed and stable  Last Vitals:  Vitals Value Taken Time  BP    Temp    Pulse    Resp    SpO2      Last Pain:  Vitals:   06/21/23 1028  TempSrc: Oral  PainSc:          Complications: No notable events documented.

## 2023-06-21 NOTE — Discharge Summary (Signed)
 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 yo [id number] at [redacted]w[redacted]d was admitted to the hospital 06/21/2023 for scheduled cesarean section with the following indication:Elective Repeat.Delivery details are as follows:  Membrane Rupture Time/Date: 1:30 PM,06/21/2023  Delivery Method:C-Section, Vacuum Assisted Operative Delivery:Device used:Mityvac Details of operation can be found in separate operative note.  Patient had a postpartum course complicated by elevated blood sugars and was continued on her metformin  500mg  BID. She was also noted to have 2 elevated blood pressures and was started on Procardia  30mg .  She is ambulating, tolerating a regular diet, passing flatus, and urinating well. Patient is discharged home in stable condition on  06/24/23        Newborn Data: Birth date:06/21/2023 Birth time:1:31 PM Gender:Female-Romie Living status:Living Apgars:9 ,9  Weight:4410 g    Magnesium  Sulfate received: No BMZ received: No Rhophylac:N/A MMR:N/A T-DaP:Given prenatally Flu: No RSV Vaccine received: No Transfusion:No  Immunizations received: Immunization History   Administered Date(s) Administered   Tdap 07/31/2017, 11/20/2021    Physical exam  Vitals:   06/21/23 1028  BP: 137/86  Pulse: 91  Resp: 18  Temp: 98.1 F (36.7 C)  TempSrc: Oral  SpO2: 97%  Weight: 134.8 kg  Height: 5\' 8"  (1.727 m)   General: alert, cooperative, and no distress Lochia: appropriate Uterine Fundus: firm Incision: Drain in place DVT Evaluation: No significant calf/ankle edema. Labs: Lab Results  Component Value Date   WBC 7.7 06/19/2023   HGB 10.9 (L) 06/19/2023   HCT 33.5 (L) 06/19/2023   MCV 82.3 06/19/2023   PLT 209 06/19/2023      Latest Ref Rng & Units 12/17/2022   10:56 AM  CMP  Glucose 70 - 99 mg/dL 147   BUN 6 - 20 mg/dL 7   Creatinine 8.29 - 5.62 mg/dL 1.30   Sodium 865 - 784 mmol/L 137   Potassium 3.5 - 5.2 mmol/L 4.2   Chloride 96 - 106 mmol/L 103   CO2 20 - 29 mmol/L 18   Calcium 8.7 - 10.2 mg/dL 9.6   Total Protein 6.0 - 8.5 g/dL 6.6   Total Bilirubin 0.0 - 1.2 mg/dL 0.3   Alkaline Phos 44 - 121 IU/L 52   AST 0 - 40 IU/L 17   ALT 0 - 32 IU/L 16    Edinburgh Score:    03/07/2022   10:37 AM  Edinburgh Postnatal Depression Scale Screening Tool  I have been able to laugh and see the funny side of things. 0  I  have looked forward with enjoyment to things. 0  I have blamed myself unnecessarily when things went wrong. 0  I have been anxious or worried for no good reason. 0  I have felt scared or panicky for no good reason. 0  Things have been getting on top of me. 0  I have been so unhappy that I have had difficulty sleeping. 0  I have felt sad or miserable. 0  I have been so unhappy that I have been crying. 0  The thought of harming myself has occurred to me. 0  Edinburgh Postnatal Depression Scale Total 0   No data recorded  After visit meds:  Allergies as of 06/24/2023   No Known Allergies      Medication List     STOP taking these medications    Accu-Chek Softclix Lancets lancets   aspirin  EC 81 MG tablet    insulin  glargine 100 UNIT/ML Solostar Pen Commonly known as: LANTUS    Insulin  Pen Needle 32G X 4 MM Misc       TAKE these medications    Accu-Chek Guide Test test strip Generic drug: glucose blood Use four times per day as instructed   Dexcom G7 Sensor Misc Use one sensor every 10 days as instructed   Ferric Maltol  30 MG Caps Take 1 capsule (30 mg total) by mouth 2 (two) times daily. Please take one hour before breakfast and dinner   ibuprofen  600 MG tablet Commonly known as: ADVIL  Take 1 tablet (600 mg total) by mouth every 6 (six) hours.   metFORMIN  500 MG tablet Commonly known as: GLUCOPHAGE  Take 1 tablet (500 mg total) by mouth 2 (two) times daily with a meal. What changed: how much to take   NIFEdipine  30 MG 24 hr tablet Commonly known as: Procardia  XL Take 1 tablet (30 mg total) by mouth daily.   PrePLUS 27-1 MG Tabs Take 1 tablet by mouth daily.           Discharge home in stable condition Infant Feeding: Breast Infant Disposition:NICU Discharge instruction: per After Visit Summary and Postpartum booklet. Activity: Advance as tolerated. Pelvic rest for 6 weeks.  Diet: carb modified diet Future Appointments:No future appointments. Follow up Visit: Message sent to [facility] 5/16  Please schedule this patient for a In person postpartum visit in 6 weeks with the following provider: Any provider. Additional Postpartum F/U:2 hour GTT and Incision check 1 week  High risk pregnancy complicated by: GDM Delivery mode:  C-Section, Vacuum Assisted Anticipated Birth Control:  BTL done [facility]   06/21/2023 Melanie Spires, MD  Kraig Peru MSN, CNM Advanced Practice Provider, Center for [facility] 06/24/2023 7:01 AM

## 2023-06-21 NOTE — Lactation Note (Signed)
 This note was copied from a baby's chart. Lactation Consultation Note  Patient Name: Alicia Terry [id number] Date: 06/21/2023 Age:33 years Reason for consult: Initial assessment;Early term 37-38.6wks;Maternal endocrine disorder (See [name]: MR GDm, C/S delivery and infant LGA greater than 9 lbs at 37 weeks.)  Infant has low blood sugar of 28 mg/dl. [name] requested donor breast milk but [name] is currently out of stock, order placed for [name] to receive donor breast milk in the morning. [name] will continue to breast feed infant and supplement infant with any EBM first and then formula tonight. Prior to latching infant at the breast, [name] expressed 3 mls of colostrum that was spoon fed to infant due infant being sleepy, afterwards infant was more alert and starting cuing. [name] latched infant on her right breast and infant breastfeed for 9 minutes before becoming sleepy afterwards infant was pace feed and consumed 15 mls of 20 kcal formula. LC set [name] up with DEBP to help stimulate and establish her milk supply. LC discussed importance of maternal rest, meals and snacks. [name] was made aware of O/P services, breastfeeding support groups, community resources, and our phone # for post-discharge questions.    [name] understand that her EBM is safe for 4 hours at room temperature whereas formula once open must be used within 1 hour.   Current feeding plan:  1- [name] will continue to breastfeed infant by cues, on demand, every 2-3 hours, skin to skin.  2- Afterwards [name] will continue to supplement infant with any EBM first and then offer formula until she receives donor breast milk. 3- [name] will continue to pump every 3 hours for 15 minutes on initial setting.  Maternal Data Has patient been taught Hand Expression?: Yes Does the patient have breastfeeding experience prior to this delivery?: Yes How long did the patient breastfeed?: Per [name] she pumped and breastfeed her 24 month old for 5 months.  Feeding Mother's  Current Feeding Choice: Breast Milk and Donor Milk  LATCH Score Latch: Grasps breast easily, tongue down, lips flanged, rhythmical sucking.  Audible Swallowing: A few with stimulation  Type of Nipple: Everted at rest and after stimulation  Comfort (Breast/Nipple): Soft / non-tender  Hold (Positioning): Assistance needed to correctly position infant at breast and maintain latch.  LATCH Score: 8   Lactation Tools Discussed/Used Tools: Pump;Flanges Flange Size: 24 Breast pump type: Double-Electric Breast Pump Pump Education: Setup, frequency, and cleaning;Milk Storage Reason for Pumping: Infant is ETI, LGA and having low blood glucose levels. Pumping frequency: [name] will continue to pump every 3 hours for 15 minutes on inital setting.  Interventions Interventions: Breast feeding basics reviewed;Assisted with latch;Skin to skin;Breast compression;Adjust position;Support pillows;Position options;Expressed milk;Hand express;Breast massage;DEBP;Education;Pace feeding;Guidelines for Milk Supply and Pumping Schedule Handout;LC Services brochure;CDC milk storage guidelines;CDC Guidelines for Breast Pump Cleaning  Discharge Pump: DEBP;Personal  Consult Status Consult Status: Follow-up Date: 06/22/23 Follow-up type: In-patient    Alicia Terry 06/21/2023, 6:37 PM

## 2023-06-21 NOTE — Anesthesia Procedure Notes (Signed)
 Spinal  Patient location during procedure: OR Start time: 06/21/2023 12:55 PM End time: 06/21/2023 12:58 PM Reason for block: surgical anesthesia Staffing Performed: anesthesiologist  Anesthesiologist: Vernadine Golas, MD Performed by: Vernadine Golas, MD Authorized by: Vernadine Golas, MD   Preanesthetic Checklist Completed: patient identified, IV checked, risks and benefits discussed, monitors and equipment checked, pre-op evaluation and timeout performed Spinal Block Patient position: sitting Prep: DuraPrep and site prepped and draped Patient monitoring: heart rate, continuous pulse ox and blood pressure Approach: midline Location: L3-4 Injection technique: single-shot Needle Needle type: Pencan  Needle gauge: 24 G Needle length: 10 cm Assessment Sensory level: T4 Events: CSF return Additional Notes Risks, benefits, and alternative discussed. Patient gave consent to procedure. Prepped and draped in sitting position. Clear CSF obtained after one needle pass. Positive terminal aspiration. No pain or paraesthesias with injection. Patient tolerated procedure well. Vital signs stable. Amador Junes, MD

## 2023-06-21 NOTE — Op Note (Signed)
 Cesarean Section Operative Report  Alicia Terry  06/21/2023  Indications: history one prior cesarean, maternal request, undesired fertility  Pre-operative Diagnosis: repeat low transverse cesarean section, bilateral salpingectomy  Post-operative Diagnosis: Same   Surgeon: Surgeons and Role:    * Vic Esco, Haynes Lips, MD - Primary    * Melanie Spires, MD - Assisting   Attending Attestation: I was present and scrubbed for the entire procedure.   An experienced assistant was required given the standard of surgical care given the complexity of the case.  This assistant was needed for exposure, dissection, suctioning, retraction, instrument exchange, assisting with delivery with administration of fundal pressure, and for overall help during the procedure.  Anesthesia: spinal    Quantified Blood Loss: 609 ml  Total IV Fluids: 1500 ml LR  Urine Output:: 125 ml clear yellow urine  Specimens: none  Findings: Viable female infant in cephalic presentation; Apgars pending; weight pending; arterial cord pH not obtained;  clear amniotic fluid; intact placenta with three vessel cord; normal uterus, fallopian tubes and ovaries bilaterally.  Baby condition / location:  Couplet care / Skin to Skin   Complications: no complications  Indications: Alicia Terry is a 33 y.o. [phone] with an IUP [redacted]w[redacted]d presenting for scheduled repeat cesarean at this gestational age.  The risks, benefits, complications, treatment options, and exected outcomes were discussed with the patient . The patient dwith the proposed plan, giving informed consent. identified as Alicia Terry and the procedure verified as C-Section Delivery.  Procedure Details:  The patient was taken back to the operative suite where spinal anesthesia was placed.  A time out was held and the above information confirmed.   Tranexamic acid  given for prophylaxis.  After induction of anesthesia, the patient was draped and  prepped in the usual sterile manner and placed in a dorsal supine position with a leftward tilt. A Pfannenstiel incision was made and carried down through the subcutaneous tissue to the fascia. Fascial incision was made and sharply extended transversely. The fascia was separated from the underlying rectus tissue superiorly and inferiorly. The peritoneum was identified and bluntly entered and extended longitudinally. Alexis retractor was placed. A bladder flap was not created. A low transverse uterine incision was made and extended bluntly. Delivered from cephalic presentation with vacuum assistance (one tug, no pop-offs) was a large viable infant with Apgars and weight as above.  After waiting 60 seconds for delayed cord cutting, the umbilical cord was clamped and cut cord blood was obtained for evaluation. Cord ph was not sent. The placenta was removed Intact and appeared normal. The uterine outline, tubes and ovaries appeared normal. The uterine incision was closed with running unlocked sutures 0-Vicryl in one layer.   Hemostasis was observed.   Attention was then turned to the right fallopian tube. Kelly forceps were placed on the mesosalpinx underneath most of the tube.  This pedicle was double suture ligated with 2-0 plain gut, and the tube including the fimbriated end was excised.  The left fallopian tube was then identified, doubly ligated, and was excised in a similar fashion allowing for bilateral tubal sterilization via bilateral salpingectomy.  Good hemostasis was noted overall.  The peritoneum was closed with 2-0 vicryl. The rectus muscles were examined and hemostasis observed. The fascia was then reapproximated with running sutures of 0-Vicryl. The subcuticular closure was performed with 2-0 plain gut. The skin was closed with 4-0 Vicryl.  Prevena wound vacuum was applied.  Instrument, sponge, and needle counts were correct  prior the abdominal closure and were correct at the conclusion of the  case.     Disposition: PACU - hemodynamically stable.   Maternal Condition: stable       Signed: Raymonde Calico, MD 06/21/2023 2:24 PM

## 2023-06-21 NOTE — H&P (Addendum)
 LABOR AND DELIVERY ADMISSION HISTORY AND PHYSICAL NOTE  TRESSIE NEIMEYER is a 33 y.o. female [phone] with IUP at [redacted]w[redacted]d by 10 wk u/s presenting for scheduled elective repeat cesarean.   She reports positive fetal movement. She denies leakage of fluid or vaginal bleeding.  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   pos HSV 1 IgG   Pregnancy induced hypertension     Past Surgical History: Past Surgical History:  Procedure Laterality Date   CESAREAN SECTION N/A 01/23/2022   Procedure: CESAREAN SECTION;  Surgeon: Julianne Octave, MD;  Location: [facility] LD ORS;  Service: Obstetrics;  Laterality: N/A;   CYST EXCISION     Chest   WISDOM TOOTH EXTRACTION      Obstetrical History: OB History     Gravida  4   Para  2   Term  1   Preterm  1   AB  1   Living  1      SAB  0   IAB  0   Ectopic  1   Multiple  0   Live Births  1           Social History: Social History   Socioeconomic History   Marital status: Single    Spouse name: Not on file   Number of children: Not on file   Years of education: Not on file   Highest education level: Not on file  Occupational History   Occupation: Beautician  Tobacco Use   Smoking status: Never   Smokeless tobacco: Never  Vaping Use   Vaping status: Never Used  Substance and Sexual Activity   Alcohol use: Not Currently   Drug use: Not Currently    Types: Marijuana    Comment: last use may 2023   Sexual activity: Yes    Comment: sexually active yesterday  Other Topics Concern   Not on file  Social History Narrative   Not on file   Social Drivers of Health   Financial Resource Strain: Low Risk  (08/05/2020)   Overall Financial Resource Strain (CARDIA)    Difficulty of Paying Living Expenses: Not  hard at all  Food Insecurity: No Food Insecurity (06/21/2023)   Hunger Vital Sign    Worried About Running Out of Food in the Last Year: Never true    Ran Out of Food in the Last Year: Never true  Transportation Needs: No Transportation Needs (06/21/2023)   PRAPARE - Administrator, Civil Service (Medical): No    Lack of Transportation (Non-Medical): No  Physical Activity: Sufficiently Active (08/05/2020)   Exercise Vital Sign    Days of Exercise per Week: 7 days    Minutes of Exercise per Session: 30 min  Stress: No Stress Concern Present (08/05/2020)   [employer] of Occupational Health - Occupational Stress Questionnaire    Feeling of Stress : Only a little  Social Connections: Socially Isolated (08/05/2020)   Social Connection and Isolation Panel [NHANES]    Frequency of Communication with Friends and Family: Twice a week    Frequency of Social Gatherings with Friends and Family: Twice a week    Attends Religious Services: Never    Database administrator or Organizations: No  Attends Banker Meetings: Never    Marital Status: Never married    Family History: Family History  Problem Relation Age of Onset   Diabetes Mother    Hypertension Mother    Healthy Father    Diabetes Sister    Heart disease Maternal Grandmother    Hypertension Maternal Grandmother    Depression Maternal Grandmother    Heart disease Maternal Grandfather    Hypertension Maternal Grandfather    Depression Maternal Grandfather    Dementia Paternal Grandmother     Allergies: No Known Allergies  Medications Prior to Admission  Medication Sig Dispense Refill Last Dose/Taking   aspirin  EC 81 MG tablet Take 2 tablets (162 mg total) by mouth daily. Start taking when you are [redacted] weeks pregnant for rest of pregnancy for prevention of preeclampsia 300 tablet 2 Past Week   Ferric Maltol  30 MG CAPS Take 1 capsule (30 mg total) by mouth 2 (two) times daily. Please take one hour before  breakfast and dinner 60 capsule 2 Past Week   insulin  glargine (LANTUS ) 100 UNIT/ML Solostar Pen Inject 5 Units into the skin at bedtime.   06/20/2023   metFORMIN  (GLUCOPHAGE ) 500 MG tablet Take 1 tablet (500 mg total) by mouth 2 (two) times daily with a meal. (Patient taking differently: Take 1,000 mg by mouth 2 (two) times daily with a meal.) 60 tablet 5 Past Week   Prenatal Vit-Fe Fumarate-FA (PREPLUS) 27-1 MG TABS Take 1 tablet by mouth daily. 30 tablet 11 Past Week   Accu-Chek Softclix Lancets lancets Use four times per day as instructed 100 each 12    Continuous Glucose Sensor (DEXCOM G7 SENSOR) MISC Use one sensor every 10 days as instructed 9 each 3    glucose blood (ACCU-CHEK GUIDE TEST) test strip Use four times per day as instructed 100 each 12    Insulin  Pen Needle 32G X 4 MM MISC Please use as directed 100 each 1      Review of Systems   All systems reviewed and negative except as stated in HPI  Blood pressure 137/86, pulse 91, temperature 98.1 F (36.7 C), temperature source Oral, resp. rate 18, height 5\' 8"  (1.727 m), weight 134.8 kg, last menstrual period 09/30/2022, SpO2 97%, unknown if currently breastfeeding. General appearance: alert, cooperative, and appears stated age Lungs: clear to auscultation bilaterally Heart: regular rate and rhythm Abdomen: soft, non-tender; bowel sounds normal Extremities: No calf swelling or tenderness Presentation: cephalic Fetal monitoring: 140s Uterine activity: quiet     Prenatal labs: ABO, Rh: --/--/O POS (05/14 1610) Antibody: NEG (05/14 9604) Rubella: 2.30 (11/11 1056) RPR: NON REACTIVE (05/14 1000)  HBsAg: Negative (11/11 1056)  HIV: Non Reactive (03/19 1636)  GBS: Positive/-- (05/05 1103)  2 hr Glucola: abnormal Genetic screening:  normal Anatomy US : normal  Prenatal Transfer Tool  Maternal Diabetes: Yes:  Diabetes Type:  Insulin /Medication controlled Genetic Screening: Normal Maternal Ultrasounds/Referrals:  Normal Fetal Ultrasounds or other Referrals:  Fetal echo - vsd Maternal Substance Abuse:  No Significant Maternal Medications:  insulin , metformin  Significant Maternal Lab Results: Group B Strep positive  Results for orders placed or performed during the hospital encounter of 06/21/23 (from the past 24 hours)  Glucose, capillary   Collection Time: 06/21/23 10:26 AM  Result Value Ref Range   Glucose-Capillary 140 (H) 70 - 99 mg/dL    Patient Active Problem List   Diagnosis Date Noted   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 [id number] at [redacted]w[redacted]d here for elective repeat cesarean section and tubal sterilization. We reviewed risks benefits of TOLAC versus repeat section, taking into account EFW and desire for tubal sterilization. Patient affirms her desire to proceed with elective repeat cesarean section.  The risks of cesarean section were discussed with the patient including but were not limited to: bleeding which may require transfusion or reoperation; infection which may require antibiotics; injury to bowel, bladder, ureters or other surrounding organs; injury to the fetus; need for additional procedures including hysterectomy in the event of a life-threatening hemorrhage; placental abnormalities wth subsequent pregnancies, incisional problems, thromboembolic phenomenon and other postoperative/anesthesia complications.  Patient also desires permanent sterilization.  Other  reversible forms of contraception were discussed with patient; she declines all other modalities. Risks of procedure discussed with patient including but not limited to: risk of regret, permanence of method, bleeding, infection, injury to surrounding organs and need for additional procedures.  Failure risk of 1-2% with increased risk of ectopic gestation if pregnancy occurs was also discussed with patient.  The patient concurred with the proposed plan, giving informed written consent for the procedures. She elects salpingectomy and is aware it is irreversible. Patient has been NPO since midnight she will remain NPO for procedure. Anesthesia and OR aware.  Preoperative prophylactic antibiotics and SCDs ordered on call to the OR.  To OR when ready.  # Polyhydramnios: noted  # LGA fetus: extrapolated EFW 4035 grams  # A2GDM, possible pre-gestational DM: glucose 124, will plan on monitoring fastings postpartum. GDM uncontrolled with poly and LGA, hence delivery today at 37 weeks. Will plan on holding meds postpartum and checking daily fasting sugars.  # Fetal VSD: peds notified via transfer tool of need for neonatal echo  # history prior section: elevated risk of hemorrhage, verbally consented for blood, will plan on txa ppx  # ID: 3 g ancef , plan on wound vac as well  # circ: n/a  # feeding: breast  Albina Hull Gwenetta Devos 06/21/2023, 11:31 AM

## 2023-06-22 ENCOUNTER — Encounter (HOSPITAL_COMMUNITY): Payer: Self-pay | Admitting: Obstetrics and Gynecology

## 2023-06-22 LAB — CBC
HCT: 29.2 % — ABNORMAL LOW (ref 36.0–46.0)
Hemoglobin: 9.4 g/dL — ABNORMAL LOW (ref 12.0–15.0)
MCH: 26.6 pg (ref 26.0–34.0)
MCHC: 32.2 g/dL (ref 30.0–36.0)
MCV: 82.7 fL (ref 80.0–100.0)
Platelets: 182 10*3/uL (ref 150–400)
RBC: 3.53 MIL/uL — ABNORMAL LOW (ref 3.87–5.11)
RDW: 13.1 % (ref 11.5–15.5)
WBC: 9.1 10*3/uL (ref 4.0–10.5)
nRBC: 0 % (ref 0.0–0.2)

## 2023-06-22 LAB — COMPREHENSIVE METABOLIC PANEL WITH GFR
ALT: 13 U/L (ref 0–44)
AST: 17 U/L (ref 15–41)
Albumin: 2.4 g/dL — ABNORMAL LOW (ref 3.5–5.0)
Alkaline Phosphatase: 93 U/L (ref 38–126)
Anion gap: 8 (ref 5–15)
BUN: 8 mg/dL (ref 6–20)
CO2: 21 mmol/L — ABNORMAL LOW (ref 22–32)
Calcium: 8.7 mg/dL — ABNORMAL LOW (ref 8.9–10.3)
Chloride: 102 mmol/L (ref 98–111)
Creatinine, Ser: 0.71 mg/dL (ref 0.44–1.00)
GFR, Estimated: >60 mL/min (ref >60)
Glucose, Bld: 185 mg/dL — ABNORMAL HIGH (ref 70–99)
Potassium: 4.1 mmol/L (ref 3.5–5.1)
Sodium: 131 mmol/L — ABNORMAL LOW (ref 135–145)
Total Bilirubin: 0.6 mg/dL (ref 0.0–1.2)
Total Protein: 5 g/dL — ABNORMAL LOW (ref 6.5–8.1)

## 2023-06-22 MED ORDER — POTASSIUM CHLORIDE CRYS ER 20 MEQ PO TBCR
20.0000 meq | EXTENDED_RELEASE_TABLET | Freq: Every day | ORAL | Status: DC
  Administered 2023-06-22 – 2023-06-24 (×3): 20 meq via ORAL
  Filled 2023-06-22 (×3): qty 1

## 2023-06-22 MED ORDER — FUROSEMIDE 20 MG PO TABS
20.0000 mg | ORAL_TABLET | Freq: Every day | ORAL | Status: DC
  Administered 2023-06-22 – 2023-06-24 (×3): 20 mg via ORAL
  Filled 2023-06-22 (×3): qty 1

## 2023-06-22 NOTE — Lactation Note (Signed)
 This note was copied from a baby's chart.  NICU Lactation Consultation Note  Patient Name: Alicia Terry [id number] Date: 06/22/2023 Age:35 hours  Reason for consult: Follow-up assessment; NICU baby; Early term 16-38.6wks; Maternal endocrine disorder; Other (Comment) (Transfer from the MBU, LGA) Type of Endocrine Disorder?: Diabetes (A2GDM (metformin , insulin ))  SUBJECTIVE Visited with family of 88 82/61 weeks old NICU female; baby "Alicia Terry" got admitted due to hypoglycemia. Alicia Terry is a P2 and has some experience breastfeeding; her plan with this baby is to keep her on breastmilk if possible, she's on 24 calorie formula due to hypoglycemia; parents are amenable to try bottles and do what is best for baby. Alicia Terry started pumping yesterday with a hand pump, she voiced she liked it better than the DEBP because she got "more" with the hand pump. Explained that the purpose of pumping this early on is mainly for for breast stimulation and not to get volume; and that it is common to see these variations in her output during the first 3-5 days; praised her for all her efforts. Reviewed pumping schedule, pump settings, lactogenesis II/III, pump ranking and anticipatory guidelines. This LC also offered a Stork pump but she said she would like to think about it, her MomCozy pump was a gift, encouraged to get an insurance pump that is not a wearable one (wall pump).  OBJECTIVE Infant data: Mother's Current Feeding Choice: Breast Milk and Formula  O2 Device: Room Air  Infant feeding assessment IDFTS - Readiness: 2 IDFTS - Quality: 2   Maternal data: [id number] C-Section, Vacuum Assisted Has patient been taught Hand Expression?: Yes Hand Expression Comments: LC used breast model and [name] expressed 3 mls of EBM that was spoon fed to infant prior to latching infant at the breast, due infant being sleepy and having blood glucose of 28 mg/ dl. Significant Breast History:: (+) breast changes during  the pregnancy Current breast feeding challenges:: NICU admission Does the patient have breastfeeding experience prior to this delivery?: Yes How long did the patient breastfeed?: Per [name] she pumped and breastfeed her 52 month old for 5 months. Pumping frequency: 3 times/24 hours Pumped volume: 0 mL (drops) Flange Size: 24 Risk factor for low/delayed milk supply:: A2GDM, BMI = 45, infant separation  Pump: Personal, Hands Free (MomCozy wearable)  ASSESSMENT Infant: Feeding Status: Ad lib Feeding method: Bottle Nipple Type: Dr. Michelene Ahmadi Preemie  Maternal: Milk volume: Normal  INTERVENTIONS/PLAN Interventions: Interventions: Breast feeding basics reviewed; DEBP; Education; NICU Pumping Log Tools: Pump; Flanges Pump Education: Setup, frequency, and cleaning; Milk Storage  Plan: STS around care times Pump both breasts on initiate mode every 3 hours for 15 minutes, ideally 8 pumping sessions/24 hours Continue taking "Alicia Terry" to breast on feeding cues, around feeding times and call for assistance PRN  MGM present and supportive. All questions and concerns answered, family to contact [facility] services PRN.  Consult Status: NICU follow-up NICU Follow-up type: New admission follow up   Margretta Zamorano S Clementine Cutting 06/22/2023, 2:53 PM

## 2023-06-22 NOTE — Progress Notes (Signed)
 POSTPARTUM PROGRESS NOTE  Subjective: Alicia Terry is a 33 y.o. [id number] s/p rLTCS at [redacted]w[redacted]d.  She reports she is doing well. No acute events overnight. She denies any problems with ambulating, or po intake. Foley recently out; has not urinated on her own yet. Denies nausea or vomiting. She has passed flatus. Pain is well controlled.  Lochia is moderate.  Objective: Blood pressure 119/64, pulse 95, temperature 98 F (36.7 C), temperature source Oral, resp. rate 18, height 5\' 8"  (1.727 m), weight 134.8 kg, last menstrual period 09/30/2022, SpO2 95%, unknown if currently breastfeeding.  Physical Exam:  General: alert, cooperative and no distress Chest: no respiratory distress Abdomen: soft, non-tender  Uterine Fundus: firm and at level of umbilicus Extremities: No calf swelling or tenderness  Trace edema  Recent Labs    06/19/23 1000 06/22/23 0410  HGB 10.9* 9.4*  HCT 33.5* 29.2*    Assessment/Plan: Alicia Terry is a 33 y.o. [id number] s/p rLTCS at [redacted]w[redacted]d for A2GDM/possible T2DM.  Routine Postpartum Care: Doing well, pain well-controlled.  -- Continue routine care, lactation support  -- Contraception: BTS -- Feeding: breast  -- A2GDM/possible T2DM: Repeat fasting CBG in the AM. If still elevated, consider sending home on metformin   Dispo: Plan for discharge 5/18-5/19.  Maud Sorenson, MD OB Fellow 06/22/2023 9:43 AM

## 2023-06-23 LAB — GLUCOSE, CAPILLARY: Glucose-Capillary: 208 mg/dL — ABNORMAL HIGH (ref 70–99)

## 2023-06-23 MED ORDER — FERROUS SULFATE 325 (65 FE) MG PO TABS
325.0000 mg | ORAL_TABLET | ORAL | Status: DC
  Administered 2023-06-23: 325 mg via ORAL
  Filled 2023-06-23: qty 1

## 2023-06-23 MED ORDER — METFORMIN HCL 500 MG PO TABS
500.0000 mg | ORAL_TABLET | Freq: Two times a day (BID) | ORAL | Status: DC
  Administered 2023-06-23 – 2023-06-24 (×3): 500 mg via ORAL
  Filled 2023-06-23 (×3): qty 1

## 2023-06-23 NOTE — Lactation Note (Signed)
 This note was copied from a baby's chart.  NICU Lactation Consultation Note  Patient Name: Alicia Terry [id number] Date: 06/23/2023 Age:35 hours  Reason for consult: Follow-up assessment; NICU baby; Early term 19-38.6wks; Maternal endocrine disorder; Other (Comment) (Transfer from the MBU, LGA) Type of Endocrine Disorder?: Diabetes (metformin , insulin )  SUBJECTIVE Visited with family of 59 hours old ETI NICU female "Alicia Terry"' Alicia Terry reported she's been pumping and getting small amounts of colostrum, praised her for her efforts. Noticed that pumping is still not consistent, she's expecting to be discharged from the [facility] tomorrow. Reviewed discharge education, pump settings and the importance of consistent pumping for the onset of lactogenesis II and the prevention of engorgement. She inquired about how to get a hospital grade pump, faxed a WIC referral to [facility] per her request (she lives in [place], see maternal data).   OBJECTIVE Infant data: Mother's Current Feeding Choice: Breast Milk and Donor Milk  O2 Device: Room Air  Infant feeding assessment IDFTS - Readiness: 1 IDFTS - Quality: 2   Maternal data: [id number] C-Section, Vacuum Assisted Significant Breast History:: (+) breast changes during the pregnancy Current breast feeding challenges:: NICU admission Pumping frequency: 3-4 times/24 hours Pumped volume: 1 mL (1-2 ml) Flange Size: 24 Risk factor for low/delayed milk supply:: A2GDM, BMI = 45, infant separation  WIC Program: Yes WIC Referral Sent?: Yes What county?: [place] ([name] requested WIC referral to be sent to [employer] because she'll be staying with her mom (she has a Paediatric nurse)) Pump: Personal, Hands Free (MomCozy wearable)  ASSESSMENT Infant: Feeding Status: Ad lib Feeding method: Bottle Nipple Type: Nfant Standard Flow (white)  Maternal: Milk volume: Normal  INTERVENTIONS/PLAN Interventions: Interventions:  Breast feeding basics reviewed; Coconut oil; DEBP; Education Discharge Education: Engorgement and breast care Tools: Coconut oil Pump Education: Setup, frequency, and cleaning; Milk Storage  Plan: STS around care times Pump both breasts on initiate mode every 3 hours for 15 minutes, ideally 8 pumping sessions/24 hours Bring all pump pieces to baby's room after her discharge Switch to maintain mode once expressing 20 ml of EBM combined or by day 5 Continue taking "Alicia Terry" to breast on feeding cues, around feeding times and call for assistance PRN   No other support person at this time. All questions and concerns answered, family to contact [facility] services PRN.  Consult Status: NICU follow-up NICU Follow-up type: Maternal D/C visit; Verify absence of engorgement; Verify onset of copious milk   Alicia Terry 06/23/2023, 6:03 PM

## 2023-06-23 NOTE — Plan of Care (Signed)

## 2023-06-23 NOTE — Progress Notes (Signed)
 Writer notified provider that patients fasting glucose was 208. No new orders were given

## 2023-06-23 NOTE — Progress Notes (Addendum)
 POSTPARTUM PROGRESS NOTE  POD #2  Subjective:  Alicia Terry is a 33 y.o. [id number] s/p rLTCS at [redacted]w[redacted]d. No acute events overnight. She reports she is doing well. She denies any problems with ambulating, voiding or po intake. Denies nausea or vomiting. She has passed flatus. Pain is well controlled.  Lochia is appropriate.  Objective: Blood pressure 122/75, pulse 88, temperature 98.1 F (36.7 C), temperature source Oral, resp. rate 18, height 5\' 8"  (1.727 m), weight 134.8 kg, last menstrual period 09/30/2022, SpO2 96%, unknown if currently breastfeeding.  Physical Exam:  General: alert, cooperative and no distress Chest: no respiratory distress Heart: regular rate, distal pulses intact Uterine Fundus: firm, appropriately tender DVT Evaluation: No calf swelling or tenderness Extremities: trace edema Skin: warm, dry; incision clean/dry/intact w/ wound vac and dressing in place  Recent Labs    06/22/23 0410  HGB 9.4*  HCT 29.2*    Assessment/Plan: Alicia Terry is a 33 y.o. A5W0981 s/p rLTCS at [redacted]w[redacted]d for A2GDM/?T2DM.  POD#2 - Doing welll; pain well controlled. H/H appropriate  Routine postpartum care  OOB, ambulated  Lovenox  for VTE prophylaxis Acute blood loss Anemia, clinically significant: asymptomatic  Start po ferrous sulfate  every other day A2GDM/?T2DM: fasting 185 > repeat 208  Restart Metformin  500mg  BID Contraception: BTS Feeding: breast  Dispo: Plan for discharge 5/19, baby in NICU.   LOS: 2 days   Ebony Goldstein, MD OB Fellow  06/23/2023, 5:35 AM

## 2023-06-24 LAB — GLUCOSE, CAPILLARY: Glucose-Capillary: 130 mg/dL — ABNORMAL HIGH (ref 70–99)

## 2023-06-24 MED ORDER — NIFEDIPINE ER OSMOTIC RELEASE 30 MG PO TB24: 30.0000 mg | ORAL_TABLET | Freq: Every day | ORAL | 1 refills | Status: DC

## 2023-06-24 MED ORDER — FUROSEMIDE 20 MG PO TABS: 20.0000 mg | ORAL_TABLET | Freq: Every day | ORAL | 0 refills | Status: DC

## 2023-06-24 MED ORDER — IBUPROFEN 600 MG PO TABS: 600.0000 mg | ORAL_TABLET | Freq: Four times a day (QID) | ORAL | 0 refills | Status: AC

## 2023-06-24 MED ORDER — POTASSIUM CHLORIDE CRYS ER 20 MEQ PO TBCR: 20.0000 meq | EXTENDED_RELEASE_TABLET | Freq: Every day | ORAL | 0 refills | Status: DC

## 2023-06-24 MED ORDER — NIFEDIPINE ER OSMOTIC RELEASE 30 MG PO TB24
30.0000 mg | ORAL_TABLET | Freq: Every day | ORAL | Status: DC
  Administered 2023-06-24: 30 mg via ORAL
  Filled 2023-06-24: qty 1

## 2023-06-24 NOTE — Lactation Note (Addendum)
 This note was copied from a baby's chart. Lactation Consultation Note  Patient Name: Alicia Terry [id number] Date: 06/24/2023 Age:35 hours Reason for consult: Follow-up assessment;Maternal discharge;Early term 37-38.6wks  P2, 37 wks, @ 69 hrs of life. Mom in NICU with baby. Mom on phone with [facility] with LC arrival. A peer counselor will call mom back to see if pump can be picked today. Coconut oil provided to mom- encouraged with each pumping session. Mom has changed to maintain pumping, does well with 4-5 drops of strength. Encouraged mom to bring all pump parts up to NICU with maternal DC. Mom DC anticipated today, per mom- baby DC hoped for tomorrow.  Encouraged mom to keep working on big mouth latch with baby and use EBM or coconut oil after each feed. Discussed cluster feeding overnight/ early morning brings in our milk supply, shared expectations of milk coming in. Highlighted risk of engorgement. Discussed hand pump/express to soften breasts, motrin  as anti-inflammatory, and ice packs for 10-20 minutes post feed/pumping if still over-full is the best treatments for inflamed/engorged breasts. Per mom has a hand pump in her PP room from previous shift, highlighted best for swollen/engorged breast.  Maternal Data Has patient been taught Hand Expression?: Yes Does the patient have breastfeeding experience prior to this delivery?: Yes  Feeding Mother's Current Feeding Choice: Breast Milk and Donor Milk Nipple Type: Nfant Standard Flow (white)  LATCH Score Latch: Grasps breast easily, tongue down, lips flanged, rhythmical sucking.  Audible Swallowing: Spontaneous and intermittent  Type of Nipple: Everted at rest and after stimulation  Comfort (Breast/Nipple): Soft / non-tender  Hold (Positioning): Assistance needed to correctly position infant at breast and maintain latch.  LATCH Score: 9   Lactation Tools Discussed/Used Tools: Coconut oil Pump Education: Milk  Storage  Interventions Interventions: Breast feeding basics reviewed;Assisted with latch;Hand express;Breast compression;Adjust position;Support pillows;Coconut oil;Education;LC Services brochure;CDC milk storage guidelines  Discharge Discharge Education: Engorgement and breast care Pump: Manual;Hands Free;Personal;WIC Pump  Consult Status Status- Follow-up    Alicia Terry 06/24/2023, 11:19 AM

## 2023-06-24 NOTE — Progress Notes (Signed)
 Fasting CBG 130

## 2023-06-24 NOTE — Progress Notes (Addendum)
 POSTPARTUM PROGRESS NOTE  POD #3  Subjective:  Alicia Terry is a 33 y.o. [id number] s/p rLTCS at [redacted]w[redacted]d. No acute events overnight. She reports she is doing well. She denies any problems with ambulating, voiding or po intake. Denies nausea or vomiting. She has passed flatus and urine. Pain is well controlled.  Lochia is normal.  Objective: Blood pressure (!) 144/91, pulse 90, temperature 98 F (36.7 C), temperature source Oral, resp. rate 18, height 5\' 8"  (1.727 m), weight 134.8 kg, last menstrual period 09/30/2022, SpO2 99%, unknown if currently breastfeeding.  Physical Exam:  General: alert, cooperative and no distress Chest: no respiratory distress Heart: regular rate, distal pulses intact Extremities: trace edema Skin: warm, dry; incision clean/dry/intact w/ honeycomb dressing in place  Recent Labs    06/22/23 0410  HGB 9.4*  HCT 29.2*    Assessment/Plan: Alicia Terry is a 33 y.o. G4W1027 s/p rLTCS at [redacted]w[redacted]d for A2GDM/T2DM.  POD#3 - Doing welll; pain is well controlled. H/H appropriate  Routine postpartum care  OOB, ambulated  Lovenox  for VTE prophylaxis Acute Blood Loss Anemia, clinically significant: asymptomatic  Continue po ferrous sulfate  every other day A2GDM/T2DM: Last blood glucose 208  Continue Metformin  500 mg BID Elevated Blood Pressures  Start Procardia  30 mg daily Contraception: Bilateral salpingectomy Feeding: Breast  Dispo: Plan for discharge 5/19 with baby in NICU.   LOS: 3 days   Park Bolk, Medical Student  06/24/2023, 6:09 AM

## 2023-06-24 NOTE — Social Work (Addendum)
 Patient screened out for psychosocial assessment since none of the following apply: Psychosocial stressors documented in mother or baby's chart Gestation less than 32 weeks Code at delivery  Infant with anomalies Please contact the Clinical Social Worker if specific needs arise, by [name]'s request, or if [name] scores greater than 9/yes to question 10 on [place] Postpartum Depression Screen. [name] completed [place] with score of: 1   Nickolas Barr, MSW, [facility] Clinical Social Worker  [phone] 11/23/23  9:37 AM

## 2023-06-25 ENCOUNTER — Ambulatory Visit (HOSPITAL_COMMUNITY): Payer: Self-pay

## 2023-06-25 LAB — SURGICAL PATHOLOGY

## 2023-06-25 NOTE — Lactation Note (Signed)
 This note was copied from a baby's chart.  NICU Lactation Consultation Note  Patient Name: Alicia Terry [id number] Date: 06/25/2023 Age:35 days  Reason for consult: Follow-up assessment; NICU baby; Early term 50-38.6wks; Maternal endocrine disorder; Other (Comment) (GHTN) Type of Endocrine Disorder?: Diabetes (GDM)  SUBJECTIVE  LC in to visit with P2 Mom of baby "Romi" admitted to NICU for low CBGs.  Baby is currently ad lib and to be discharged from the NICU today.  Mom has been latching baby to the breast ad lib, which is her preference.  Latch scores of 9 and 10.  Mom encouraged to feed baby often with feeding cues.  Engorgement prevention and treatment reviewed.  Mom agreed to OP lactation F/U, message sent to OP lactation team.  OBJECTIVE Infant data: Mother's Current Feeding Choice: Breast Milk and Donor Milk  O2 Device: Room Air  Infant feeding assessment IDFTS - Readiness: 1 IDFTS - Quality: 2   Maternal data: [id number] C-Section, Vacuum Assisted Has patient been taught Hand Expression?: Yes Does the patient have breastfeeding experience prior to this delivery?: Yes Pumping frequency: pumping when baby is not breastfeeding, her goal is to exclusively breastfeed Pumped volume: 30 mL Flange Size: 24  WIC Program: Yes WIC Referral Sent?: Yes What county?: [place] ([name] requested WIC referral to be sent to [facility] because she'll be staying with her mom (she has a Paediatric nurse)) Pump: Hands Free, Copywriter, advertising (MomCozy)  ASSESSMENT Infant: Latch: Grasps breast easily, tongue down, lips flanged, rhythmical sucking. Audible Swallowing: A few with stimulation Type of Nipple: Everted at rest and after stimulation Comfort (Breast/Nipple): Soft / non-tender Hold (Positioning): No assistance needed to correctly position infant at breast. LATCH Score: 9  Feeding Status: Ad lib Feeding method: Bottle Nipple Type: Other ([place])  Maternal: Milk volume:  Normal  INTERVENTIONS/PLAN Interventions: Interventions: Breast feeding basics reviewed; Skin to skin; Breast massage; Hand express; DEBP; Education Discharge Education: Outpatient recommendation; Outpatient Epic message sent; Engorgement and breast care; Warning signs for feeding baby Tools: Pump; Flanges Pump Education: Setup, frequency, and cleaning; Milk Storage  Plan: Consult Status: Complete NICU Follow-up type: Baby's discharge   Dario Edison 06/25/2023, 12:56 PM

## 2023-06-28 ENCOUNTER — Telehealth: Payer: Self-pay | Admitting: Lactation Services

## 2023-06-28 ENCOUNTER — Encounter: Payer: Self-pay | Admitting: Certified Nurse Midwife

## 2023-06-28 NOTE — Telephone Encounter (Signed)
 Called and spoke with patient, she was offered to come in today at 10:40, she is not able to make it as child has an appointment at 10:15 this morning in [place]. Reviewed if Provena stops working, to turn off. If motor detaches it can be detached and discarded, if not will need to keep in until Tuesday at her appointment time. Patient voiced understanding.

## 2023-07-02 ENCOUNTER — Other Ambulatory Visit: Payer: Self-pay

## 2023-07-02 ENCOUNTER — Ambulatory Visit: Admitting: *Deleted

## 2023-07-02 VITALS — BP 125/84 | HR 91 | Ht 68.0 in | Wt 278.0 lb

## 2023-07-02 DIAGNOSIS — Z4889 Encounter for other specified surgical aftercare: Secondary | ICD-10-CM

## 2023-07-02 NOTE — Progress Notes (Signed)
 Pt presents for incision check following Rpt C/S & bilateral salpingectomy on 06/21/23. She reports feeling great - no pain. She denies H/A or visual disturbances since d/c from hospital on 5/19. She is taking nifedipine  as prescribed @ d/c. BP - 125/84, P - 91.  Pt instructed to continue nifedipine  until PP visit on 7/3 and will be informed if medication is still needed afterward. Wound vac removed and incision was found to be healing well. No redness, drainage or swelling noted. Pt was instructed on proper incision care and daily cleansing. She will return as scheduled on 7/3 for PP visit. Pt voiced understanding of all information and instructions given.

## 2023-07-03 ENCOUNTER — Ambulatory Visit: Payer: Self-pay

## 2023-07-03 NOTE — Lactation Note (Signed)
 This note was copied from a baby's chart. Lactation Consultation Note  Patient Name: Alicia Terry [id number] Date: 07/03/2023 Age:36 days    Lactation met w/ 12 day old baby girl "Alicia Terry" and mom today for a pre/post weight assessment.  [name] stated that she had a c/s delivery and infant went to NICU because of low blood sugar.  Infant was born at 9lbs 11.6oz  .  On Friday, May 23rd she had a weight check which was 9lbs 1.8oz.  Currently, Alicia Terry is receiving about 2oz of EBM and formula every 2 hrs.  Mom stated that her goal is to put infant to the breast but she doesn't have a strong suck.    Pre-weight 4304g  Post weight 4336g Diaper 18g  Milk transfer 14ml   Infant was put on the left breast in football hold.  Infant latched immediately.  Mom has good form at the breast and handling of infant.  LC made minor adjustments to bring infant closer in to help establish a deeper latch.  Infant ate for 9 minutes before slowing down.    LC recommendation;  Infant can but put to the breast, but mom will need to continue to offer her a bottle of EBM/formula.  LC informed mom that triple feeding can be a lot therefore, she can triple feed every other feed to not where herself out.   Feed at breast Pump Feed EBM/formula   [name] stated that she was ok with that.  LC informed mom that she is more than welcome to come back in a couple of weeks if she feels the need to do another pre/post weight check.      Thurl Boen S Filimon Miranda 07/03/2023, 3:46 PM

## 2023-07-28 ENCOUNTER — Ambulatory Visit
Admission: EM | Admit: 2023-07-28 | Discharge: 2023-07-28 | Disposition: A | Discharge status: Home / Self Care | Attending: Emergency Medicine | Admitting: Emergency Medicine

## 2023-07-28 ENCOUNTER — Encounter: Payer: Self-pay | Admitting: Emergency Medicine

## 2023-07-28 DIAGNOSIS — N898 Other specified noninflammatory disorders of vagina: Secondary | ICD-10-CM | POA: Diagnosis not present

## 2023-07-28 MED ORDER — METRONIDAZOLE 500 MG PO TABS: 500.0000 mg | ORAL_TABLET | Freq: Two times a day (BID) | ORAL | 0 refills | Status: DC

## 2023-07-28 NOTE — ED Provider Notes (Signed)
 Alicia Terry    CSN: [id number] Arrival date & time: 07/28/23  1400      History   Chief Complaint Chief Complaint  Patient presents with   Vaginitis    HPI Alicia Terry is a 33 y.o. female.   Patient presents for evaluation of vaginal irritation present for 2 days.  Recent menstruation, first after giving birth.  Has not resumed sexual activity, no concern for STI.  Denies vaginal itching, odor, abdominal or flank pain, discharge, urinary symptoms.  Has not attempted treatment.  Currently breast-feeding.  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, antepartum (VSD?) 03/29/2023   Supervision of high risk pregnancy, antepartum 12/11/2022   S/P cesarean section 01/23/2022   History of severe pre-eclampsia 01/05/2022   Excessive fetal growth affecting management of mother in third trimester, antepartum 01/01/2022   BMI 40.0-44.9, adult (HCC) 08/28/2021   early GDM/likely DM2 08/21/2021   Alpha thalassemia silent carrier 08/18/2021   Obesity in pregnancy 08/05/2020   History of IUFD 07/30/2020    Past Surgical History:  Procedure Laterality Date   CESAREAN SECTION N/A 01/23/2022   Procedure: CESAREAN SECTION;  Surgeon: Herchel Gloris DELENA, MD;  Location: [facility] ORS;  Service: Obstetrics;  Laterality: N/A;   CESAREAN SECTION WITH BILATERAL TUBAL LIGATION Bilateral 06/21/2023   Procedure: CESAREAN SECTION, WITH BILATERAL TUBAL LIGATION;  Surgeon: Kandis Devaughn Sayres, MD;  Location: [facility] ORS;  Service: Obstetrics;  Laterality: Bilateral;   CYST EXCISION     Chest   WISDOM TOOTH EXTRACTION       OB History     Gravida  4   Para  3   Term  2   Preterm  1   AB  1   Living  2      SAB  0   IAB  0   Ectopic  1   Multiple  0   Live Births  2            Home Medications    Prior to Admission medications   Medication Sig Start Date End Date Taking? Authorizing Provider  metroNIDAZOLE  (FLAGYL ) 500 MG tablet Take 1 tablet (500 mg total) by mouth 2 (two) times daily. 07/28/23  Yes Ora Mcnatt, Shelba SAUNDERS, NP  Continuous Glucose Sensor (DEXCOM G7 SENSOR) MISC Use one sensor every 10 days as instructed Patient not taking: Reported on 07/02/2023 04/24/23   Anyanwu, Ugonna A, MD  Ferric Maltol  30 MG CAPS Take 1 capsule (30 mg total) by mouth 2 (two) times daily. Please take one hour before breakfast and dinner 04/26/23   Anyanwu, Ugonna A, MD  glucose blood (ACCU-CHEK GUIDE TEST) test strip Use four times per day as instructed Patient not taking: Reported on 07/02/2023 01/16/23   Cresenzo, John V, MD  ibuprofen  (ADVIL ) 600 MG tablet Take 1 tablet (600 mg total) by mouth every 6 (six) hours. 06/24/23   Synthia Raisin, CNM  metFORMIN  (GLUCOPHAGE ) 500 MG tablet Take 1 tablet (500 mg total) by mouth 2 (two) times daily with a  meal. Patient taking differently: Take 1,000 mg by mouth 2 (two) times daily with a meal. 03/25/23   Eveline Lynwood MATSU, MD  NIFEdipine  (PROCARDIA  XL) 30 MG 24 hr tablet Take 1 tablet (30 mg total) by mouth daily. 06/24/23   Synthia Raisin, CNM  Prenatal Vit-Fe Fumarate-FA (PREPLUS) 27-1 MG TABS Take 1 tablet by mouth daily. 11/09/22   Jhonny Augustin BROCKS, MD    Family History Family History  Problem Relation Age of Onset   Diabetes Mother    Hypertension Mother    Healthy Father    Diabetes Sister    Heart disease Maternal Grandmother    Hypertension Maternal Grandmother    Depression Maternal Grandmother    Heart disease Maternal Grandfather    Hypertension Maternal Grandfather    Depression Maternal Grandfather    Dementia Paternal Grandmother     Social  History Social History   Tobacco Use   Smoking status: Never   Smokeless tobacco: Never  Vaping Use   Vaping status: Never Used  Substance Use Topics   Alcohol use: Not Currently   Drug use: Not Currently    Types: Marijuana    Comment: last use may 2023     Allergies   Patient has no known allergies.   Review of Systems Review of Systems   Physical Exam Triage Vital Signs ED Triage Vitals  Encounter Vitals Group     BP 07/28/23 1442 113/75     Girls Systolic BP Percentile --      Girls Diastolic BP Percentile --      Boys Systolic BP Percentile --      Boys Diastolic BP Percentile --      Pulse Rate 07/28/23 1442 87     Resp 07/28/23 1442 18     Temp 07/28/23 1442 98.5 F (36.9 C)     Temp Source 07/28/23 1442 Oral     SpO2 07/28/23 1442 97 %     Weight --      Height --      Head Circumference --      Peak Flow --      Pain Score 07/28/23 1445 0     Pain Loc --      Pain Education --      Exclude from Growth Chart --    No data found.  Updated Vital Signs BP 113/75 (BP Location: Left Arm)   Pulse 87   Temp 98.5 F (36.9 C) (Oral)   Resp 18   LMP 07/21/2023   SpO2 97%   Breastfeeding Yes   Visual Acuity Right Eye Distance:   Left Eye Distance:   Bilateral Distance:    Right Eye Near:   Left Eye Near:    Bilateral Near:     Physical Exam Constitutional:      Appearance: Normal appearance.   Eyes:     Extraocular Movements: Extraocular movements intact.   Abdominal:     Tenderness: There is no abdominal tenderness. There is no right CVA tenderness, left CVA tenderness or guarding.  Genitourinary:    Comments: deferred  Neurological:     Mental Status: She is alert.      UC Treatments / Results  Labs (all labs ordered are listed, but only abnormal results are displayed) Labs Reviewed - No data to display  EKG   Radiology No results found.  Procedures Procedures (including critical care time)  Medications Ordered in  UC Medications - No data to display  Initial  Impression / Assessment and Plan / UC Course  I have reviewed the triage vital signs and the nursing notes.  Pertinent labs & imaging results that were available during my care of the patient were reviewed by me and considered in my medical decision making (see chart for details).  Vaginal Irritation   Treating empirically for bacterial vaginosis, prescribed metronidazole , discussed administration, vaginal swab checking for yeast and BV pending, will treat additionally per protocol, recommended additional supportive measures Final Clinical Impressions(s) / UC Diagnoses   Final diagnoses:  Vaginal irritation     Discharge Instructions      Today you are being treated prophylactically for  Bacterial vaginosis   Take Metronidazole  500 mg twice a day for 7 days  Bacterial vaginosis which results from an overgrowth of one on several organisms that are normally present in your vagina. Vaginosis is an inflammation of the vagina that can result in discharge, itching and pain.  Labs pending 2-3 days, you will be contacted if positive results, checking for yeast and BV, will add additional medicine at time of notification  In addition: Avoid baths, hot tubs and whirlpool spas.  Don't use scented or harsh soaps Avoid irritants. These include scented tampons and pads. Wipe from front to back after using the toilet. Don't douche. Your vagina doesn't require cleansing other than normal bathing.  Use a condom.  Wear cotton underwear, this fabric absorbs some moisture.        ED Prescriptions     Medication Sig Dispense Auth. Provider   metroNIDAZOLE  (FLAGYL ) 500 MG tablet Take 1 tablet (500 mg total) by mouth 2 (two) times daily. 14 tablet Letetia Romanello R, NP      PDMP not reviewed this encounter.   Teresa Shelba SAUNDERS, NP 07/28/23 1500

## 2023-07-28 NOTE — Discharge Instructions (Signed)
 Today you are being treated prophylactically for  Bacterial vaginosis   Take Metronidazole  500 mg twice a day for 7 days  Bacterial vaginosis which results from an overgrowth of one on several organisms that are normally present in your vagina. Vaginosis is an inflammation of the vagina that can result in discharge, itching and pain.  Labs pending 2-3 days, you will be contacted if positive results, checking for yeast and BV, will add additional medicine at time of notification  In addition: Avoid baths, hot tubs and whirlpool spas.  Don't use scented or harsh soaps Avoid irritants. These include scented tampons and pads. Wipe from front to back after using the toilet. Don't douche. Your vagina doesn't require cleansing other than normal bathing.  Use a condom.  Wear cotton underwear, this fabric absorbs some moisture.

## 2023-07-28 NOTE — ED Triage Notes (Signed)
 Vaginal irritation x 2 days. Denies discharge at this time. Had a C-section on 06/21/2023.

## 2023-07-29 LAB — CERVICOVAGINAL ANCILLARY ONLY
Bacterial Vaginitis (gardnerella): POSITIVE — AB
Candida Glabrata: NEGATIVE
Candida Vaginitis: POSITIVE — AB
Comment: NEGATIVE
Comment: NEGATIVE
Comment: NEGATIVE

## 2023-07-30 ENCOUNTER — Ambulatory Visit (HOSPITAL_COMMUNITY): Payer: Self-pay

## 2023-07-30 MED ORDER — FLUCONAZOLE 150 MG PO TABS: 150.0000 mg | ORAL_TABLET | Freq: Once | ORAL | 0 refills | Status: AC

## 2023-08-06 ENCOUNTER — Other Ambulatory Visit: Payer: Self-pay

## 2023-08-06 ENCOUNTER — Ambulatory Visit: Payer: Self-pay | Admitting: Obstetrics & Gynecology

## 2023-08-08 ENCOUNTER — Ambulatory Visit: Admitting: Family Medicine

## 2023-08-08 ENCOUNTER — Encounter: Payer: Self-pay | Admitting: Family Medicine

## 2023-08-08 DIAGNOSIS — Z98891 History of uterine scar from previous surgery: Secondary | ICD-10-CM | POA: Diagnosis not present

## 2023-08-08 NOTE — Progress Notes (Deleted)
    Post Partum Visit Note  Alicia Terry is a 33 y.o. [phone] female who presents for a postpartum visit. She is 6 weeks 5 days postpartum following a repeat cesarean section on 06/21/23.  I have fully reviewed the prenatal and intrapartum course. The delivery was at 37 gestational weeks.  Anesthesia: spinal. Postpartum course has been ***. Baby is doing well***. Baby is feeding by {breastmilk/bottle:69}. Bleeding {vag bleed:12292}. Bowel function is {normal:32111}. Bladder function is {normal:32111}. Patient {is/is not:9024} sexually active. Contraception method is bilateral salpingectomy . Postpartum depression screening: {gen negative/positive:315881}.   The pregnancy intention screening data noted above was reviewed. Potential methods of contraception were discussed. The patient elected to proceed with No data recorded.    Health Maintenance Due  Topic Date Due   FOOT EXAM  Never done   OPHTHALMOLOGY EXAM  Never done   Diabetic kidney evaluation - Urine ACR  Never done   Hepatitis B Vaccines (1 of 3 - 19+ 3-dose series) Never done   HPV VACCINES (1 - 3-dose SCDM series) Never done   COVID-19 Vaccine (1 - 2024-25 season) Never done    {Common ambulatory SmartLinks:19316}  Review of Systems {ros; complete:30496}  Objective:  LMP 07/21/2023    General:  {gen appearance:16600}   Breasts:  {desc; normal/abnormal/not indicated:14647}  Lungs: {lung exam:16931}  Heart:  {heart exam:5510}  Abdomen: {abdomen exam:16834}   Wound {Wound assessment:11097}  GU exam:  {desc; normal/abnormal/not indicated:14647}       Assessment:    There are no diagnoses linked to this encounter.  *** postpartum exam.   Plan:   Essential components of care per ACOG recommendations:  1.  Mood and well being: Patient with {gen negative/positive:315881} depression screening today. Reviewed local resources for support.  - Patient tobacco use? {tobacco use:25506}  - hx of drug use?  {yes/no:25505}    2. Infant care and feeding:  -Patient currently breastmilk feeding? {yes/no:25502}  -Social determinants of health (SDOH) reviewed in EPIC. No concerns***The following needs were identified***  3. Sexuality, contraception and birth spacing - Patient {DOES_DOES WNU:81435} want a pregnancy in the next year.  Desired family size is {NUMBER 1-10:22536} children.  - Reviewed reproductive life planning. Reviewed contraceptive methods based on pt preferences and effectiveness.  Patient desired {Upstream End Methods:24109} today.   - Discussed birth spacing of 18 months  4. Sleep and fatigue -Encouraged family/partner/community support of 4 hrs of uninterrupted sleep to help with mood and fatigue  5. Physical Recovery  - Discussed patients delivery and complications. She describes her labor as {description:25511} - Patient had a {CHL AMB [phone]}. Patient had a {laceration:25518} laceration. Perineal healing reviewed. Patient expressed understanding - Patient has urinary incontinence? {yes/no:25515} - Patient {ACTION; IS/IS WNU:78978602} safe to resume physical and sexual activity  6.  Health Maintenance - HM due items addressed {Yes or If no, why not?:20788} - Last pap smear  Diagnosis  Date Value Ref Range Status  10/04/2020   Final   - Negative for intraepithelial lesion or malignancy (NILM)   Pap smear {done:10129} at today's visit.  -Breast Cancer screening indicated? {indicated:25516}  7. Chronic Disease/Pregnancy Condition follow up: {Follow up:25499}  - PCP follow up  Waddell LITTIE Burows, RN [facility] [employer], [facility]

## 2023-08-08 NOTE — Progress Notes (Addendum)
 Post Partum Visit Note  Alicia Terry is a 33 y.o. [phone] female who presents for a postpartum visit. She is 6 weeks postpartum following a repeat cesarean section.  I have fully reviewed the prenatal and intrapartum course. The delivery was at 37/0 gestational weeks.  Anesthesia: spinal. Postpartum course has been going well. Baby is doing well. Baby is feeding by breast. Bleeding moderate lochia. Bowel function is normal. Bladder function is normal. Patient is not sexually active. Contraception method is tubal ligation. Postpartum depression screening: negative.   The pregnancy intention screening data noted above was reviewed. Potential methods of contraception were discussed. The patient elected to proceed with No data recorded.   Edinburgh Postnatal Depression Scale - 08/08/23 0846       Edinburgh Postnatal Depression Scale:  In the Past 7 Days   I have been able to laugh and see the funny side of things. 0    I have looked forward with enjoyment to things. 0    I have blamed myself unnecessarily when things went wrong. 0    I have been anxious or worried for no good reason. 0    I have felt scared or panicky for no good reason. 0    Things have been getting on top of me. 0    I have been so unhappy that I have had difficulty sleeping. 0    I have felt sad or miserable. 0    I have been so unhappy that I have been crying. 0    The thought of harming myself has occurred to me. 0    Edinburgh Postnatal Depression Scale Total 0          Health Maintenance Due  Topic Date Due   FOOT EXAM  Never done   OPHTHALMOLOGY EXAM  Never done   Diabetic kidney evaluation - Urine ACR  Never done   Hepatitis B Vaccines (1 of 3 - 19+ 3-dose series) Never done   HPV VACCINES (1 - 3-dose SCDM series) Never done   COVID-19 Vaccine (1 - 2024-25 season) Never done    The following portions of the patient's history were reviewed and updated as appropriate: allergies, current  medications, past family history, past medical history, past social history, past surgical history, and problem list.  Review of Systems Pertinent items are noted in HPI.  Objective:  BP 129/88   Pulse 79   Ht 5' 8 (1.727 m)   Wt 278 lb 3.2 oz (126.2 kg)   LMP 07/21/2023   BMI 42.30 kg/m    General:  alert, cooperative, appears stated age, and no distress   Breasts:  normal  Lungs: clear to auscultation bilaterally  Heart:  regular rate and rhythm, S1, S2 normal, no murmur, click, rub or gallop  Abdomen: soft, non-tender; bowel sounds normal; no masses,  no organomegaly   Wound well approximated incision  GU exam:  not indicated       Assessment:   1. Postpartum care and examination (Primary)  2. S/P cesarean section Incision healed well   Normal postpartum exam.   Plan:   Essential components of care per ACOG recommendations:  1.  Mood and well being: Patient with negative depression screening today. Reviewed local resources for support.  - Patient tobacco use? No.   - hx of drug use? No.    2. Infant care and feeding:  -Patient currently breastmilk feeding? Yes. Reviewed importance of draining breast regularly to support lactation.  -  Social determinants of health (SDOH) reviewed in EPIC. No concerns.  3. Sexuality, contraception and birth spacing - Patient does not want a pregnancy in the next year.  Desired family size is 2 children.  - Reviewed reproductive life planning. Patient had tubal at time of C-section. - Menstrual cycle has returned.  4. Sleep and fatigue -Encouraged family/partner/community support of 4 hrs of uninterrupted sleep to help with mood and fatigue  5. Physical Recovery  - Discussed patients delivery and complications. She describes her labor as good. - Patient had a C-section repeat; no problems after deliver.  - Patient has urinary incontinence? No. - Patient is safe to resume physical and sexual activity  6.  Health Maintenance -  HM due items addressed Yes - Last pap smear  Diagnosis  Date Value Ref Range Status  10/04/2020   Final   - Negative for intraepithelial lesion or malignancy (NILM)   Pap smear not done at today's visit. Due 10/04/2025. -Breast Cancer screening indicated? No.   7. Chronic Disease/Pregnancy Condition follow up: gestational Hypertension and Gestational Diabetes gHTN: continued nifedipine  after hospital, BP well controlled now after being off of medication for about 2 weeks. gDM: continued metformin  500 mg BID after discharge. Will follow up with PCP end of July.  - PCP follow up  Joesph DELENA Sear, PA [facility] [employer], [facility]

## 2023-09-13 ENCOUNTER — Ambulatory Visit
Admission: EM | Admit: 2023-09-13 | Discharge: 2023-09-13 | Disposition: A | Discharge status: Home / Self Care | Attending: Emergency Medicine | Admitting: Emergency Medicine

## 2023-09-13 DIAGNOSIS — N898 Other specified noninflammatory disorders of vagina: Secondary | ICD-10-CM | POA: Insufficient documentation

## 2023-09-13 DIAGNOSIS — Z3202 Encounter for pregnancy test, result negative: Secondary | ICD-10-CM | POA: Diagnosis not present

## 2023-09-13 LAB — POCT URINE PREGNANCY: Preg Test, Ur: NEGATIVE

## 2023-09-13 MED ORDER — METRONIDAZOLE 500 MG PO TABS: 500.0000 mg | ORAL_TABLET | Freq: Two times a day (BID) | ORAL | 0 refills | Status: DC

## 2023-09-13 MED ORDER — FLUCONAZOLE 150 MG PO TABS: 150.0000 mg | ORAL_TABLET | Freq: Once | ORAL | 0 refills | Status: AC

## 2023-09-13 NOTE — Discharge Instructions (Addendum)
 Take the Diflucan  and Flagyl  as directed.  Your vaginal tests are pending.  Do not have sexual activity for at least 7 days.    Follow-up with your primary care provider or gynecologist.

## 2023-09-13 NOTE — ED Triage Notes (Addendum)
 Patient to Urgent Care with complaints of vaginal discharge (fishy odor).   Symptoms started yesterday. Reports recurrent BV and this feels the same.   Denies any concerns for STDs.

## 2023-09-13 NOTE — ED Provider Notes (Signed)
 Alicia Terry    CSN: [id number] Arrival date & time: 09/13/23  1602      History   Chief Complaint Chief Complaint  Patient presents with   Vaginal Discharge    HPI Alicia Terry is a 33 y.o. female.  Patient presents with 2-day history of malodorous vaginal discharge.  She reports history of recurrent bacterial vaginitis and yeast vaginitis.  She states her current vaginal discharge is her typical discharge when she has BV and yeast.  She denies any concern for STDs.  She denies fever, chills, abdominal pain, dysuria, hematuria, pelvic pain.  No treatments at home.  Patient states she is no longer breast-feeding.  The history is provided by the patient and medical records.    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   pos HSV 1 IgG   Pregnancy induced hypertension     Patient Active Problem List   Diagnosis Date Noted   S/P cesarean section 01/23/2022   History of severe pre-eclampsia 01/05/2022   BMI 40.0-44.9, adult (HCC) 08/28/2021   early GDM/likely DM2 08/21/2021   Alpha thalassemia silent carrier 08/18/2021   History of IUFD 07/30/2020    Past Surgical History:  Procedure Laterality Date   CESAREAN SECTION N/A 01/23/2022   Procedure: CESAREAN SECTION;  Surgeon: Herchel Gloris DELENA, MD;  Location: [facility] LD ORS;  Service: Obstetrics;  Laterality: N/A;   CESAREAN SECTION WITH BILATERAL TUBAL LIGATION Bilateral 06/21/2023   Procedure: CESAREAN SECTION, WITH BILATERAL TUBAL LIGATION;  Surgeon: Kandis Devaughn Sayres, MD;  Location: [facility] LD ORS;  Service: Obstetrics;  Laterality:  Bilateral;   CYST EXCISION     Chest   WISDOM TOOTH EXTRACTION      OB History     Gravida  4   Para  3   Term  2   Preterm  1   AB  1   Living  2      SAB  0   IAB  0   Ectopic  1   Multiple  0   Live Births  2            Home Medications    Prior to Admission medications   Medication Sig Start Date End Date Taking? Authorizing Provider  fluconazole  (DIFLUCAN ) 150 MG tablet Take 1 tablet (150 mg total) by mouth once for 1 dose. 09/13/23 09/13/23 Yes Corlis Burnard DEL, NP  metroNIDAZOLE  (FLAGYL ) 500 MG tablet Take 1 tablet (500 mg total) by mouth 2 (two) times daily. 09/13/23  Yes Corlis Burnard DEL, NP  Ferric Maltol  30 MG CAPS Take 1 capsule (30 mg total) by mouth 2 (two) times daily. Please take one hour before breakfast and dinner 04/26/23   Anyanwu, Ugonna A, MD  glucose blood (ACCU-CHEK GUIDE TEST) test strip Use four times per day as instructed 01/16/23   Cresenzo, John V, MD  ibuprofen  (ADVIL ) 600 MG tablet Take 1 tablet (600 mg total) by mouth every 6 (six) hours. Patient not taking: Reported on 08/08/2023 06/24/23   Synthia,  Harlene, CNM  metFORMIN  (GLUCOPHAGE ) 500 MG tablet Take 1 tablet (500 mg total) by mouth 2 (two) times daily with a meal. 03/25/23   Eveline Lynwood MATSU, MD  Prenatal Vit-Fe Fumarate-FA (PREPLUS) 27-1 MG TABS Take 1 tablet by mouth daily. 11/09/22   Jhonny Augustin BROCKS, MD    Family History Family History  Problem Relation Age of Onset   Diabetes Mother    Hypertension Mother    Healthy Father    Diabetes Sister    Heart disease Maternal Grandmother    Hypertension Maternal Grandmother    Depression Maternal Grandmother    Heart disease Maternal Grandfather    Hypertension Maternal Grandfather    Depression Maternal Grandfather    Dementia Paternal Grandmother     Social History Social History   Tobacco Use   Smoking status: Never   Smokeless tobacco: Never  Vaping Use   Vaping status: Never Used  Substance Use Topics   Alcohol use: Not  Currently   Drug use: Not Currently    Types: Marijuana    Comment: last use may 2023     Allergies   Patient has no known allergies.   Review of Systems Review of Systems  Constitutional:  Negative for chills and fever.  Gastrointestinal:  Negative for abdominal pain.  Genitourinary:  Positive for vaginal discharge. Negative for dysuria, hematuria and pelvic pain.  Skin:  Negative for color change and rash.     Physical Exam Triage Vital Signs ED Triage Vitals  Encounter Vitals Group     BP 09/13/23 1658 118/82     Girls Systolic BP Percentile --      Girls Diastolic BP Percentile --      Boys Systolic BP Percentile --      Boys Diastolic BP Percentile --      Pulse Rate 09/13/23 1658 83     Resp 09/13/23 1658 18     Temp 09/13/23 1658 97.8 F (36.6 C)     Temp src --      SpO2 09/13/23 1658 99 %     Weight --      Height --      Head Circumference --      Peak Flow --      Pain Score 09/13/23 1703 0     Pain Loc --      Pain Education --      Exclude from Growth Chart --    No data found.  Updated Vital Signs BP 118/82   Pulse 83   Temp 97.8 F (36.6 C)   Resp 18   LMP 09/03/2023   SpO2 99%   Breastfeeding Yes   Visual Acuity Right Eye Distance:   Left Eye Distance:   Bilateral Distance:    Right Eye Near:   Left Eye Near:    Bilateral Near:     Physical Exam Constitutional:      General: She is not in acute distress. HENT:     Mouth/Throat:     Mouth: Mucous membranes are moist.  Cardiovascular:     Rate and Rhythm: Normal rate and regular rhythm.  Pulmonary:     Effort: Pulmonary effort is normal. No respiratory distress.  Abdominal:     General: Bowel sounds are normal.     Palpations: Abdomen is soft.     Tenderness: There is no abdominal tenderness. There is no right CVA tenderness, left CVA tenderness, guarding or rebound.  Genitourinary:    Comments: Patient  declines GU exam. Neurological:     Mental Status: She is alert.       UC Treatments / Results  Labs (all labs ordered are listed, but only abnormal results are displayed) Labs Reviewed  POCT URINE PREGNANCY  CERVICOVAGINAL ANCILLARY ONLY    EKG   Radiology No results found.  Procedures Procedures (including critical care time)  Medications Ordered in UC Medications - No data to display  Initial Impression / Assessment and Plan / UC Course  I have reviewed the triage vital signs and the nursing notes.  Pertinent labs & imaging results that were available during my care of the patient were reviewed by me and considered in my medical decision making (see chart for details).    Vaginal discharge, negative pregnancy test.  Patient has history of recurrent BV and yeast.  She is no longer breast-feeding.  Her current vaginal discharge is her usual discharge when she has BV and yeast.  Treating today with Diflucan  and Flagyl .  Instructed patient to schedule an appointment with her PCP or gynecologist due to her recurrent symptoms.  Instructed her to abstain from sexual activity for at least 7 days.  Education provided on bacterial vaginitis and yeast.  She agrees to plan of care.  Final Clinical Impressions(s) / UC Diagnoses   Final diagnoses:  Vaginal discharge  Negative pregnancy test     Discharge Instructions      Take the Diflucan  and Flagyl  as directed.  Your vaginal tests are pending.  Do not have sexual activity for at least 7 days.    Follow-up with your primary care provider or gynecologist.       ED Prescriptions     Medication Sig Dispense Auth. Provider   metroNIDAZOLE  (FLAGYL ) 500 MG tablet Take 1 tablet (500 mg total) by mouth 2 (two) times daily. 14 tablet Corlis Burnard DEL, NP   fluconazole  (DIFLUCAN ) 150 MG tablet Take 1 tablet (150 mg total) by mouth once for 1 dose. 1 tablet Corlis Burnard DEL, NP      PDMP not reviewed this encounter.   Corlis Burnard DEL, NP 09/13/23 [phone]

## 2023-09-16 ENCOUNTER — Ambulatory Visit (HOSPITAL_COMMUNITY): Payer: Self-pay

## 2023-09-16 LAB — CERVICOVAGINAL ANCILLARY ONLY
Bacterial Vaginitis (gardnerella): POSITIVE — AB
Candida Glabrata: NEGATIVE
Candida Vaginitis: NEGATIVE
Comment: NEGATIVE
Comment: NEGATIVE
Comment: NEGATIVE

## 2024-02-15 DIAGNOSIS — R7309 Other abnormal glucose: Secondary | ICD-10-CM | POA: Insufficient documentation

## 2024-02-15 DIAGNOSIS — Z8632 Personal history of gestational diabetes: Secondary | ICD-10-CM | POA: Insufficient documentation

## 2024-02-15 NOTE — Patient Instructions (Signed)

## 2024-02-19 ENCOUNTER — Ambulatory Visit: Admitting: Nurse Practitioner

## 2024-02-19 ENCOUNTER — Encounter: Payer: Self-pay | Admitting: Nurse Practitioner

## 2024-02-19 VITALS — BP 126/86 | HR 82 | Temp 97.7°F | Resp 17 | Ht 67.99 in | Wt 290.2 lb

## 2024-02-19 DIAGNOSIS — E66813 Obesity, class 3: Secondary | ICD-10-CM

## 2024-02-19 DIAGNOSIS — Z6841 Body Mass Index (BMI) 40.0 and over, adult: Secondary | ICD-10-CM

## 2024-02-19 DIAGNOSIS — D649 Anemia, unspecified: Secondary | ICD-10-CM | POA: Diagnosis not present

## 2024-02-19 DIAGNOSIS — Z0000 Encounter for general adult medical examination without abnormal findings: Secondary | ICD-10-CM

## 2024-02-19 DIAGNOSIS — Z13220 Encounter for screening for lipoid disorders: Secondary | ICD-10-CM | POA: Diagnosis not present

## 2024-02-19 DIAGNOSIS — R7309 Other abnormal glucose: Secondary | ICD-10-CM

## 2024-02-19 DIAGNOSIS — Z136 Encounter for screening for cardiovascular disorders: Secondary | ICD-10-CM

## 2024-02-19 LAB — MICROALBUMIN, URINE WAIVED
Creatinine, Urine Waived: 200 mg/dL (ref 10–300)
Microalb, Ur Waived: 30 mg/L — ABNORMAL HIGH (ref 0–19)
Microalb/Creat Ratio: <30 mg/g

## 2024-02-19 LAB — BAYER DCA HB A1C WAIVED: HB A1C (BAYER DCA - WAIVED): 6.3 % — ABNORMAL HIGH (ref 4.8–5.6)

## 2024-02-19 NOTE — Progress Notes (Signed)
 "  BP 126/86 (BP Location: Left Arm, Patient Position: Sitting, Cuff Size: Large)   Pulse 82   Temp 97.7 F (36.5 C) (Oral)   Resp 17   Ht 5' 7.99 (1.727 m)   Wt 290 lb 3.2 oz (131.6 kg)   BMI 44.14 kg/m    Subjective:    Patient ID: Alicia Terry, female    DOB: 04-27-1990, 34 y.o.   MRN: [id number]  HPI: Alicia Terry is a 34 y.o. female presenting on 02/19/2024 for comprehensive medical examination. Current medical complaints include:none  She currently lives with:with kids Menopausal Symptoms: no  She is working out with her sister every morning at 7 am. She is a systems analyst.  PREDIABETES  Gave birth to second child on 06/21/23. She did have gestational diabetes and her last A1c was 04/24/23 at 8.3% -- was given Dexcom to check sugars at the time and Metformin  started + they tried to give her insulin  which made he dizzy. Prior to this they ranged from 5.5 to 6.5%. HbA1C:  Lab Results  Component Value Date   HGBA1C 8.3 (H) 04/24/2023  Duration of elevated blood sugar: chronic Polydipsia: no Polyuria: yes Weight change: no Visual disturbance: no Glucose Monitoring: no    Accucheck frequency: Not Checking    Fasting glucose:     Post prandial:  Diabetic Education: Not Completed Family history of diabetes: yes -- all mom's side  ANEMIA Noted on pregnancy labs, have not been rechecked since May 2025. Anemia status: stable Etiology of anemia: pregnancy Duration of anemia treatment: none at present Fatigue: yes Decreased exercise tolerance: no  Dyspnea on exertion: no Palpitations: no Bleeding: no Pica: only during pregnancy   Depression Screen done today and results listed below:     02/19/2024   11:15 AM 12/17/2022   11:06 AM 11/20/2021    3:23 PM 10/30/2021    4:22 PM 10/02/2021    3:02 PM  Depression screen PHQ 2/9  Decreased Interest 0 0 0 0 0  Down, Depressed, Hopeless 0 0 0 0 0  PHQ - 2 Score 0 0 0 0 0  Altered sleeping 1 1 0 0 0  Tired,  decreased energy 1 2 0 0 0  Change in appetite 1 0 0 0 0  Feeling bad or failure about yourself  0 0 0 0 0  Trouble concentrating 0 0 0 0 0  Moving slowly or fidgety/restless 0 0 0 0 0  Suicidal thoughts 0 0 0 0 0  PHQ-9 Score 3 3  0  0  0   Difficult doing work/chores Not difficult at all  Not difficult at all Not difficult at all      Data saved with a previous flowsheet row definition      02/19/2024   11:15 AM 12/17/2022   11:07 AM 11/20/2021    3:23 PM 10/30/2021    4:22 PM  GAD 7 : Generalized Anxiety Score  Nervous, Anxious, on Edge 0 0 0 0  Control/stop worrying 0 0 0 0  Worry too much - different things 0 0 0 0  Trouble relaxing 0 0 0 0  Restless 0 0 0 0  Easily annoyed or irritable 0 0 0 0  Afraid - awful might happen 0 0 0 0  Total GAD 7 Score 0 0 0 0  Anxiety Difficulty Not difficult at all  Not difficult at all Not difficult at all        01/23/2022  8:12 PM 01/24/2022   10:09 AM 01/24/2022    7:53 PM 01/25/2022    8:30 AM 02/19/2024   11:15 AM  Fall Risk  Falls in the past year?     0  Was there an injury with Fall?     0  Fall Risk Category Calculator     0  (RETIRED) Patient Fall Risk Level High fall risk  Moderate fall risk  Moderate fall risk  Moderate fall risk    Patient at Risk for Falls Due to     No Fall Risks  Fall risk Follow up     Falls evaluation completed     Data saved with a previous flowsheet row definition    Functional Status Survey: Is the patient deaf or have difficulty hearing?: No Does the patient have difficulty seeing, even when wearing glasses/contacts?: No Does the patient have difficulty concentrating, remembering, or making decisions?: No Does the patient have difficulty walking or climbing stairs?: No Does the patient have difficulty dressing or bathing?: No Does the patient have difficulty doing errands alone such as visiting a doctor's office or shopping?: No    BMI Metric Follow Up - 02/19/24 1113       BMI Metric  Follow Up-Please document annually   BMI Metric Follow Up Nutrition counseling           Past Medical History:  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, antepartum (VSD?) 03/29/2023   [ ]  needs postnatal echo for small muscular VSD on 2/17 fetal echo  2/17: FE at [facility] shows sm muscular VSD     BV (bacterial vaginosis)    Chlamydia    Ectopic pregnancy    Gestational diabetes    History of C-section 2025   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   pos HSV 1 IgG   Pregnancy induced hypertension     Surgical History:  Past Surgical History:  Procedure Laterality Date   CESAREAN SECTION N/A 01/23/2022   Procedure: CESAREAN SECTION;  Surgeon: Herchel Gloris LABOR, MD;  Location: [facility] LD ORS;  Service: Obstetrics;  Laterality: N/A;   CESAREAN SECTION WITH BILATERAL TUBAL LIGATION Bilateral 06/21/2023   Procedure: CESAREAN SECTION, WITH BILATERAL TUBAL LIGATION;  Surgeon: Kandis Devaughn Sayres, MD;  Location: [facility] LD ORS;  Service: Obstetrics;  Laterality: Bilateral;   CYST EXCISION     Chest   WISDOM TOOTH EXTRACTION      Medications:  Current Outpatient Medications on File Prior to Visit  Medication Sig   Ferric Maltol  30 MG CAPS Take 1 capsule (30 mg total) by mouth 2 (two) times daily. Please take one hour before breakfast and dinner   glucose blood (ACCU-CHEK GUIDE TEST) test strip Use four times per day as instructed (Patient not taking: Reported on 02/19/2024)   ibuprofen  (ADVIL ) 600 MG tablet Take 1 tablet (600 mg total) by mouth every 6 (six) hours. (Patient not taking: Reported on 02/19/2024)   metFORMIN  (GLUCOPHAGE ) 500 MG tablet Take 1 tablet (500 mg total) by mouth 2 (two) times daily with a meal. (Patient not taking: Reported on 02/19/2024)   metroNIDAZOLE  (FLAGYL ) 500 MG tablet Take 1 tablet (500 mg total) by mouth 2 (two) times daily. (Patient not taking: Reported on 02/19/2024)   Prenatal Vit-Fe  Fumarate-FA (PREPLUS) 27-1 MG TABS Take 1 tablet by mouth daily. (Patient not taking: Reported on 02/19/2024)   No current  facility-administered medications on file prior to visit.    Allergies:  Allergies[1]  Social History:  Social History   Socioeconomic History   Marital status: Single    Spouse name: Not on file   Number of children: Not on file   Years of education: Not on file   Highest education level: Not on file  Occupational History   Occupation: Beautician  Tobacco Use   Smoking status: Never   Smokeless tobacco: Never  Vaping Use   Vaping status: Never Used  Substance and Sexual Activity   Alcohol use: Not Currently   Drug use: Not Currently    Types: Marijuana    Comment: last use may 2023   Sexual activity: Yes    Comment: sexually active yesterday  Other Topics Concern   Not on file  Social History Narrative   Not on file   Social Drivers of Health   Tobacco Use: Low Risk (02/19/2024)   Patient History    Smoking Tobacco Use: Never    Smokeless Tobacco Use: Never    Passive Exposure: Not on file  Financial Resource Strain: Not on file  Food Insecurity: No Food Insecurity (06/21/2023)   Hunger Vital Sign    Worried About Running Out of Food in the Last Year: Never true    Ran Out of Food in the Last Year: Never true  Transportation Needs: No Transportation Needs (06/21/2023)   PRAPARE - Administrator, Civil Service (Medical): No    Lack of Transportation (Non-Medical): No  Physical Activity: Not on file  Stress: Not on file  Social Connections: Not on file  Intimate Partner Violence: Not At Risk (06/21/2023)   Humiliation, Afraid, Rape, and Kick questionnaire    Fear of Current or Ex-Partner: No    Emotionally Abused: No    Physically Abused: No    Sexually Abused: No  Depression (PHQ2-9): Low Risk (02/19/2024)   Depression (PHQ2-9)    PHQ-2 Score: 3  Alcohol Screen: Not on file  Housing: Low Risk (06/23/2023)   Housing Stability  Vital Sign    Unable to Pay for Housing in the Last Year: No    Number of Times Moved in the Last Year: 0    Homeless in the Last Year: No  Utilities: Not At Risk (06/21/2023)   AHC Utilities    Threatened with loss of utilities: No  Health Literacy: Not on file   Tobacco Use History[2] Social History   Substance and Sexual Activity  Alcohol Use Not Currently    Family History:  Family History  Problem Relation Age of Onset   Diabetes Mother    Hypertension Mother    Healthy Father    Diabetes Sister    Heart disease Maternal Grandmother    Hypertension Maternal Grandmother    Depression Maternal Grandmother    Heart disease Maternal Grandfather    Hypertension Maternal Grandfather    Depression Maternal Grandfather    Dementia Paternal Grandmother    Past medical history, surgical history, medications, allergies, family history and social history reviewed with patient today and changes made to appropriate areas of the chart.   ROS All other ROS negative except what is listed above and in the HPI.      Objective:    BP 126/86 (BP Location: Left Arm, Patient Position: Sitting, Cuff Size: Large)   Pulse 82   Temp 97.7 F (36.5 C) (Oral)   Resp 17   Ht 5' 7.99 (1.727 m)  Wt 290 lb 3.2 oz (131.6 kg)   BMI 44.14 kg/m   Wt Readings from Last 3 Encounters:  02/19/24 290 lb 3.2 oz (131.6 kg)  08/08/23 278 lb 3.2 oz (126.2 kg)  07/02/23 278 lb (126.1 kg)    Physical Exam Vitals and nursing note reviewed.  Constitutional:      General: She is awake. She is not in acute distress.    Appearance: She is well-developed and well-groomed. She is obese. She is not ill-appearing or toxic-appearing.  HENT:     Head: Normocephalic and atraumatic.     Right Ear: Hearing, tympanic membrane, ear canal and external ear normal. No drainage.     Left Ear: Hearing, tympanic membrane, ear canal and external ear normal. No drainage.     Nose: Nose normal.     Right Sinus: No  maxillary sinus tenderness or frontal sinus tenderness.     Left Sinus: No maxillary sinus tenderness or frontal sinus tenderness.     Mouth/Throat:     Mouth: Mucous membranes are moist.     Pharynx: Oropharynx is clear. Uvula midline. No pharyngeal swelling, oropharyngeal exudate or posterior oropharyngeal erythema.  Eyes:     General: Lids are normal.        Right eye: No discharge.        Left eye: No discharge.     Extraocular Movements: Extraocular movements intact.     Conjunctiva/sclera: Conjunctivae normal.     Pupils: Pupils are equal, round, and reactive to light.     Visual Fields: Right eye visual fields normal and left eye visual fields normal.  Neck:     Thyroid : No thyromegaly.     Vascular: No carotid bruit.     Trachea: Trachea normal.  Cardiovascular:     Rate and Rhythm: Normal rate and regular rhythm.     Heart sounds: Normal heart sounds. No murmur heard.    No gallop.  Pulmonary:     Effort: Pulmonary effort is normal. No accessory muscle usage or respiratory distress.     Breath sounds: Normal breath sounds.  Chest:     Comments: Deferred, follows with GYN. Abdominal:     General: Bowel sounds are normal.     Palpations: Abdomen is soft. There is no hepatomegaly or splenomegaly.     Tenderness: There is no abdominal tenderness.  Musculoskeletal:        General: Normal range of motion.     Cervical back: Normal range of motion and neck supple.     Right lower leg: No edema.     Left lower leg: No edema.  Lymphadenopathy:     Head:     Right side of head: No submental, submandibular, tonsillar, preauricular or posterior auricular adenopathy.     Left side of head: No submental, submandibular, tonsillar, preauricular or posterior auricular adenopathy.     Cervical: No cervical adenopathy.  Skin:    General: Skin is warm and dry.     Capillary Refill: Capillary refill takes less than 2 seconds.     Findings: No rash.  Neurological:     Mental Status:  She is alert and oriented to person, place, and time.     Gait: Gait is intact.     Deep Tendon Reflexes: Reflexes are normal and symmetric.     Reflex Scores:      Brachioradialis reflexes are 2+ on the right side and 2+ on the left side.      Patellar  reflexes are 2+ on the right side and 2+ on the left side. Psychiatric:        Attention and Perception: Attention normal.        Mood and Affect: Mood normal.        Speech: Speech normal.        Behavior: Behavior normal. Behavior is cooperative.        Thought Content: Thought content normal.        Judgment: Judgment normal.    Results for orders placed or performed during the hospital encounter of 09/13/23  POCT urine pregnancy   Collection Time: 09/13/23  5:37 PM  Result Value Ref Range   Preg Test, Ur Negative Negative  Cervicovaginal ancillary only   Collection Time: 09/13/23  5:38 PM  Result Value Ref Range   Bacterial Vaginitis (gardnerella) Positive (A)    Candida Vaginitis Negative    Candida Glabrata Negative    Comment      Normal Reference Range Bacterial Vaginosis - Negative   Comment Normal Reference Range Candida Species - Negative    Comment Normal Reference Range Candida Galbrata - Negative       Assessment & Plan:   Problem List Items Addressed This Visit       Other   Obesity - Primary   BMI 44.14. She is working out with her sister. Recommended eating smaller high protein, low fat meals more frequently and exercising 30 mins a day 5 times a week with a goal of 10-15lb weight loss in the next 3 months. Patient voiced their understanding and motivation to adhere to these recommendations.       Low hemoglobin   Noted on labs after delivery. Recheck CBC today, along with iron  and ferritin. Initiate supplement as needed.      Relevant Orders   CBC with Differential/Platelet   Ferritin   Iron    Elevated hemoglobin A1c measurement   History of gestational diabetes when A1c did trend up to 8.3% and  she was placed on Metformin , this was stopped 6 weeks after she gave birth. At this time A1c remains in prediabetic range at 6.3%, similar to past checks prior to pregnancy. Will continue to monitor this and initiate treatment as needed. Plan to recheck in 6 months. She wishes to focus on working out with her sister and diet at this time. Urine ALB 06 March 2024.      Relevant Orders   Bayer DCA Hb A1c Waived   Microalbumin, Urine Waived   Comprehensive metabolic panel with GFR   BMI 59.9-55.0, adult (HCC)   Refer to obesity plan of care.      Relevant Orders   CBC with Differential/Platelet   TSH   Other Visit Diagnoses       Encounter for lipid screening for cardiovascular disease       Lipid check today.   Relevant Orders   Comprehensive metabolic panel with GFR   Lipid Panel w/o Chol/HDL Ratio     Encounter for annual physical exam       Annual physical today with labs and health maintenance reviewed, discussed with patient.        Follow up plan: Return in about 6 months (around 08/18/2024) for PREDIABETES.   LABORATORY TESTING:  - Pap smear: up to date  IMMUNIZATIONS:   - Tdap: Tetanus vaccination status reviewed: last tetanus booster within 10 years. - Influenza: Refused - Pneumovax: Refused - Prevnar: Refused - COVID: Refused - HPV: Refused -  Shingrix vaccine: Not applicable  SCREENING: -Mammogram: Not applicable  - Colonoscopy: Not applicable  - Bone Density: Not applicable  -Hearing Test: Not applicable  -Spirometry: Not applicable   PATIENT COUNSELING:   Advised to take 1 mg of folate supplement per day if capable of pregnancy.   Sexuality: Discussed sexually transmitted diseases, partner selection, use of condoms, avoidance of unintended pregnancy  and contraceptive alternatives.   Advised to avoid cigarette smoking.  I discussed with the patient that most people either abstain from alcohol or drink within safe limits (<=14/week and <=4  drinks/occasion for males, <=7/weeks and <= 3 drinks/occasion for females) and that the risk for alcohol disorders and other health effects rises proportionally with the number of drinks per week and how often a drinker exceeds daily limits.  Discussed cessation/primary prevention of drug use and availability of treatment for abuse.   Diet: Encouraged to adjust caloric intake to maintain  or achieve ideal body weight, to reduce intake of dietary saturated fat and total fat, to limit sodium intake by avoiding high sodium foods and not adding table salt, and to maintain adequate dietary potassium and calcium preferably from fresh fruits, vegetables, and low-fat dairy products.    Stressed the importance of regular exercise  Injury prevention: Discussed safety belts, safety helmets, smoke detector, smoking near bedding or upholstery.   Dental health: Discussed importance of regular tooth brushing, flossing, and dental visits.    NEXT PREVENTATIVE PHYSICAL DUE IN 1 YEAR. Return in about 6 months (around 08/18/2024) for PREDIABETES.              [1] No Known Allergies [2]  Social History Tobacco Use  Smoking Status Never  Smokeless Tobacco Never   "

## 2024-02-19 NOTE — Assessment & Plan Note (Addendum)
 History of gestational diabetes when A1c did trend up to 8.3% and she was placed on Metformin , this was stopped 6 weeks after she gave birth. At this time A1c remains in prediabetic range at 6.3%, similar to past checks prior to pregnancy. Will continue to monitor this and initiate treatment as needed. Plan to recheck in 6 months. She wishes to focus on working out with her sister and diet at this time. Urine ALB 06 March 2024.

## 2024-02-19 NOTE — Assessment & Plan Note (Signed)
 Noted on labs after delivery. Recheck CBC today, along with iron  and ferritin. Initiate supplement as needed.

## 2024-02-19 NOTE — Assessment & Plan Note (Signed)
 BMI 44.14. She is working out with her sister. Recommended eating smaller high protein, low fat meals more frequently and exercising 30 mins a day 5 times a week with a goal of 10-15lb weight loss in the next 3 months. Patient voiced their understanding and motivation to adhere to these recommendations.

## 2024-02-19 NOTE — Assessment & Plan Note (Signed)
 Refer to obesity plan of care. ?

## 2024-02-20 ENCOUNTER — Ambulatory Visit: Payer: Self-pay | Admitting: Nurse Practitioner

## 2024-02-20 LAB — CBC WITH DIFFERENTIAL/PLATELET
Basophils Absolute: 0 x10E3/uL (ref 0.0–0.2)
Basos: 1 %
EOS (ABSOLUTE): 0.2 x10E3/uL (ref 0.0–0.4)
Eos: 3 %
Hematocrit: 36.5 % (ref 34.0–46.6)
Hemoglobin: 11.5 g/dL (ref 11.1–15.9)
Immature Grans (Abs): 0 x10E3/uL (ref 0.0–0.1)
Immature Granulocytes: 0 %
Lymphocytes Absolute: 1.9 x10E3/uL (ref 0.7–3.1)
Lymphs: 32 %
MCH: 25.7 pg — ABNORMAL LOW (ref 26.6–33.0)
MCHC: 31.5 g/dL (ref 31.5–35.7)
MCV: 82 fL (ref 79–97)
Monocytes Absolute: 0.4 x10E3/uL (ref 0.1–0.9)
Monocytes: 7 %
Neutrophils Absolute: 3.5 x10E3/uL (ref 1.4–7.0)
Neutrophils: 57 %
Platelets: 313 x10E3/uL (ref 150–450)
RBC: 4.48 x10E6/uL (ref 3.77–5.28)
RDW: 13.4 % (ref 11.7–15.4)
WBC: 6 x10E3/uL (ref 3.4–10.8)

## 2024-02-20 LAB — COMPREHENSIVE METABOLIC PANEL WITH GFR
ALT: 24 IU/L (ref 0–32)
AST: 13 IU/L (ref 0–40)
Albumin: 4.4 g/dL (ref 3.9–4.9)
Alkaline Phosphatase: 51 IU/L (ref 41–116)
BUN/Creatinine Ratio: 11 (ref 9–23)
BUN: 9 mg/dL (ref 6–20)
Bilirubin Total: 0.2 mg/dL (ref 0.0–1.2)
CO2: 19 mmol/L — ABNORMAL LOW (ref 20–29)
Calcium: 9.5 mg/dL (ref 8.7–10.2)
Chloride: 102 mmol/L (ref 96–106)
Creatinine, Ser: 0.83 mg/dL (ref 0.57–1.00)
Globulin, Total: 2.2 g/dL (ref 1.5–4.5)
Glucose: 126 mg/dL — ABNORMAL HIGH (ref 70–99)
Potassium: 4.3 mmol/L (ref 3.5–5.2)
Sodium: 137 mmol/L (ref 134–144)
Total Protein: 6.6 g/dL (ref 6.0–8.5)
eGFR: 94 mL/min/1.73

## 2024-02-20 LAB — IRON: Iron: 43 ug/dL (ref 27–159)

## 2024-02-20 LAB — LIPID PANEL W/O CHOL/HDL RATIO
Cholesterol, Total: 165 mg/dL (ref 100–199)
HDL: 49 mg/dL
LDL Chol Calc (NIH): 105 mg/dL — ABNORMAL HIGH (ref 0–99)
Triglycerides: 52 mg/dL (ref 0–149)
VLDL Cholesterol Cal: 11 mg/dL (ref 5–40)

## 2024-02-20 LAB — TSH: TSH: 0.976 u[IU]/mL (ref 0.450–4.500)

## 2024-02-20 LAB — FERRITIN: Ferritin: 32 ng/mL (ref 15–150)

## 2024-02-20 NOTE — Progress Notes (Signed)
 Contacted via MyChart  Good morning Shriya, your labs have returned: - CBC is overall improved and stable. Iron  is on low side of normal, I would recommend adding an iron  supplement like Slow Fe or Vitron to regimen daily to maintain level. - Kidney function, creatinine and eGFR, remains normal, as is liver function, AST and ALT.  - LDL, bad cholesterol, is a little elevated. For this focus on healthy diet changes and regular exercise. - Thyroid  lab, TSH, is normal. Any questions? Keep being amazing!!  Thank you for allowing me to participate in your care.  I appreciate you. Kindest regards, Albeiro Trompeter

## 2024-02-23 ENCOUNTER — Encounter: Payer: Self-pay | Admitting: Emergency Medicine

## 2024-02-23 ENCOUNTER — Ambulatory Visit
Admission: EM | Admit: 2024-02-23 | Discharge: 2024-02-23 | Disposition: A | Discharge status: Home / Self Care | Attending: Emergency Medicine | Admitting: Emergency Medicine

## 2024-02-23 DIAGNOSIS — N898 Other specified noninflammatory disorders of vagina: Secondary | ICD-10-CM | POA: Diagnosis present

## 2024-02-23 MED ORDER — FLUCONAZOLE 150 MG PO TABS: 150.0000 mg | ORAL_TABLET | ORAL | 0 refills | Status: AC

## 2024-02-23 MED ORDER — METRONIDAZOLE 500 MG PO TABS: 500.0000 mg | ORAL_TABLET | Freq: Two times a day (BID) | ORAL | 0 refills | Status: AC

## 2024-02-23 NOTE — Discharge Instructions (Signed)
 Today you are being treated for  Bacterial vaginosis and yeast  You may continue Benadryl  as needed for vaginal swelling  Take Metronidazole  500 mg twice a day for 7 days, do not drink alcohol while using medication, this will make you feel sick   Take 1 Diflucan  tablet when you receive your medicine  Take second dose in 1 week after completion of oral antibiotic to prevent new yeast from forming  Labs pending 2-3 days, you will be contacted if positive for any sti and treatment will be sent to the pharmacy, you will have to return to the clinic if positive for gonorrhea to receive treatment   Please refrain from having sex until labs results, if positive please refrain from having sex until treatment complete and symptoms resolve   If positive for  Chlamydia  gonorrhea or trichomoniasis please notify partner or partners so they may tested as well  In addition: Avoid baths, hot tubs and whirlpool spas.  Don't use scented or harsh soaps Avoid irritants. These include scented tampons and pads. Wipe from front to back after using the toilet. Don't douche. Your vagina doesn't require cleansing other than normal bathing.  Use a condom.  Wear cotton underwear, this fabric absorbs some moisture.

## 2024-02-23 NOTE — ED Provider Notes (Signed)
 " Alicia Terry    CSN: [id number] Arrival date & time: 02/23/24  1252      History   Chief Complaint Chief Complaint  Patient presents with   Vaginal Itching   Vaginal Discharge    HPI AVEY Terry is a 34 y.o. female.   Patient presents for evaluation of vaginal discharge with a mild odorous odor and vaginal itching beginning 1 day ago starting after she completed a vaginal wax with a different product use that had frequency.  At that time had vaginal swelling which she believed to be allergic reaction to the wet, able to improve with oral and topical Benadryl .  History of reoccurring bacterial vaginosis and yeast.  Sexually active, no known exposure.  Last menstruation 02/03/2024.  Denies abdominal or flank pain, urinary symptoms.   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   pos HSV 1 IgG   Pregnancy induced hypertension     Patient Active Problem List   Diagnosis Date Noted   Elevated hemoglobin A1c measurement 02/15/2024   History of gestational diabetes 02/15/2024   S/P cesarean section 01/23/2022   History of severe pre-eclampsia 01/05/2022   BMI 40.0-44.9, adult (HCC) 08/28/2021   Alpha thalassemia silent carrier 08/18/2021   Obesity 08/05/2020   History of IUFD 07/30/2020   Low hemoglobin 07/30/2020    Past Surgical History:  Procedure Laterality Date   CESAREAN SECTION N/A 01/23/2022   Procedure: CESAREAN SECTION;  Surgeon: Herchel Gloris DELENA, MD;  Location: [facility] ORS;  Service: Obstetrics;  Laterality: N/A;   CESAREAN SECTION WITH BILATERAL TUBAL LIGATION Bilateral  06/21/2023   Procedure: CESAREAN SECTION, WITH BILATERAL TUBAL LIGATION;  Surgeon: Kandis Devaughn Sayres, MD;  Location: [facility];  Service: Obstetrics;  Laterality: Bilateral;   CYST EXCISION     Chest   WISDOM TOOTH EXTRACTION      OB History     Gravida  4   Para  3   Term  2   Preterm  1   AB  1   Living  2      SAB  0   IAB  0   Ectopic  1   Multiple  0   Live Births  2            Home Medications    Prior to Admission medications  Medication Sig Start Date End Date Taking? Authorizing Provider  Ferric Maltol  30 MG CAPS Take 1 capsule (30 mg total) by mouth 2 (two) times daily. Please take one hour before breakfast and dinner Patient taking differently: Take 1 capsule by mouth daily. Please take one hour before breakfast and dinner 04/26/23   Anyanwu, Ugonna A, MD  glucose blood (ACCU-CHEK GUIDE TEST) test strip Use four times per day as instructed Patient not taking: Reported on 02/19/2024 01/16/23   Cresenzo, John V, MD  ibuprofen  (ADVIL ) 600 MG tablet Take 1 tablet (600 mg total) by mouth every 6 (six) hours. Patient not taking: Reported on 02/19/2024 06/24/23   Synthia,  Harlene, CNM  metFORMIN  (GLUCOPHAGE ) 500 MG tablet Take 1 tablet (500 mg total) by mouth 2 (two) times daily with a meal. Patient not taking: Reported on 02/19/2024 03/25/23   Eveline Lynwood MATSU, MD  metroNIDAZOLE  (FLAGYL ) 500 MG tablet Take 1 tablet (500 mg total) by mouth 2 (two) times daily. Patient not taking: Reported on 02/19/2024 09/13/23   Corlis Burnard DEL, NP  Prenatal Vit-Fe Fumarate-FA (PREPLUS) 27-1 MG TABS Take 1 tablet by mouth daily. Patient not taking: Reported on 02/19/2024 11/09/22   Jhonny Augustin BROCKS, MD    Family History Family History  Problem Relation Age of Onset   Diabetes Mother    Hypertension Mother    Healthy Father    Diabetes Sister    Heart disease Maternal Grandmother    Hypertension Maternal Grandmother    Depression Maternal Grandmother    Heart disease Maternal  Grandfather    Hypertension Maternal Grandfather    Depression Maternal Grandfather    Dementia Paternal Grandmother     Social History Social History[1]   Allergies   Patient has no known allergies.   Review of Systems Review of Systems   Physical Exam Triage Vital Signs ED Triage Vitals  Encounter Vitals Group     BP 02/23/24 1303 138/79     Girls Systolic BP Percentile --      Girls Diastolic BP Percentile --      Boys Systolic BP Percentile --      Boys Diastolic BP Percentile --      Pulse Rate 02/23/24 1303 100     Resp 02/23/24 1303 18     Temp 02/23/24 1303 98.1 F (36.7 C)     Temp src --      SpO2 02/23/24 1303 98 %     Weight --      Height --      Head Circumference --      Peak Flow --      Pain Score 02/23/24 1306 0     Pain Loc --      Pain Education --      Exclude from Growth Chart --    No data found.  Updated Vital Signs BP 138/79 (BP Location: Right Arm)   Pulse 100   Temp 98.1 F (36.7 C)   Resp 18   LMP 02/03/2024 (Exact Date)   SpO2 98%   Breastfeeding No   Visual Acuity Right Eye Distance:   Left Eye Distance:   Bilateral Distance:    Right Eye Near:   Left Eye Near:    Bilateral Near:     Physical Exam Constitutional:      Appearance: Normal appearance.  Eyes:     Extraocular Movements: Extraocular movements intact.  Pulmonary:     Effort: Pulmonary effort is normal.  Genitourinary:    Comments: deferred Neurological:     Mental Status: She is alert and oriented to person, place, and time.      UC Treatments / Results  Labs (all labs ordered are listed, but only abnormal results are displayed) Labs Reviewed - No data to display  EKG   Radiology No results found.  Procedures Procedures (including critical care time)  Medications Ordered in UC Medications - No data to display  Initial Impression / Assessment and Plan / UC Course  I have reviewed the triage vital signs and the nursing  notes.  Pertinent labs & imaging results that were available during my care of the patient were  reviewed by me and considered in my medical decision making (see chart for details).  Vaginal discharge  Treating empirically for BV and yeast due to history of reoccurrence, prescribed metronidazole  and Diflucan  discussed administration, advised against use of alcohol or any form of sexual activity during treatment, advised continuation of antihistamines for management of swelling as it has been effective, STI labs pending will treat per protocol, advised abstinence until lab results, and/or treatment is complete, advised condom use during all sexual encounters moving, may follow-up with urgent care as needed  Final Clinical Impressions(s) / UC Diagnoses   Final diagnoses:  None   Discharge Instructions   None    ED Prescriptions   None    PDMP not reviewed this encounter.     [1]  Social History Tobacco Use   Smoking status: Never   Smokeless tobacco: Never  Vaping Use   Vaping status: Never Used  Substance Use Topics   Alcohol use: Not Currently   Drug use: Not Currently    Types: Marijuana    Comment: last use may 2023     Teresa Shelba SAUNDERS, NP 02/23/24 1327  "

## 2024-02-23 NOTE — ED Triage Notes (Signed)
 Patient complains of vaginal odor, white discharge and itching. Patient reports she had vaginal waxing done yesterday and had allergic reaction to wax. Patient used benadryl  cream and pill  yesterday.

## 2024-02-25 ENCOUNTER — Ambulatory Visit (HOSPITAL_COMMUNITY): Payer: Self-pay

## 2024-02-25 LAB — CERVICOVAGINAL ANCILLARY ONLY
Bacterial Vaginitis (gardnerella): POSITIVE — AB
Candida Glabrata: NEGATIVE
Candida Vaginitis: NEGATIVE
Chlamydia: NEGATIVE
Comment: NEGATIVE
Comment: NEGATIVE
Comment: NEGATIVE
Comment: NEGATIVE
Comment: NEGATIVE
Comment: NORMAL
Neisseria Gonorrhea: NEGATIVE
Trichomonas: NEGATIVE

## 2024-08-18 ENCOUNTER — Ambulatory Visit: Admitting: Nurse Practitioner
